# Patient Record
Sex: Male | Born: 1990 | Race: White | Hispanic: No | Marital: Single | Smoking: Current every day smoker
Health system: Southern US, Community
[De-identification: ages and names within clinical notes are randomized; demographics above are authoritative.]

## PROBLEM LIST (undated history)

## (undated) ENCOUNTER — Emergency Department (HOSPITAL_COMMUNITY): Admission: EM | Payer: Self-pay | Source: Home / Self Care

## (undated) DIAGNOSIS — F209 Schizophrenia, unspecified: Secondary | ICD-10-CM

## (undated) DIAGNOSIS — F1910 Other psychoactive substance abuse, uncomplicated: Secondary | ICD-10-CM

## (undated) DIAGNOSIS — F32A Depression, unspecified: Secondary | ICD-10-CM

## (undated) DIAGNOSIS — F419 Anxiety disorder, unspecified: Secondary | ICD-10-CM

## (undated) HISTORY — PX: OTHER SURGICAL HISTORY: SHX169

## (undated) HISTORY — DX: Anxiety disorder, unspecified: F41.9

## (undated) HISTORY — DX: Depression, unspecified: F32.A

---
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
## 2013-11-18 ENCOUNTER — Encounter (HOSPITAL_COMMUNITY): Payer: Self-pay | Admitting: Emergency Medicine

## 2013-11-18 ENCOUNTER — Emergency Department (HOSPITAL_COMMUNITY)
Admission: EM | Admit: 2013-11-18 | Discharge: 2013-11-18 | Disposition: A | Discharge status: Home / Self Care | Payer: Self-pay | Attending: Emergency Medicine | Admitting: Emergency Medicine

## 2013-11-18 DIAGNOSIS — Z8659 Personal history of other mental and behavioral disorders: Secondary | ICD-10-CM | POA: Insufficient documentation

## 2013-11-18 DIAGNOSIS — Z046 Encounter for general psychiatric examination, requested by authority: Secondary | ICD-10-CM

## 2013-11-18 DIAGNOSIS — F1994 Other psychoactive substance use, unspecified with psychoactive substance-induced mood disorder: Secondary | ICD-10-CM

## 2013-11-18 DIAGNOSIS — F1210 Cannabis abuse, uncomplicated: Secondary | ICD-10-CM | POA: Insufficient documentation

## 2013-11-18 DIAGNOSIS — F17200 Nicotine dependence, unspecified, uncomplicated: Secondary | ICD-10-CM | POA: Insufficient documentation

## 2013-11-18 DIAGNOSIS — F489 Nonpsychotic mental disorder, unspecified: Secondary | ICD-10-CM | POA: Insufficient documentation

## 2013-11-18 HISTORY — DX: Schizophrenia, unspecified: F20.9

## 2013-11-18 LAB — CBC
HCT: 42 % (ref 39.0–52.0)
Hemoglobin: 14.7 g/dL (ref 13.0–17.0)
MCH: 30 pg (ref 26.0–34.0)
MCHC: 35 g/dL (ref 30.0–36.0)
MCV: 85.7 fL (ref 78.0–100.0)
Platelets: 202 10*3/uL (ref 150–400)
RBC: 4.9 MIL/uL (ref 4.22–5.81)
RDW: 13.1 % (ref 11.5–15.5)
WBC: 6.4 10*3/uL (ref 4.0–10.5)

## 2013-11-18 LAB — COMPREHENSIVE METABOLIC PANEL
ALBUMIN: 4 g/dL (ref 3.5–5.2)
ALT: 14 U/L (ref 0–53)
AST: 20 U/L (ref 0–37)
Alkaline Phosphatase: 94 U/L (ref 39–117)
BUN: 18 mg/dL (ref 6–23)
CHLORIDE: 102 meq/L (ref 96–112)
CO2: 27 meq/L (ref 19–32)
Calcium: 9.2 mg/dL (ref 8.4–10.5)
Creatinine, Ser: 0.73 mg/dL (ref 0.50–1.35)
GLUCOSE: 88 mg/dL (ref 70–99)
POTASSIUM: 4 meq/L (ref 3.7–5.3)
SODIUM: 140 meq/L (ref 137–147)
TOTAL PROTEIN: 6.8 g/dL (ref 6.0–8.3)
Total Bilirubin: 0.6 mg/dL (ref 0.3–1.2)

## 2013-11-18 LAB — RAPID URINE DRUG SCREEN, HOSP PERFORMED
AMPHETAMINES: NOT DETECTED
Barbiturates: NOT DETECTED
Benzodiazepines: NOT DETECTED
COCAINE: NOT DETECTED
OPIATES: NOT DETECTED
Tetrahydrocannabinol: POSITIVE — AB

## 2013-11-18 LAB — ETHANOL: Alcohol, Ethyl (B): <11 mg/dL (ref 0–11)

## 2013-11-18 LAB — SALICYLATE LEVEL

## 2013-11-18 LAB — ACETAMINOPHEN LEVEL: Acetaminophen (Tylenol), Serum: <15 ug/mL (ref 10–30)

## 2013-11-18 MED ORDER — LORAZEPAM 1 MG PO TABS: 0.0000 mg | ORAL_TABLET | Freq: Two times a day (BID) | ORAL | Status: DC

## 2013-11-18 MED ORDER — LORAZEPAM 1 MG PO TABS: 0.0000 mg | ORAL_TABLET | Freq: Four times a day (QID) | ORAL | Status: DC

## 2013-11-18 NOTE — Progress Notes (Signed)
[facility] provided pt with a [employer] Orange Card application, highlighting [facility], to help patient establish primary care.  °

## 2013-11-18 NOTE — [facility] Suicide Risk Assessment (Signed)
Suicide Risk Assessment  Discharge Assessment     Demographic Factors:  Male, Adolescent or young adult and Caucasian  Total Time spent with patient: 45 minutes  Psychiatric Specialty Exam:     Blood pressure 127/75, pulse 90, temperature 97.2 F (36.2 C), temperature source Oral, resp. rate 16, SpO2 99.00%.There is no height or weight on file to calculate BMI.  General Appearance: Fairly Groomed  Patent attorneyye Contact::  Good  Speech:  Clear and Coherent  Volume:  Normal  Mood:  Anxious  Affect:  Congruent  Thought Process:  Coherent  Orientation:  Full (Time, Place, and Person)  Thought Content:  Negative  Suicidal Thoughts:  No  Homicidal Thoughts:  No  Memory:  Immediate;   Good Recent;   Good Remote;   Good  Judgement:  Good  Insight:  Fair  Psychomotor Activity:  Normal  Concentration:  Good  Recall:  Good  Fund of Knowledge:Good  Language: Good  Akathisia:  Negative  Handed:  Right  AIMS (if indicated):     Assets:  Manufacturing systems engineerCommunication Skills Physical Health Social Support Vocational/Educational  Sleep:       Musculoskeletal: Strength & Muscle Tone: within normal limits Gait & Station: normal Patient leans: N/A   Mental Status Per Nursing Assessment::   On Admission:     Current Mental Status by Physician: NA  Loss Factors: NA  Historical Factors: NA  Risk Reduction Factors:   NA  Continued Clinical Symptoms:  Alcohol/Substance Abuse/Dependencies  Cognitive Features That Contribute To Risk:  Closed-mindedness    Suicide Risk:  Minimal: No identifiable suicidal ideation.  Patients presenting with no risk factors but with morbid ruminations; may be classified as minimal risk based on the severity of the depressive symptoms  Discharge Diagnoses:   AXIS I:  Substance Induced Mood Disorder AXIS II:  Deferred AXIS III:   Past Medical History  Diagnosis Date  . Schizophrenia    AXIS IV:  economic problems, housing problems, occupational problems,  other psychosocial or environmental problems, problems related to legal system/crime and problems related to social environment AXIS V:  61-70 mild symptoms  Plan Of Care/Follow-up recommendations:  Activity:  resume usual activity Diet:  resume  usual diet  Is patient on multiple antipsychotic therapies at discharge:  No   Has Patient had three or more failed trials of antipsychotic monotherapy by history:  No  Recommended Plan for Multiple Antipsychotic Therapies: NA    Giovanni Bath D 11/18/2013, 4:36 PM

## 2013-11-18 NOTE — BH Assessment (Signed)
TTS contacted pt's mother for collateral information at the request of Shuvon Rankin,NP. TTS spoke with pt's mother who reports that she had patient commited because he is unemployed, does not have a place to stay, and continues to drink etoh, and make bad choices. Patient's mother reports that he can't return to live with her because CPS is involved and continues to drink. Pt's mother reports that patient needs help with getting medicaid and long term treatment.  Pt was discharged by Dr.Taylor and Shuvon Rankin,NP with resources.  Glorious PeachNajah Maelys Kinnick, MS, LCASA Assessment Counselor

## 2013-11-18 NOTE — ED Notes (Signed)
Pt IVC'ed.  IVC papers state: Recently, the respondent was kicked out of a facility on [address] where he had stayed while dealing with an alcohol abuse problem.  [name] had gone [facility] [address] [facility].  Last Friday, the respondent became very intoxicated after having ordered a substantial quantity of alcoholic beverages online.  After having been ordered by his grand parents from their residence, the respondent literally had no place to go.  Early this morning the respondent surfaced at his mother's house.  The respondent who has an infant child cannot take care of himself.  [name]  He is currently on probation.  He has been dx w/ schizophrenia, yet stopped taking his meds 2 wks ago.  [name] appears to be a danger to himself and possibly others.

## 2013-11-18 NOTE — Consult Note (Signed)
[facility] Face-to-Face Psychiatry Consult   Reason for Consult:  Was involuntarily for making bad decisions  Referring Physician:  ER MD  Marc Hart is an 23 y.o. male. Total Time spent with patient: 45 minutes  Assessment: AXIS I:  Substance Induced Mood Disorder AXIS II:  Deferred AXIS III:   Past Medical History  Diagnosis Date  . Schizophrenia    AXIS IV:  economic problems, housing problems, occupational problems, other psychosocial or environmental problems, problems related to legal system/crime and problems with primary support group AXIS V:  61-70 mild symptoms  Plan:  No evidence of imminent risk to self or others at present.    Subjective:   Marc Hart is a 23 y.o. male patient admitted with drinking and misbehaving when intoxicated.  HPI:  Mr Rouse says he does not know why he was committed.  Denies any suicidal threats or ideation, denies homicidal ideation or threats and denies any psychotic symptoms.  Says he was in prison for being caught selling drugs to an underground policeman and got out in January 2015.  He is currently on probation.  In prison he was diagnosed with schizophrenia and treated with Haldol.  He admits he was paranoid in prison and was hearing voices.  Since his release he says he has not heard any voices or been paranoid even without any medicine he says.  He has been drinking alcohol and gets into trouble making bad decisions while inebriated.  Apparently he cannot be around his child because of his behavior.  He cannot return to his mother's house or to any relative's house because of the drinking. He says he does not drink every day but on the weekend he drank a fifth in one day and yesterday he drank 12 beers. HPI Elements:   Location:  misbehavior when inebriated. Quality:  bad attitude when drinking. Severity:  drinks several times weekly but not every day he says. Timing:  cannot find a job since he got out of prison. Duration:  a month  or two. Context:  just out of prison.  Past Psychiatric History: Past Medical History  Diagnosis Date  . Schizophrenia     reports that he has been smoking.  He does not have any smokeless tobacco history on file. He reports that he drinks alcohol. He reports that he uses illicit drugs (Marijuana). History reviewed. No pertinent family history.         Allergies:  No Known Allergies  ACT Assessment Complete:  Yes:    Educational Status    Risk to Self: Risk to self Is patient at risk for suicide?: No, but patient needs Medical Clearance Substance abuse history and/or treatment for substance abuse?: Yes  Risk to Others:    Abuse:    Prior Inpatient Therapy:    Prior Outpatient Therapy:    Additional Information:                    Objective: Blood pressure 127/75, pulse 90, temperature 97.2 F (36.2 C), temperature source Oral, resp. rate 16, SpO2 99.00%.There is no height or weight on file to calculate BMI. Results for orders placed during the hospital encounter of 11/18/13 (from the past 72 hour(s))  URINE RAPID DRUG SCREEN (HOSP PERFORMED)     Status: Abnormal   Collection Time    11/18/13 10:12 AM      Result Value Ref Range   Opiates NONE DETECTED  NONE DETECTED   Cocaine NONE DETECTED    NONE DETECTED   Benzodiazepines NONE DETECTED  NONE DETECTED   Amphetamines NONE DETECTED  NONE DETECTED   Tetrahydrocannabinol POSITIVE (*) NONE DETECTED   Barbiturates NONE DETECTED  NONE DETECTED   Comment:            DRUG SCREEN FOR MEDICAL PURPOSES     ONLY.  IF CONFIRMATION IS NEEDED     FOR ANY PURPOSE, NOTIFY LAB     WITHIN 5 DAYS.                LOWEST DETECTABLE LIMITS     FOR URINE DRUG SCREEN     Drug Class       Cutoff (ng/mL)     Amphetamine      1000     Barbiturate      200     Benzodiazepine   200     Tricyclics       300     Opiates          300     Cocaine          300     THC              50  CBC     Status: None   Collection Time     11/18/13 10:18 AM      Result Value Ref Range   WBC 6.4  4.0 - 10.5 K/uL   RBC 4.90  4.22 - 5.81 MIL/uL   Hemoglobin 14.7  13.0 - 17.0 g/dL   HCT 42.0  39.0 - 52.0 %   MCV 85.7  78.0 - 100.0 fL   MCH 30.0  26.0 - 34.0 pg   MCHC 35.0  30.0 - 36.0 g/dL   RDW 13.1  11.5 - 15.5 %   Platelets 202  150 - 400 K/uL  COMPREHENSIVE METABOLIC PANEL     Status: None   Collection Time    11/18/13 10:18 AM      Result Value Ref Range   Sodium 140  137 - 147 mEq/L   Potassium 4.0  3.7 - 5.3 mEq/L   Chloride 102  96 - 112 mEq/L   CO2 27  19 - 32 mEq/L   Glucose, Bld 88  70 - 99 mg/dL   BUN 18  6 - 23 mg/dL   Creatinine, Ser 0.73  0.50 - 1.35 mg/dL   Calcium 9.2  8.4 - 10.5 mg/dL   Total Protein 6.8  6.0 - 8.3 g/dL   Albumin 4.0  3.5 - 5.2 g/dL   AST 20  0 - 37 U/L   ALT 14  0 - 53 U/L   Alkaline Phosphatase 94  39 - 117 U/L   Total Bilirubin 0.6  0.3 - 1.2 mg/dL   GFR calc non Af Amer >90  >90 mL/min   GFR calc Af Amer >90  >90 mL/min   Comment: (NOTE)     The eGFR has been calculated using the CKD EPI equation.     This calculation has not been validated in all clinical situations.     eGFR's persistently <90 mL/min signify possible Chronic Kidney     Disease.  ETHANOL     Status: None   Collection Time    11/18/13 10:18 AM      Result Value Ref Range   Alcohol, Ethyl (B) <11  0 - 11 mg/dL   Comment:              LOWEST DETECTABLE LIMIT FOR     SERUM ALCOHOL IS 11 mg/dL     FOR MEDICAL PURPOSES ONLY  SALICYLATE LEVEL     Status: Abnormal   Collection Time    11/18/13 10:18 AM      Result Value Ref Range   Salicylate Lvl <2.0 (*) 2.8 - 20.0 mg/dL  ACETAMINOPHEN LEVEL     Status: None   Collection Time    11/18/13 10:18 AM      Result Value Ref Range   Acetaminophen (Tylenol), Serum <15.0  10 - 30 ug/mL   Comment:            THERAPEUTIC CONCENTRATIONS VARY     SIGNIFICANTLY. A RANGE OF 10-30     ug/mL MAY BE AN EFFECTIVE     CONCENTRATION FOR MANY PATIENTS.     HOWEVER, SOME  ARE BEST TREATED     AT CONCENTRATIONS OUTSIDE THIS     RANGE.     ACETAMINOPHEN CONCENTRATIONS     >150 ug/mL AT 4 HOURS AFTER     INGESTION AND >50 ug/mL AT 12     HOURS AFTER INGESTION ARE     OFTEN ASSOCIATED WITH TOXIC     REACTIONS.   Labs are reviewed and are pertinent for presence of THC but not alcohol.  Current Facility-Administered Medications  Medication Dose Route Frequency Provider Last Rate Last Dose  . LORazepam (ATIVAN) tablet 0-4 mg  0-4 mg Oral 4 times per day Scott T Goldston, MD       Followed by  . [START ON 11/20/2013] LORazepam (ATIVAN) tablet 0-4 mg  0-4 mg Oral Q12H Scott T Goldston, MD       Current Outpatient Prescriptions  Medication Sig Dispense Refill  . HALOPERIDOL PO Take 1 tablet by mouth daily.      . Sertraline HCl (ZOLOFT PO) Take 1 tablet by mouth daily.        Psychiatric Specialty Exam:     Blood pressure 127/75, pulse 90, temperature 97.2 F (36.2 C), temperature source Oral, resp. rate 16, SpO2 99.00%.There is no height or weight on file to calculate BMI.  General Appearance: Fairly Groomed  Eye Contact::  Good  Speech:  Clear and Coherent  Volume:  Normal  Mood:  Anxious  Affect:  Congruent  Thought Process:  Coherent and Logical  Orientation:  Full (Time, Place, and Person)  Thought Content:  Negative  Suicidal Thoughts:  No  Homicidal Thoughts:  No  Memory:  Immediate;   Good Recent;   Good Remote;   Good  Judgement:  Intact  Insight:  Fair  Psychomotor Activity:  Normal  Concentration:  Good  Recall:  Good  Fund of Knowledge:Good  Language: Good  Akathisia:  Negative  Handed:  Right  AIMS (if indicated):     Assets:  Communication Skills Physical Health Social Support  Sleep:      Musculoskeletal: Strength & Muscle Tone: within normal limits Gait & Station: normal Patient leans: N/A  Treatment Plan Summary: does not meet criteria for involuntary commitment or admission or detox, so will discharge home and for  him likely to the homeless shelter.  There he needs to follow up with the [facility] to help him with a place to stay, job and applications for food stamps and medicaid if he qualifies  , D 11/18/2013 4:09 PM 

## 2013-11-18 NOTE — BH Assessment (Signed)
Dr.Taylor rescinded IVC and notice of change commitment form completed. Form was placed in medical records basket.  Marc PeachNajah Ardene Remley, MS, LCASA Assessment Counselor

## 2013-11-18 NOTE — ED Provider Notes (Signed)
CSN: [id number]     Arrival date & time 11/18/13  4098 History   First MD Initiated Contact with Patient 11/18/13 0957     Chief Complaint  Patient presents with  . Medical Clearance     (Consider location/radiation/quality/duration/timing/severity/associated sxs/prior Treatment) HPI Comments: 23 year old male presents in police custody in handcuffs with IVC papers. The patient states she's not know why he is here. He denies any current suicidal or homicidal thoughts. Patient states he has a history of depression and anxiety but denies a history of schizophrenia. The patient was recently kicked out of facility on [address] [facility] [facility] and currently does not have a place to live. The IVC papers were brought with police report that the patient's was kicked out of this facility for using alcohol. [facility]. When asked about this he states that "that's where they put me". The IVC fevers also mentioned schizophrenia, which the patient currently denies. On first attempt to call the mother, Veatrice Kells, who took out these IVC papers I was unable to to get anyone to pick up the phone.   Past Medical History  Diagnosis Date  . Schizophrenia    History reviewed. No pertinent past surgical history. History reviewed. No pertinent family history. History  Substance Use Topics  . Smoking status: Current Every Day Smoker  . Smokeless tobacco: Not on file  . Alcohol Use: Yes    Review of Systems  Psychiatric/Behavioral: Negative for suicidal ideas, hallucinations, self-injury and agitation.  All other systems reviewed and are negative.      Allergies  Review of patient's allergies indicates no known allergies.  Home Medications  No current outpatient prescriptions on file. BP 131/76  Pulse 96  Temp(Src) 97.7 F (36.5 C) (Oral)  Resp 18  SpO2 96% Physical Exam  Nursing note and vitals reviewed. Constitutional: He is  oriented to person, place, and time. He appears well-developed and well-nourished.  In handcuffs  HENT:  Head: Normocephalic and atraumatic.  Right Ear: External ear normal.  Left Ear: External ear normal.  Nose: Nose normal.  Eyes: Right eye exhibits no discharge. Left eye exhibits no discharge.  Neck: Neck supple.  Cardiovascular: Normal rate, regular rhythm, normal heart sounds and intact distal pulses.   Pulmonary/Chest: Effort normal.  Abdominal: Soft. There is no tenderness.  Neurological: He is alert and oriented to person, place, and time.  Skin: Skin is warm and dry.    ED Course  Procedures (including critical care time) Labs Review Labs Reviewed  SALICYLATE LEVEL - Abnormal; Notable for the following:    Salicylate Lvl <2.0 (*)    All other components within normal limits  URINE RAPID DRUG SCREEN (HOSP PERFORMED) - Abnormal; Notable for the following:    Tetrahydrocannabinol POSITIVE (*)    All other components within normal limits  CBC  COMPREHENSIVE METABOLIC PANEL  ETHANOL  ACETAMINOPHEN LEVEL   Imaging Review No results found.   EKG Interpretation None      MDM   Final diagnoses:  Involuntary commitment    I was able to get in contact with patient's mom, Veatrice Kells, who states that patient has not been taking his haldol for the past 2 weeks, and that she is worried he's going to hurt himself with alcohol and/or drugs. States he has no where to live. Concerned because CPS is looking into patient and that he is not allowed near his son, who the mother is  currently having custody of. Will consult psych.   Audree CamelScott T Ayad Nieman, MD 11/18/13 [phone]

## 2013-11-18 NOTE — Progress Notes (Signed)
RN spoke with pt mother and clarified that patient does not have custody of infant child. Pt mother is guardian for infant. Pt remains under IVC until psychiatric evaluation determines disposition.   Byrd HesselbachKristen Jacquis Paxton, LCSW [phone]  ED CSW 11/18/2013 11:18am

## 2013-11-18 NOTE — ED Notes (Signed)
Pt requested something to eat. Pt given Mal[place]key sandwich with cheese and a sprite. Pt ate all.  Pt now laying in bed with his eyes closed.

## 2014-12-03 ENCOUNTER — Encounter (HOSPITAL_COMMUNITY): Payer: Self-pay | Admitting: Emergency Medicine

## 2014-12-03 ENCOUNTER — Emergency Department (HOSPITAL_COMMUNITY)
Admission: EM | Admit: 2014-12-03 | Discharge: 2014-12-03 | Disposition: A | Discharge status: Home / Self Care | Payer: Self-pay | Attending: Emergency Medicine | Admitting: Emergency Medicine

## 2014-12-03 DIAGNOSIS — Z720 Tobacco use: Secondary | ICD-10-CM | POA: Insufficient documentation

## 2014-12-03 DIAGNOSIS — F1123 Opioid dependence with withdrawal: Secondary | ICD-10-CM | POA: Insufficient documentation

## 2014-12-03 HISTORY — DX: Other psychoactive substance abuse, uncomplicated: F19.10

## 2014-12-03 MED ORDER — IBUPROFEN 200 MG PO TABS
600.0000 mg | ORAL_TABLET | Freq: Once | ORAL | Status: AC
  Administered 2014-12-03: 600 mg via ORAL
  Filled 2014-12-03: qty 3

## 2014-12-03 MED ORDER — ONDANSETRON HCL 4 MG PO TABS: 4.0000 mg | ORAL_TABLET | Freq: Four times a day (QID) | ORAL | Status: DC

## 2014-12-03 MED ORDER — DIAZEPAM 5 MG PO TABS: 5.0000 mg | ORAL_TABLET | Freq: Three times a day (TID) | ORAL | Status: DC | PRN

## 2014-12-03 MED ORDER — DICYCLOMINE HCL 20 MG PO TABS: 20.0000 mg | ORAL_TABLET | Freq: Four times a day (QID) | ORAL | Status: DC | PRN

## 2014-12-03 MED ORDER — ONDANSETRON 4 MG PO TBDP
4.0000 mg | ORAL_TABLET | Freq: Once | ORAL | Status: AC
  Administered 2014-12-03: 4 mg via ORAL
  Filled 2014-12-03: qty 1

## 2014-12-03 MED ORDER — DIAZEPAM 5 MG PO TABS
5.0000 mg | ORAL_TABLET | Freq: Once | ORAL | Status: AC
  Administered 2014-12-03: 5 mg via ORAL
  Filled 2014-12-03: qty 1

## 2014-12-03 NOTE — ED Notes (Signed)
Per pt, want medical eval so he can get into [facility]-last used heroin last night

## 2014-12-03 NOTE — Discharge Instructions (Signed)
Finding Treatment for Alcohol and Drug Addiction °It can be hard to find the right place to get professional treatment. Here are some important things to consider: °· There are different types of treatment to choose from. °· Some programs are live-in (residential) while others are not (outpatient). Sometimes a combination is offered. °· No single type of program is right for everyone. °· Most treatment programs involve a combination of education, counseling, and a 12-step, spiritually-based approach. °· There are non-spiritually based programs (not 12-step). °· Some treatment programs are government sponsored. They are geared for patients without private insurance. °· Treatment programs can vary in many respects such as: °¨ Cost and types of insurance accepted. °¨ Types of on-site medical services offered. °¨ Length of stay, setting, and size. °¨ Overall philosophy of treatment.  °A person may need specialized treatment or have needs not addressed by all programs. For example, adolescents need treatment appropriate for their age. Other people have secondary disorders that must be managed as well. Secondary conditions can include mental illness, such as depression or diabetes. Often, a period of detoxification from alcohol or drugs is needed. This requires medical supervision and not all programs offer this. °THINGS TO CONSIDER WHEN SELECTING A TREATMENT PROGRAM  °· Is the program certified by the appropriate government agency? Even private programs must be certified and employ certified professionals. °· Does the program accept your insurance? If not, can a payment plan be set up? °· Is the facility clean, organized, and well run? Do they allow you to speak with graduates who can share their treatment experience with you? Can you tour the facility? Can you meet with staff? °· Does the program meet the full range of individual needs? °· Does the treatment program address sexual orientation and physical disabilities?  Do they provide age, gender, and culturally appropriate treatment services? °· Is treatment available in languages other than English? °· Is long-term aftercare support or guidance encouraged and provided? °· Is assessment of an individual's treatment plan ongoing to ensure it meets changing needs? °· Does the program use strategies to encourage reluctant patients to remain in treatment long enough to increase the likelihood of success? °· Does the program offer counseling (individual or group) and other behavioral therapies? °· Does the program offer medicine as part of the treatment regimen, if needed? °· Is there ongoing monitoring of possible relapse? Is there a defined relapse prevention program? Are services or referrals offered to family members to ensure they understand addiction and the recovery process? This would help them support the recovering individual. °· Are 12-step meetings held at the center or is transport available for patients to attend outside meetings? °In countries outside of the [place], see local directories for contact information for services in your area. °Document Released: 07/19/2005 Document Revised: 11/12/2011 Document Reviewed: 01/29/2008 °ExitCare® Patient Information ©2015 [employer]. This information is not intended to replace advice given to you by your health care provider. Make sure you discuss any questions you have with your health care provider. ° °Emergency Department Resource Guide °1) Find a Doctor and Pay Out of Pocket °Although you won't have to find out who is covered by your insurance plan, it is a good idea to ask around and get recommendations. You will then need to call the office and see if the doctor you have chosen will accept you as a new patient and what types of options they offer for patients who are self-pay. Some doctors offer discounts or will   set up payment plans for their patients who do not have insurance, but you will need to ask so you  aren't surprised when you get to your appointment. ° °2) Contact Your Local Health Department °Not all health departments have doctors that can see patients for sick visits, but many do, so it is worth a call to see if yours does. If you don't know where your local health department is, you can check in your phone book. The CDC also has a tool to help you locate your state's health department, and many state websites also have listings of all of their local health departments. ° °3) Find a Walk-in Clinic °If your illness is not likely to be very severe or complicated, you may want to try a walk in clinic. These are popping up all over the country in pharmacies, drugstores, and shopping centers. They're usually staffed by nurse practitioners or physician assistants that have been trained to treat common illnesses and complaints. They're usually fairly quick and inexpensive. However, if you have serious medical issues or chronic medical problems, these are probably not your best option. ° °No Primary Care Doctor: °- Call [facility] at  832-8000 - they can help you locate a primary care doctor that  accepts your insurance, provides certain services, etc. °- Physician Referral Service- 1-800-533-3463 ° °Chronic Pain Problems: °Organization         Address  Phone   Notes  °[facility]  (336) 297-2271 Patients need to be referred by their primary care doctor.  ° °Medication Assistance: °Organization         Address  Phone   Notes  °[place] [facility] [address] °[place], [place] [address] (336) 641-8030 --Must be a resident of [place] °-- Must have NO insurance coverage whatsoever (no Medicaid/ Medicare, etc.) °-- The pt. MUST have a primary care doctor that directs their care regularly and follows them in the community °  °[facility]  (866) 331-1348   °[employer]  (888) 892-1162   ° °Agencies that provide inexpensive medical care: °Organization          Address  Phone   Notes  °[facility]  (336) 832-8035   °[facility]    (336) 832-7272   °[facility] [address] °[place], [place] [address] (336) 832-4777   °[facility] [address], °[place] (336) 271-4999   °[facility]    (336) 373-0678   °[facility]    (336) 272-1050   °[facility] ° [address], [place] Phone:  (336) 832-4444, Fax:  (336) 832-4440 Hours of Operation:  9 am - 6 pm, M-F.  Also accepts Medicaid/Medicare and self-pay.  °[facility] ° [address], [place] Phone: (336) 832-3150, Fax: (336) 832-3151. Hours of Operation:  8:30 am - 5:30 pm, M-F.  Also accepts Medicaid and self-pay.  °[facility] [place] [address], [place] Phone: (336) 878-6027   °[facility] [address], [place], [place] (336)723-1848, Ext. 123 Mondays & Thursdays: 7-9 AM.  First 15 patients are seen on a first come, first serve basis. °  ° °Medicaid-accepting [place] Providers: ° °Organization         Address  Phone   Notes  °[facility] [address],  (336) 641-2100   Also accepts self-pay patients.  °[facility] [address], [place] ° (336) 856-9996   °[facility] [address], [place] (336) 288-8857   °[facility] [address] [place] [address], [place] (336) 299-7000   °Veita Bland [address], [place]  ° (336) 373-1557 Only accepts [place] Access Medicaid patients after they have their name applied to their card.  ° °Self-Pay (no insurance) in [place]: ° °Organization         Address  Phone   Notes  °Sickle Cell Patients, [facility] [address], [place] (336) 832-1970   °[facility] [address], [place] (336) 832-4400    °[facility] ° [address], [place] (336) 992-4800   °[facility]/Dr. Osei-Bonsu ° [address] [place] [address], [place] or [address], [place] (336) 841-8500 Phone number for both [place] and [place] locations is the same.  °[facility] [address], [place] (336) 299-0000   °[facility] [address] [place] [address], [place] or [address] (336) 852-7530 °(336) 878-2260   °[facility] [address], [place] (336) 350-1642, phone; (336) 294-5005, fax Sees patients 1st and 3rd Saturday of every month.  Must not qualify for public or private insurance (i.e. Medicaid, Medicare, [place] Health Choice, Veterans' Benefits) • Household income should be no more than 200% of the poverty level •The clinic cannot treat you if you are pregnant or think you are pregnant • Sexually transmitted diseases are not treated at the clinic.  ° ° °Dental Care: °Organization         Address  Phone  Notes  °[facility] [address], [place] (336) 641-6152 Accepts children up to age 21 who are enrolled in Medicaid or [employer] Health Choice; pregnant women with a Medicaid card; and children who have applied for Medicaid or [place] Health Choice, but were declined, whose parents can pay a reduced fee at time of service.  °[facility] [place]  [address], [place] (336) 641-7733 Accepts children up to age 21 who are enrolled in Medicaid or [place] Health Choice; pregnant women with a Medicaid card; and children who have applied for Medicaid or [place] Health Choice, but were declined, whose parents can pay a reduced fee at time of service.  °[facility] ° [address],  (336) 641-4533 Patients are seen by appointment only. Walk-ins are not accepted. [facility] will see patients 18  years of age and older. °Monday - Tuesday (8am-5pm) °Most Wednesdays (8:30-5pm) °$30 per visit, cash only  °[facility] ° [address], [place] (336) 641-4533 Patients are seen by appointment only. Walk-ins are not accepted. [facility] will see patients 18 years of age and older. °One Wednesday Evening (Monthly: Volunteer Based).  $30 per visit, cash only  °[facility]  (919) 537-3737 for adults; Children under age 4, call Graduate Pediatric Dentistry at (919) [phone]. Children aged 4-14, please call (919) [phone] to request a pediatric application. ° Dental services are provided in all areas of dental care including fillings, crowns and bridges, complete and partial dentures, implants, gum treatment, root canals, and extractions. Preventive care is also provided. Treatment   is provided to both adults and children. °Patients are selected via a lottery and there is often a waiting list. °  °[facility] [address], °[place] ° (336) 763-8833 [email or web address] °  °[facility] [address], [place], [place] (336)723-1848, Ext. 123 Second and Fourth Thursday of each month, opens at 6:30 AM; Clinic ends at 9 AM.  Patients are seen on a first-come first-served basis, and a limited number are seen during each clinic.  ° °[facility] ° [address], [place], [place] (336) 723-7904   Eligibility Requirements °You must have lived in [place], [place], or [place] counties for at least the last three months. °  You cannot be eligible for state or federal sponsored healthcare insurance, including Veterans Administration, Medicaid, or Medicare. °  You generally cannot be eligible for healthcare insurance through your employer.  °  How to apply: °Eligibility screenings are held every Tuesday and Wednesday afternoon from 1:00 pm until 4:00 pm. You do not need an appointment for the interview!  °[facility]  [address], [place], [place] 336-631-2330   °[facility]  336-342-8273   °[facility]  336-703-3100   °[facility]  336-570-6415   ° °Behavioral Health Resources in the Community: °Intensive Outpatient Programs °Organization         Address  Phone  Notes  °[facility] [address], [place], [place] 336-878-6098   °[facility] [address], [place], [place] 336-832-9800   °[facility] [address], [place], [place] ° 336-882-2125   °[facility] [address],  °[place], [place] 1-800-853-5163 or 336-641-4981   °Substance Abuse Resources °Organization         Address  Phone  Notes  °[facility]  336-882-2125   °[facility]  336-784-9470   °[facility]  336-285-9073   °[facility]  336-845-3988   °Residential & Outpatient Substance Abuse Program  1-800-659-3381   °Psychological Services °Organization         Address  Phone  Notes  °[facility]  336- 832-9600   °[facility]  336- 378-7881   °[facility] [address], [place] 1-800-853-5163 or 336-641-4981   ° °Mobile Crisis Teams °Organization         Address  Phone  Notes  °[facility], Mobile Crisis Care Unit  1-877-626-1772   °Assertive °[facility] ° [address] [place], [place] 336-834-9664   °Sharon DeEsch [address] °[place] 336-554-5454   ° °Self-Help/Support Groups °Organization         Address  Phone             Notes  °[facility] - variety of support groups  336- 373-1402 Call for more information  °Narcotics Anonymous (NA), [facility] [address], °[place]  2 meetings at this location  ° °Residential Treatment Programs °Organization         Address  Phone  Notes  °[facility] [address],    °[place]   1-866-801-8205   °[facility] ° [address], [place], [place] 704-293-8524   °[facility] [address], [place] 336-845-3988 Admissions: 8am-3pm M-F  °[facility] [address]    °[place],   [place] 336-841-1104   °[facility] [address], [place], [place] 336-379-7146   °[facility] [address]  °[place], [place] 336-285-9073   °[facility] Intensive Outpatient [address], [place], [place] 336-852-3033   °[facility] [address]  °[place], [place] 1-877-615-2722 or 336-784-9470   °[facility] [address] [place], [place] 336-227-7417 Accepts Medicaid  °[facility] [address]  °[place] 1-800-659-3381 Substance Abuse/Addiction Treatment  ° °[place] Behavioral Health Resources °Organization         Address  Phone  Notes  °[facility]  (888) 581-9988   °Julie Brannon, PhD [address] [place], [place]   (336) 349-5553 or (336) 951-0000   °[facility]   [address] °[place], [place] (336) 349-4454   °[facility] [address], [place], [place] (336) 342-8316 Insurance/Medicaid/sponsorship through [facility]  °[facility] [address]                                    [place], [place] (336) 342-8316 Therapy/tele-psych/case  °[facility] [address]  ° [place],  (336) 349-2233    °Dr. Arfeen  (336) 349-4544   °[facility] [place]  [employer] [place] [facility] 1) [address],  °2) [address], [place] °3)  [address], [place] (336) 349-3220 °(336) 342-7768 ° °(336) 342-8140   °[place] [facility] (336) 342-1394 or (336) 342-3537 (After Hours)    ° ° °

## 2014-12-13 NOTE — ED Provider Notes (Signed)
CSN: [id number]     Arrival date & time 12/03/14  0728 History   First MD Initiated Contact with Patient 12/03/14 [phone]     Chief Complaint  Patient presents with  . heroin detox      (Consider location/radiation/quality/duration/timing/severity/associated sxs/prior Treatment) HPI   24 year old male presented for medical evaluation. [facility]. They're requesting medical evaluation prior to this. He has no complaints otherwise. Last used opiates last night. Reports no significant past medical history otherwise. Thought hallucinations. Denies significant depression. No suicide or homicidal ideation.  Past Medical History  Diagnosis Date  . Schizophrenia   . Substance abuse    History reviewed. No pertinent past surgical history. No family history on file. History  Substance Use Topics  . Smoking status: Current Every Day Smoker  . Smokeless tobacco: Not on file  . Alcohol Use: Yes    Review of Systems  All systems reviewed and negative, other than as noted in HPI.   Allergies  Review of patient's allergies indicates no known allergies.  Home Medications   Prior to Admission medications   Medication Sig Start Date End Date Taking? Authorizing Provider  diazepam (VALIUM) 5 MG tablet Take 1 tablet (5 mg total) by mouth every 8 (eight) hours as needed for anxiety. 12/03/14   Raeford RazorStephen Zeina Akkerman, MD  dicyclomine (BENTYL) 20 MG tablet Take 1 tablet (20 mg total) by mouth every 6 (six) hours as needed for spasms. 12/03/14   Raeford RazorStephen Scotti Kosta, MD  ondansetron (ZOFRAN) 4 MG tablet Take 1 tablet (4 mg total) by mouth every 6 (six) hours. 12/03/14   Raeford RazorStephen Evania Lyne, MD   BP 131/55 mmHg  Pulse 63  Temp(Src) 97.5 F (36.4 C) (Oral)  Resp 16  SpO2 100% Physical Exam  Constitutional: He is oriented to person, place, and time. He appears well-developed and well-nourished. No distress.  Sitting in bed in the hall. No acute distress.  HENT:  Head:  Normocephalic and atraumatic.  Eyes: Conjunctivae are normal. Right eye exhibits no discharge. Left eye exhibits no discharge.  Neck: Neck supple.  Cardiovascular: Normal rate, regular rhythm and normal heart sounds.  Exam reveals no gallop and no friction rub.   No murmur heard. Pulmonary/Chest: Effort normal and breath sounds normal. No respiratory distress.  Abdominal: Soft. He exhibits no distension. There is no tenderness.  Musculoskeletal: He exhibits no edema or tenderness.  Neurological: He is alert and oriented to person, place, and time.  Skin: Skin is warm and dry.  Psychiatric: He has a normal mood and affect. His behavior is normal. Thought content normal.  Nursing note and vitals reviewed.   ED Course  Procedures (including critical care time) Labs Review Labs Reviewed - No data to display  Imaging Review No results found.   EKG Interpretation None      MDM   Final diagnoses:  Opioid dependence with withdrawal    24 year old male with opiate abuse/dependence. No acute complaints otherwise. Provide prescriptions for symptomatic treatment of his withdrawal symptoms outpatient follow-up for her heroin detox/rehabilitation.    Raeford RazorStephen Robynne Roat, MD 12/13/14 [phone]

## 2014-12-16 ENCOUNTER — Emergency Department (HOSPITAL_COMMUNITY)
Admission: EM | Admit: 2014-12-16 | Discharge: 2014-12-21 | Payer: Self-pay | Attending: Emergency Medicine | Admitting: Emergency Medicine

## 2014-12-16 ENCOUNTER — Encounter (HOSPITAL_COMMUNITY): Payer: Self-pay | Admitting: Emergency Medicine

## 2014-12-16 DIAGNOSIS — F259 Schizoaffective disorder, unspecified: Secondary | ICD-10-CM | POA: Diagnosis present

## 2014-12-16 DIAGNOSIS — Z720 Tobacco use: Secondary | ICD-10-CM | POA: Insufficient documentation

## 2014-12-16 DIAGNOSIS — Z8659 Personal history of other mental and behavioral disorders: Secondary | ICD-10-CM

## 2014-12-16 DIAGNOSIS — F39 Unspecified mood [affective] disorder: Secondary | ICD-10-CM | POA: Diagnosis present

## 2014-12-16 DIAGNOSIS — F209 Schizophrenia, unspecified: Secondary | ICD-10-CM | POA: Insufficient documentation

## 2014-12-16 LAB — COMPREHENSIVE METABOLIC PANEL
ALT: 16 U/L (ref 0–53)
AST: 22 U/L (ref 0–37)
Albumin: 4.8 g/dL (ref 3.5–5.2)
Alkaline Phosphatase: 92 U/L (ref 39–117)
Anion gap: 14 (ref 5–15)
BUN: 15 mg/dL (ref 6–23)
CO2: 22 mmol/L (ref 19–32)
CREATININE: 0.93 mg/dL (ref 0.50–1.35)
Calcium: 9.4 mg/dL (ref 8.4–10.5)
Chloride: 101 mmol/L (ref 96–112)
GFR calc Af Amer: >90 mL/min (ref >90)
Glucose, Bld: 92 mg/dL (ref 70–99)
Potassium: 4.3 mmol/L (ref 3.5–5.1)
Sodium: 137 mmol/L (ref 135–145)
Total Bilirubin: 2.4 mg/dL — ABNORMAL HIGH (ref 0.3–1.2)
Total Protein: 7.7 g/dL (ref 6.0–8.3)

## 2014-12-16 LAB — CBC
HCT: 50.2 % (ref 39.0–52.0)
Hemoglobin: 17.4 g/dL — ABNORMAL HIGH (ref 13.0–17.0)
MCH: 29.9 pg (ref 26.0–34.0)
MCHC: 34.7 g/dL (ref 30.0–36.0)
MCV: 86.3 fL (ref 78.0–100.0)
Platelets: 238 10*3/uL (ref 150–400)
RBC: 5.82 MIL/uL — ABNORMAL HIGH (ref 4.22–5.81)
RDW: 13.8 % (ref 11.5–15.5)
WBC: 9.7 10*3/uL (ref 4.0–10.5)

## 2014-12-16 LAB — ACETAMINOPHEN LEVEL

## 2014-12-16 LAB — SALICYLATE LEVEL: Salicylate Lvl: <4 mg/dL (ref 2.8–20.0)

## 2014-12-16 LAB — ETHANOL

## 2014-12-16 MED ORDER — ALUM & MAG HYDROXIDE-SIMETH 200-200-20 MG/5ML PO SUSP
30.0000 mL | ORAL | Status: DC | PRN
  Filled 2014-12-16: qty 30

## 2014-12-16 MED ORDER — AMMONIA AROMATIC IN INHA
RESPIRATORY_TRACT | Status: AC
  Filled 2014-12-16: qty 50

## 2014-12-16 MED ORDER — ZOLPIDEM TARTRATE 5 MG PO TABS: 5.0000 mg | ORAL_TABLET | Freq: Every evening | ORAL | Status: DC | PRN

## 2014-12-16 MED ORDER — IBUPROFEN 200 MG PO TABS
600.0000 mg | ORAL_TABLET | Freq: Three times a day (TID) | ORAL | Status: DC | PRN
  Administered 2014-12-19: 600 mg via ORAL
  Filled 2014-12-16: qty 3

## 2014-12-16 MED ORDER — LORAZEPAM 1 MG PO TABS
1.0000 mg | ORAL_TABLET | Freq: Three times a day (TID) | ORAL | Status: DC | PRN
  Administered 2014-12-17 – 2014-12-20 (×6): 1 mg via ORAL
  Filled 2014-12-16 (×6): qty 1

## 2014-12-16 MED ORDER — ONDANSETRON HCL 4 MG PO TABS: 4.0000 mg | ORAL_TABLET | Freq: Three times a day (TID) | ORAL | Status: DC | PRN

## 2014-12-16 MED ORDER — NICOTINE 21 MG/24HR TD PT24
21.0000 mg | MEDICATED_PATCH | Freq: Every day | TRANSDERMAL | Status: DC
  Administered 2014-12-18 – 2014-12-21 (×4): 21 mg via TRANSDERMAL
  Filled 2014-12-16 (×4): qty 1

## 2014-12-16 MED ORDER — ACETAMINOPHEN 325 MG PO TABS
650.0000 mg | ORAL_TABLET | ORAL | Status: DC | PRN
  Administered 2014-12-18: 650 mg via ORAL
  Filled 2014-12-16: qty 2

## 2014-12-16 NOTE — ED Provider Notes (Signed)
CSN: [id number]     Arrival date & time 12/16/14  1837 History   First MD Initiated Contact with Patient 12/16/14 2050     Chief Complaint  Patient presents with  . Found wandering   . Unknown      (Consider location/radiation/quality/duration/timing/severity/associated sxs/prior Treatment) HPI According to EMS patient was brought in after being convinced to come to the emergency room. EMS reports patient was wandering in the middle of the street, walking in front of cars, refusing to answer any questions to police or EMS. According to EMS patient is fully alert, however acting oppositional, would not answer any questions, however agreed to be transported to the emergency room. During interview, patient does not answer any of my questions except for what he was doing earlier, patient states he was "just walking in the road". Patient cannot give me any answer as to why he was walking the road, does not answer as to how or why he was brought here. It is very difficult to make eye contact with patient, and patient easily distractible. Patient denies having any specific complaints to me. Patient denies suicidal or homicidal ideation  Past Medical History  Diagnosis Date  . Schizophrenia   . Substance abuse    No past surgical history on file. No family history on file. History  Substance Use Topics  . Smoking status: Current Every Day Smoker  . Smokeless tobacco: Not on file  . Alcohol Use: Yes    Review of Systems  Constitutional: Negative for fever.  HENT: Negative for trouble swallowing.   Eyes: Negative for visual disturbance.  Respiratory: Negative for shortness of breath.   Cardiovascular: Negative for chest pain.  Gastrointestinal: Negative for nausea, vomiting and abdominal pain.  Genitourinary: Negative for dysuria.  Musculoskeletal: Negative for neck pain.  Skin: Negative for rash.  Neurological: Negative for dizziness, weakness and numbness.  Psychiatric/Behavioral:  Positive for behavioral problems. Negative for suicidal ideas.      Allergies  Review of patient's allergies indicates no known allergies.  Home Medications   Prior to Admission medications   Medication Sig Start Date End Date Taking? Authorizing Provider  diazepam (VALIUM) 5 MG tablet Take 1 tablet (5 mg total) by mouth every 8 (eight) hours as needed for anxiety. 12/03/14   Raeford Razor, MD  dicyclomine (BENTYL) 20 MG tablet Take 1 tablet (20 mg total) by mouth every 6 (six) hours as needed for spasms. 12/03/14   Raeford Razor, MD  ondansetron (ZOFRAN) 4 MG tablet Take 1 tablet (4 mg total) by mouth every 6 (six) hours. 12/03/14   Raeford Razor, MD   BP 138/82 mmHg  Pulse 102  Temp(Src) 99 F (37.2 C) (Oral)  Resp 20  SpO2 96% Physical Exam  Constitutional: He is oriented to person, place, and time. He appears well-developed and well-nourished. No distress.  HENT:  Head: Normocephalic and atraumatic.  Mouth/Throat: Oropharynx is clear and moist. No oropharyngeal exudate.  Eyes: Right eye exhibits no discharge. Left eye exhibits no discharge. No scleral icterus.  Neck: Normal range of motion.  Cardiovascular: Normal rate, regular rhythm and normal heart sounds.   No murmur heard. Pulmonary/Chest: Effort normal and breath sounds normal. No respiratory distress.  Abdominal: Soft. There is no tenderness.  Musculoskeletal: Normal range of motion. He exhibits no edema or tenderness.  Neurological: He is alert and oriented to person, place, and time. No cranial nerve deficit. Coordination normal.  Skin: Skin is warm and dry. No rash noted. He is  not diaphoretic.  Psychiatric:  Patient's mood is depressed with affect flat. Speech is delayed, patient will only answer yes or no questions. Behavior is withdrawn, slightly inattentive, easily distracted. Patient does not appear to be responding to any internal stimuli. Thought content is difficult to assess due to his not being communicative.  Judgment and insight are impaired.    ED Course  Procedures (including critical care time) Labs Review Labs Reviewed  ACETAMINOPHEN LEVEL - Abnormal; Notable for the following:    Acetaminophen (Tylenol), Serum <10.0 (*)    All other components within normal limits  CBC - Abnormal; Notable for the following:    RBC 5.82 (*)    Hemoglobin 17.4 (*)    All other components within normal limits  COMPREHENSIVE METABOLIC PANEL - Abnormal; Notable for the following:    Total Bilirubin 2.4 (*)    All other components within normal limits  ETHANOL  SALICYLATE LEVEL  URINE RAPID DRUG SCREEN (HOSP PERFORMED)    Imaging Review No results found.   EKG Interpretation None      MDM   Final diagnoses:  History of schizophrenia    Patient here brought by his police after being IVC by father. On my examination patient is withdrawn, difficult to speak to, generally nonverbal, will answer some yes or no questions. Given history of schizophrenia and substance abuse, and patient's unremarkable workup here today with reassuring labs, doubt encephalopathy, likely patient scheduled like behaviors are attributed to his psychiatric conditions versus organic. We'll consult TTS and place patient on psych hold.  BP 138/82 mmHg  Pulse 102  Temp(Src) 99 F (37.2 C) (Oral)  Resp 20  SpO2 96%  Signed,  Anjolaoluwa Siguenza, PA-C 12:37 AM     LadLadona Mowona MowJoe Nicolai Labonte, PA-C 12/17/14 0037  Tilden FossaElizabeth Rees, MD 12/17/14 [id number]

## 2014-12-16 NOTE — ED Notes (Signed)
Pt was found wandering in the middle [address].  Will not speak to staff/[employer]/or EMS.  Pt walking in front of cars.  Pt was found with discharge papers from [facility] this morning.  D/c with "potential for altered mental status" and "oppositional behavior".

## 2014-12-16 NOTE — ED Notes (Signed)
Pt ambulatory from triage, not interactive with staff, laying on stretcher with back turned toward staff, staring at wall, not answering questions.  Awake, alert & responsive, no distress noted.  Pt IVCed by father.  [facility], now homeless.  Monitoring for safety, Q 15 min checks in effect.
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
## 2014-12-16 NOTE — ED Notes (Signed)
Bed: [facility] Expected date:  Expected time:  Means of arrival:  Comments: EMS-wandered

## 2014-12-16 NOTE — ED Notes (Signed)
Pt's dad is here. He states that [facility] [place] and only stayed about 4 hours, that's how [place] ended [place] in [place] area, he states that he's been doing heroin and crack and needs help. He has been given instruction on IVC paperwork and is talking to [employer] now

## 2014-12-17 DIAGNOSIS — F39 Unspecified mood [affective] disorder: Secondary | ICD-10-CM | POA: Diagnosis present

## 2014-12-17 DIAGNOSIS — Z8659 Personal history of other mental and behavioral disorders: Secondary | ICD-10-CM | POA: Insufficient documentation

## 2014-12-17 MED ORDER — ZIPRASIDONE MESYLATE 20 MG IM SOLR
20.0000 mg | Freq: Once | INTRAMUSCULAR | Status: AC
  Administered 2014-12-17: 20 mg via INTRAMUSCULAR
  Filled 2014-12-17: qty 20

## 2014-12-17 MED ORDER — OLANZAPINE 10 MG PO TBDP
10.0000 mg | ORAL_TABLET | Freq: Two times a day (BID) | ORAL | Status: DC
  Administered 2014-12-17 – 2014-12-19 (×3): 10 mg via ORAL
  Filled 2014-12-17 (×4): qty 1

## 2014-12-17 MED ORDER — LORAZEPAM 2 MG/ML IJ SOLN
2.0000 mg | Freq: Once | INTRAMUSCULAR | Status: AC
  Administered 2014-12-17: 2 mg via INTRAMUSCULAR
  Filled 2014-12-17: qty 1

## 2014-12-17 MED ORDER — OLANZAPINE 10 MG PO TBDP
10.0000 mg | ORAL_TABLET | Freq: Three times a day (TID) | ORAL | Status: DC | PRN
  Administered 2014-12-18 – 2014-12-21 (×4): 10 mg via ORAL
  Filled 2014-12-17 (×4): qty 1

## 2014-12-17 MED ORDER — DIPHENHYDRAMINE HCL 50 MG/ML IJ SOLN
50.0000 mg | Freq: Once | INTRAMUSCULAR | Status: AC
  Administered 2014-12-17: 50 mg via INTRAMUSCULAR
  Filled 2014-12-17: qty 1

## 2014-12-17 NOTE — ED Notes (Signed)
Pt AAO x 3, no distress noted.  Calm & cooperative at present.  Requesting meds for increasing anxiety at present.  Ativan given.  Monitoring for safety, Q 15 min checks in effect at present.
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
## 2014-12-17 NOTE — ED Notes (Signed)
Pt is more calm and is responding to questions by Clinical research associatewriter. Writer agreed to begin releasing restraints by releasing the right foot and will continue to release restraints over the next hour depending on his mental states and safety

## 2014-12-17 NOTE — Progress Notes (Signed)
Per Junious Dresseronnie pt on [facility].   Olga CoasterKristen Adalis Gatti, LCSW  Clinical Social Work  [facility] [phone]

## 2014-12-17 NOTE — ED Notes (Signed)
Patient is sleeping at this tme

## 2014-12-17 NOTE — ED Notes (Signed)
Left ankle free from restraint. No issues

## 2014-12-17 NOTE — ED Notes (Signed)
Patient is requesting Hepatitis testing.  States partner was just diagnosed "hep C positive" and he reports history of IV drug use. Instructed to infor MD/provider in the am.
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
## 2014-12-17 NOTE — ED Notes (Signed)
Restraints removed and pt is calm at the moment. Will continue to monitor closely

## 2014-12-18 DIAGNOSIS — F39 Unspecified mood [affective] disorder: Secondary | ICD-10-CM

## 2014-12-18 DIAGNOSIS — F259 Schizoaffective disorder, unspecified: Secondary | ICD-10-CM | POA: Diagnosis present

## 2014-12-18 DIAGNOSIS — Z8659 Personal history of other mental and behavioral disorders: Secondary | ICD-10-CM

## 2014-12-18 MED ORDER — ZIPRASIDONE MESYLATE 20 MG IM SOLR
20.0000 mg | Freq: Once | INTRAMUSCULAR | Status: AC
  Administered 2014-12-18: 20 mg via INTRAMUSCULAR
  Filled 2014-12-18: qty 20

## 2014-12-18 MED ORDER — LORAZEPAM 2 MG/ML IJ SOLN
2.0000 mg | Freq: Once | INTRAMUSCULAR | Status: AC
  Administered 2014-12-18: 2 mg via INTRAMUSCULAR
  Filled 2014-12-18: qty 1

## 2014-12-18 MED ORDER — DIPHENHYDRAMINE HCL 50 MG/ML IJ SOLN
50.0000 mg | Freq: Once | INTRAMUSCULAR | Status: AC
  Administered 2014-12-18: 50 mg via INTRAMUSCULAR
  Filled 2014-12-18: qty 1

## 2014-12-18 NOTE — Progress Notes (Signed)
Per Coralee NorthNina, [facility].  Chad CordialLauren Carter, LCSWA 12/18/2014 8:40 AM
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
## 2014-12-18 NOTE — Progress Notes (Addendum)
[facility] Assessment Progress Note  Per Hassell DoneBobby Gilchrist at [facility], pt is still on the waiting list.  Marcelle SmilingSamantha Mavric Cortright, MS Counselor
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
## 2014-12-19 DIAGNOSIS — F259 Schizoaffective disorder, unspecified: Secondary | ICD-10-CM | POA: Insufficient documentation

## 2014-12-19 MED ORDER — DIPHENHYDRAMINE HCL 50 MG/ML IJ SOLN
50.0000 mg | Freq: Once | INTRAMUSCULAR | Status: AC
  Administered 2014-12-19: 50 mg via INTRAMUSCULAR
  Filled 2014-12-19: qty 1

## 2014-12-19 MED ORDER — HALOPERIDOL LACTATE 5 MG/ML IJ SOLN
5.0000 mg | Freq: Once | INTRAMUSCULAR | Status: AC
  Administered 2014-12-19: 5 mg via INTRAMUSCULAR
  Filled 2014-12-19: qty 1

## 2014-12-19 MED ORDER — BENZTROPINE MESYLATE 1 MG PO TABS
1.0000 mg | ORAL_TABLET | Freq: Two times a day (BID) | ORAL | Status: DC
  Filled 2014-12-19 (×2): qty 1

## 2014-12-19 MED ORDER — LORAZEPAM 2 MG/ML IJ SOLN
2.0000 mg | Freq: Once | INTRAMUSCULAR | Status: AC
  Administered 2014-12-19: 2 mg via INTRAMUSCULAR
  Filled 2014-12-19: qty 1

## 2014-12-19 MED ORDER — HALOPERIDOL 5 MG PO TABS
5.0000 mg | ORAL_TABLET | Freq: Two times a day (BID) | ORAL | Status: DC
  Filled 2014-12-19 (×2): qty 1
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
## 2014-12-19 NOTE — ED Notes (Signed)
Patient cooperative at present. Reports on going generalized pain. Denies SI, HI, AVH at present. Reports earlier passive SI and VH "seeing air waves moving". States anxiety has improved with Ativan. Rates anxiety at 7/10, states that his feelings of depression are varying.  Reports that he does not like Haldol effects and does not want to take it. States Zyprexa and Ativan are fine.  Encouragement offered.   Q 15 safety checks continue.
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
## 2014-12-20 DIAGNOSIS — F259 Schizoaffective disorder, unspecified: Secondary | ICD-10-CM

## 2014-12-20 LAB — HEPATITIS PANEL, ACUTE
HCV AB: NEGATIVE
HEP B S AG: NEGATIVE
Hep A IgM: NONREACTIVE
Hep B C IgM: NONREACTIVE

## 2014-12-20 MED ORDER — QUETIAPINE FUMARATE 50 MG PO TABS
50.0000 mg | ORAL_TABLET | Freq: Three times a day (TID) | ORAL | Status: DC
  Administered 2014-12-20: 50 mg via ORAL
  Filled 2014-12-20: qty 1

## 2014-12-20 MED ORDER — QUETIAPINE FUMARATE 100 MG PO TABS
100.0000 mg | ORAL_TABLET | Freq: Every day | ORAL | Status: DC
  Administered 2014-12-20: 100 mg via ORAL
  Filled 2014-12-20: qty 1
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
## 2014-12-20 NOTE — ED Notes (Signed)
Pt refused Haldol stating it makes him too shaky and not feel well. States he would take his prn zyprexa instead. Discussed with NP and she agrees to give prn zyprexa at his request

## 2014-12-20 NOTE — ED Notes (Signed)
Pt pacing hallway and looking sleepy. Was observed veering into the wall as if using it to help him stand. Writer asked pt to go to room and try to get some rest, but pt would not respond to Clinical research associatewriter. When writer and [employer] placed hands on pt to assist him, he essentially went limp as if passively resisting. Writer and [employer] escorted pt to his room and placed him in bed.

## 2014-12-20 NOTE — ED Notes (Signed)
Patient calm, cooperative at present. Denies SI, HI, AVH. Reports one episode of passive SI Monday morning.   Encouragement offered.  Q 15 safety checks continue.
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
## 2014-12-21 DIAGNOSIS — F39 Unspecified mood [affective] disorder: Secondary | ICD-10-CM

## 2014-12-21 DIAGNOSIS — Z8659 Personal history of other mental and behavioral disorders: Secondary | ICD-10-CM

## 2014-12-21 DIAGNOSIS — F259 Schizoaffective disorder, unspecified: Secondary | ICD-10-CM

## 2014-12-21 MED ORDER — AMANTADINE HCL 100 MG PO CAPS
100.0000 mg | ORAL_CAPSULE | Freq: Two times a day (BID) | ORAL | Status: DC
  Administered 2014-12-21: 100 mg via ORAL
  Filled 2014-12-21 (×2): qty 1

## 2014-12-21 MED ORDER — TRAZODONE HCL 100 MG PO TABS: 100.0000 mg | ORAL_TABLET | Freq: Every day | ORAL | Status: DC

## 2014-12-21 MED ORDER — FLUPHENAZINE HCL 5 MG PO TABS
5.0000 mg | ORAL_TABLET | Freq: Every day | ORAL | Status: DC
  Filled 2014-12-21: qty 1
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
## 2015-01-29 ENCOUNTER — Emergency Department (HOSPITAL_COMMUNITY)
Admission: EM | Admit: 2015-01-29 | Discharge: 2015-01-29 | Disposition: A | Discharge status: Home / Self Care | Payer: Self-pay | Attending: Emergency Medicine | Admitting: Emergency Medicine

## 2015-01-29 ENCOUNTER — Emergency Department (HOSPITAL_COMMUNITY): Payer: Self-pay

## 2015-01-29 ENCOUNTER — Encounter (HOSPITAL_COMMUNITY): Payer: Self-pay | Admitting: Emergency Medicine

## 2015-01-29 DIAGNOSIS — S62396A Other fracture of fifth metacarpal bone, right hand, initial encounter for closed fracture: Secondary | ICD-10-CM | POA: Insufficient documentation

## 2015-01-29 DIAGNOSIS — Y9389 Activity, other specified: Secondary | ICD-10-CM | POA: Insufficient documentation

## 2015-01-29 DIAGNOSIS — Z8659 Personal history of other mental and behavioral disorders: Secondary | ICD-10-CM | POA: Insufficient documentation

## 2015-01-29 DIAGNOSIS — Z720 Tobacco use: Secondary | ICD-10-CM | POA: Insufficient documentation

## 2015-01-29 DIAGNOSIS — S62306A Unspecified fracture of fifth metacarpal bone, right hand, initial encounter for closed fracture: Secondary | ICD-10-CM

## 2015-01-29 DIAGNOSIS — Y929 Unspecified place or not applicable: Secondary | ICD-10-CM | POA: Insufficient documentation

## 2015-01-29 DIAGNOSIS — Y998 Other external cause status: Secondary | ICD-10-CM | POA: Insufficient documentation
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
## 2015-01-29 NOTE — Discharge Instructions (Signed)
Boxer's Fracture °Boxer's fracture is a broken bone (fracture) of the fourth or fifth metacarpal (ring or pinky finger). The metacarpal bones connect the wrist to the fingers and make up the arch of the hand. Boxer's fracture occurs toward the body (proximal) from the first knuckle. This injury is known as a boxer's fracture, because it often occurs from hitting an object with a closed fist. °SYMPTOMS  °· Severe pain at the time of injury. °· Pain and swelling around the first knuckle on the fourth or fifth finger. °· Bruising (contusion) in the area within 48 hours of injury. °· Visible deformity, such as a pushed down knuckle. This can occur if the fracture is complete, and the bone fragments separate enough to distort normal body shape. °· Numbness or paralysis from swelling in the hand, causing pressure on the blood vessels or nerves (uncommon). °CAUSES  °· Direct injury (trauma), such as a striking blow with the fist. °· Indirect stress to the hand, such as twisting or violent muscle contraction (uncommon). °RISK INCREASES WITH: °· Punching an object with an unprotected knuckle. °· Contact sports (football, rugby). °· Sports that require hitting (boxing, martial arts). °· History of bone or joint disease (osteoporosis). °PREVENTION °· Maintain physical fitness: °¨ Muscle strength and flexibility. °¨ Endurance. °¨ Cardiovascular fitness. °· For participation in contact sports, wear proper protective equipment for the hand and make sure it fits properly. °· Learn and use proper technique when hitting or punching. °PROGNOSIS  °When proper treatment is given, to ensure normal alignment of the bones, healing can usually be expected in 4 to 6 weeks. Occasionally, surgery is necessary.  °RELATED COMPLICATIONS  °· Bone does not heal back together (nonunion). °· Bone heals together in an improper position (malunion), causing twisting of the finger when making a fist. °· Chronic pain, stiffness, or swelling of the  hand. °· Excessive bleeding in the hand, causing pressure and injury to nerves and blood vessels (rare). °· Stopping of normal hand growth in children. °· Infection of the wound, if skin is broken over the fracture (open fracture), or at the incision site if surgery is performed. °· Shortening of injured bones. °· Bony bump (prominence) in the palm or loss of shape of the knuckles. °· Pain and weakness when gripping. °· Arthritis of the affected joint, if the fracture goes into the joint, after repeated injury, or after delayed treatment. °· Scarring around the knuckle, and limited motion. °TREATMENT  °Treatment varies, depending on the injury. The place in the hand where the injury occurs has a great deal of motion, which allows the hand to move properly. If the fracture is not aligned properly, this function may be decreased. If the bone ends are in proper alignment, treatment first involves ice and elevation of the injured hand, at or above heart level, to reduce inflammation. Pain medicines help to relieve pain. Treatment also involves restraint by splinting, bandaging, casting, or bracing for 4 or more weeks.  °If the fracture is out of alignment (displaced), or it involves the joint, surgery is usually recommended. Surgery typically involves cutting through the skin to place removable pins, screws, and sometimes plates over the fracture. After surgery, the bone and joint are restrained for 4 or more weeks. After restraint (with or without surgery), stretching and strengthening exercises are needed to regain proper strength and function of the joint. Exercises may be done at home or with the assistance of a therapist. Depending on the sport and position played, a   brace or splint may be recommended when first returning to sports.  MEDICATION   If pain medication is necessary, nonsteroidal anti-inflammatory medications, such as aspirin and ibuprofen, or other minor pain relievers, such as acetaminophen, are  often recommended.  Do not take pain medication for 7 days before surgery.  Prescription pain relievers may be necessary. Use only as directed and only as much as you need. COLD THERAPY Cold treatment (icing) relieves pain and reduces inflammation. Cold treatment should be applied for 10 to 15 minutes every 2 to 3 hours for inflammation and pain, and immediately after any activity that aggravates your symptoms. Use ice packs or an ice massage. SEEK MEDICAL CARE IF:   Pain, tenderness, or swelling gets worse, despite treatment.  You experience pain, numbness, or coldness in the hand.  Blue, gray, or dark color appears in the fingernails.  You develop signs of infection, after surgery (fever, increased pain, swelling, redness, drainage of fluids, or bleeding in the surgical area).  You feel you have reinjured the hand.  New, unexplained symptoms develop. (Drugs used in treatment may produce side effects.) Document Released: 08/20/2005 Document Revised: 11/12/2011 Document Reviewed: 12/02/2008 [facility] Patient Information 2015 [employer]. This information is not intended to replace advice given to you by your health care provider. Make sure you discuss any questions you have with your health care provider. Seasonal fracture A show signs of calcification.  No splint was placed, but you have been referred to orthopedic hand surgery for evaluation
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
## 2015-03-16 ENCOUNTER — Encounter (HOSPITAL_COMMUNITY): Payer: Self-pay | Admitting: Emergency Medicine

## 2015-03-16 ENCOUNTER — Emergency Department (HOSPITAL_COMMUNITY)
Admission: EM | Admit: 2015-03-16 | Discharge: 2015-03-17 | Disposition: A | Discharge status: Home / Self Care | Payer: Self-pay | Attending: Emergency Medicine | Admitting: Emergency Medicine

## 2015-03-16 DIAGNOSIS — F259 Schizoaffective disorder, unspecified: Secondary | ICD-10-CM | POA: Diagnosis present

## 2015-03-16 DIAGNOSIS — Z720 Tobacco use: Secondary | ICD-10-CM | POA: Insufficient documentation

## 2015-03-16 DIAGNOSIS — F1910 Other psychoactive substance abuse, uncomplicated: Secondary | ICD-10-CM | POA: Insufficient documentation

## 2015-03-16 DIAGNOSIS — F29 Unspecified psychosis not due to a substance or known physiological condition: Secondary | ICD-10-CM | POA: Insufficient documentation

## 2015-03-16 LAB — COMPREHENSIVE METABOLIC PANEL
ALBUMIN: 5.1 g/dL — AB (ref 3.5–5.0)
ALK PHOS: 94 U/L (ref 38–126)
ALT: 28 U/L (ref 17–63)
ANION GAP: 13 (ref 5–15)
AST: 68 U/L — AB (ref 15–41)
BUN: 19 mg/dL (ref 6–20)
CALCIUM: 9.6 mg/dL (ref 8.9–10.3)
CO2: 22 mmol/L (ref 22–32)
CREATININE: 0.93 mg/dL (ref 0.61–1.24)
Chloride: 107 mmol/L (ref 101–111)
Glucose, Bld: 101 mg/dL — ABNORMAL HIGH (ref 65–99)
Potassium: 3.5 mmol/L (ref 3.5–5.1)
Sodium: 142 mmol/L (ref 135–145)
TOTAL PROTEIN: 8 g/dL (ref 6.5–8.1)
Total Bilirubin: 2.4 mg/dL — ABNORMAL HIGH (ref 0.3–1.2)

## 2015-03-16 LAB — CBC
HCT: 46.4 % (ref 39.0–52.0)
Hemoglobin: 16.5 g/dL (ref 13.0–17.0)
MCH: 29.5 pg (ref 26.0–34.0)
MCHC: 35.6 g/dL (ref 30.0–36.0)
MCV: 83 fL (ref 78.0–100.0)
PLATELETS: 191 10*3/uL (ref 150–400)
RBC: 5.59 MIL/uL (ref 4.22–5.81)
RDW: 12.7 % (ref 11.5–15.5)
WBC: 6.9 10*3/uL (ref 4.0–10.5)

## 2015-03-16 LAB — ACETAMINOPHEN LEVEL: Acetaminophen (Tylenol), Serum: <10 ug/mL — ABNORMAL LOW (ref 10–30)

## 2015-03-16 LAB — ETHANOL: Alcohol, Ethyl (B): 14 mg/dL — ABNORMAL HIGH (ref <5)

## 2015-03-16 LAB — SALICYLATE LEVEL: Salicylate Lvl: <4 mg/dL (ref 2.8–30.0)

## 2015-03-16 MED ORDER — NICOTINE 21 MG/24HR TD PT24: 21.0000 mg | MEDICATED_PATCH | Freq: Every day | TRANSDERMAL | Status: DC

## 2015-03-16 MED ORDER — IBUPROFEN 200 MG PO TABS: 600.0000 mg | ORAL_TABLET | Freq: Three times a day (TID) | ORAL | Status: DC | PRN

## 2015-03-16 MED ORDER — ALUM & MAG HYDROXIDE-SIMETH 200-200-20 MG/5ML PO SUSP: 30.0000 mL | ORAL | Status: DC | PRN

## 2015-03-16 MED ORDER — LORAZEPAM 0.5 MG PO TABS
2.0000 mg | ORAL_TABLET | Freq: Once | ORAL | Status: AC
  Administered 2015-03-16: 2 mg via ORAL
  Filled 2015-03-16: qty 4

## 2015-03-16 MED ORDER — ACETAMINOPHEN 325 MG PO TABS: 650.0000 mg | ORAL_TABLET | ORAL | Status: DC | PRN

## 2015-03-16 MED ORDER — ONDANSETRON HCL 4 MG PO TABS: 4.0000 mg | ORAL_TABLET | Freq: Three times a day (TID) | ORAL | Status: DC | PRN
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
## 2015-03-16 NOTE — ED Notes (Signed)
Patient request to use the phone. Patient request beverage. Inquired where the doctor was and states he is ready to go.

## 2015-03-16 NOTE — BHH Counselor (Signed)
Completed IVC paperwork and provided to Dr. Hyacinth MeekerMiller who states that he has signed it and given it to the Nurse Secretary to file and send to the magistrate.

## 2015-03-16 NOTE — BH Assessment (Addendum)
Assessment Note  Marc MartinezLandon D Hart is an 24 y.o. male who presents to [facility] with [employer] for the following:  "Pt was seen walking around neighborhood pool. Jumped in clothed, got back out. Repeated same behavior. Pt then went to a daycare where his child is. He does not have custody. Pt seen walking in front of car. Pt at one point he was seen walking barefoot."  Patient alert and refused to answer any questions asked by this Clinical research associatewriter. Patient stated his name and date of birth and stared at the floor while this writer asked the remaining questions. Patient has been diagnosed with Schizoaffective Disorder in the past per the patients chart.  Consulted with [name], NP who recommends that patient be observed overnight and evaluated by psychiatry in the AM. Communicated this disposition with Dr. [name] the ED who agrees with disposition. Dr. [name] confirms that the patient has not been IVC'd at this time.       Patient was previously assessed in the ED on 12/16/2014 reporting:  "Pt was found wandering [place] the [place] [address]. Will not speak to staff/[employer]/or EMS. Pt walking in front of cars. Pt was found with discharge papers from [facility] this morning. D/c with "potential for altered mental status" and "oppositional behavior".  According to EMS patient was brought in after being convinced to come to the emergency room. EMS reports patient was wandering in the middle of the street, walking in front of cars, refusing to answer any questions to police or EMS. According to EMS patient is fully alert, however acting oppositional, would not answer any questions, however agreed to be transported to the emergency room. During interview, patient does not answer any of my questions except for what he was doing earlier, patient states he was "just walking in the road". Patient cannot give me any answer as to why he was walking the road, does not answer as to how or why he was brought here. It is  very difficult to make eye contact with patient, and patient easily distractible.   Clinician talked at length to father. He said that patient has had a history of substance addiction, primarily using ETOH, cocaine, heroin. Patient was diagnosed with adult onset schizophrenia [place] [facility] [place] Patient was released from there in November 2015, the day after Thanksgiving. Patient spent most of his time in the mental health hospital area while serving his 8 month sentence. Patient had been incarcerated for stealing, B&E, heroin possession etc. Patient has a court date in May for B&E.  Father said that patient lived with him from age [age]. At this time patient is not welcome to stay under the roof of father or mother (they are divorced). Patient has stolen from them, from other relatives, friends, etc to the point that he has burned a lot of bridges. He has a 2 yr old son that he cannot care for and who's mother has abandoned. The child is cared for by patient's mother.   Patient has had two suicide attempts in the past. [facility] he tied his bedsheets to his penis & scrotum and started banging his head on the wall. He was put in solitary confinement at that time. He attempted to harm himself again while at [name] also. [facility] in the past also.  Father said that patient [place] [name] [place], [name] and [name].  Patient had made the statement to father in the past that he did not want to "keep living this life." Patient has a hx of picking fights and being beligerant. He once threatened to kill father and police had to take him into custody (this was a couple years ago)."  Axis I: Schizoaffective Disorder Axis II: Deferred Axis III:  Past Medical History  Diagnosis Date  . Schizophrenia   . Substance abuse    Axis IV: problems with access to  health care services and problems with primary support group Axis V: 21-30 behavior considerably influenced by delusions or hallucinations OR serious impairment in judgment, communication OR inability to function in almost all areas  Past Medical History:  Past Medical History  Diagnosis Date  . Schizophrenia   . Substance abuse     History reviewed. No pertinent past surgical history.  Family History: No family history on file.  Social History:  reports that he has been smoking.  He does not have any smokeless tobacco history on file. He reports that he drinks alcohol. He reports that he uses illicit drugs (Marijuana).  Additional Social History:  Alcohol / Drug Use Pain Medications: See MAR Prescriptions: See MAR Over the Counter: See MAR  CIWA: CIWA-Ar BP: 131/77 mmHg Pulse Rate: 99 COWS:    Allergies: No Known Allergies  Home Medications:  (Not in a hospital admission)  OB/GYN Status:  No LMP for male patient.  General Assessment Data [facility] of Assessment: [facility] TTS Assessment: In system Is this a Tele or Face-to-Face Assessment?: Face-to-Face Is this an Initial Assessment or a Re-assessment for this encounter?: Initial Assessment Living Arrangements:  [name]) Can pt return to current living arrangement?:  [name]) Referral Source: Self/Family/Friend     Crisis Care Plan Living Arrangements:  [name]) Name of Psychiatrist: [facility] Name of Therapist: [facility]     Risk to self with the past 6 months Suicidal Ideation:  [name]) Has patient been a risk to self within the past 6 months prior to admission? :  (UTA) Suicidal Intent:  (UTA) Has patient had any suicidal intent within the past 6 months prior to admission? :  (UTA) Is patient at risk for suicide?:  (UTA) Suicidal Plan?:  (UTA) Has patient had any suicidal plan within the past 6 months prior to admission? :  (UTA) Access to Means:  (UTA) What has been your use of drugs/alcohol within the last 12 months?:  UTA Previous Attempts/Gestures:  (UTA) How many times?:  (UTA) Other Self Harm Risks:  (UTA) Triggers for Past Attempts:  (UTA) Intentional Self Injurious Behavior:  (UTA) Family Suicide History:  (UTA) Recent stressful life event(s):  (UTA) Persecutory voices/beliefs?:  [name]) Depression:  (UTA) Depression Symptoms:  (UTA) Substance abuse history and/or treatment for substance abuse?:  (UTA) Suicide prevention information given to non-admitted patients:  (UTA)  Risk to Others within the past 6 months Homicidal Ideation:  (UTA) Does patient have any lifetime risk of violence toward others beyond the six months prior to admission? :  (UTA) Thoughts of Harm to Others:  (UTA) Current Homicidal Intent:  (UTA) Current Homicidal Plan:  (UTA) Access to Homicidal Means:  (UTA) Identified Victim: UTA History of harm to others?:  (UTA) Assessment of Violence:  (UTA) Violent Behavior Description: UTA Does patient have access to weapons?:  (UTA) Criminal Charges Pending?:  (UTA) Does patient have a court date:  (UTA) Is patient on probation?: Unknown (UTA)  Psychosis Hallucinations:  (UTA) Delusions:  (UTA)  Mental Status  Report Appearance/Hygiene: Unable to Assess Eye Contact: Unable to Assess Motor Activity: Unable to assess Speech: Unable to assess Level of Consciousness: Unable to assess Mood: Empty Affect: Constricted, Flat Anxiety Level:  (UTA) Thought Processes: Unable to Assess (UTA) Judgement: Unable to Assess (UTA) Orientation: Unable to assess (UTA) Obsessive Compulsive Thoughts/Behaviors: Unable to Assess (UTA)  Cognitive Functioning Concentration: Unable to Assess (UTA) Memory:  (UTA) IQ:  (UTA) Insight: Unable to Assess (UTA) Impulse Control: Unable to Assess (UTA) Appetite:  (UTA) Sleep: Unable to Assess (UTA) Total Hours of Sleep:  (UTA) Vegetative Symptoms: Unable to Assess (UTA)     Prior Inpatient Therapy Prior Inpatient Therapy: Yes Prior Therapy  Dates: 12/2014 Prior Therapy Facilty/Provider([facility]): [facility]  Prior Outpatient Therapy Prior Outpatient Therapy:  (UTA) Prior Therapy Dates: UTA Prior Therapy Facilty/Provider(s): UTA Reason for Treatment: UTA Does patient have an ACCT team?:  (UTA) Does patient have Intensive In-House Services?  : Unknown Does patient have [facility] services? : Unknown Does patient have P4[facility] services?: Unknown          Abuse/Neglect Assessment (Assessment to be complete while patient is alone) Physical Abuse:  [name]) Verbal Abuse:  [name]) Sexual Abuse:  [name]) Exploitation of patient/patient's resources:  [name]) Self-Neglect:  [name])     Advance Directives (For Healthcare) Does patient have an advance directive?: No Would patient like information on creating an advanced directive?: No - patient declined information    Additional Information 1:1 In Past 12 Months?:  (UTA) CIRT Risk:  (UTA) Elopement Risk:  (UTA) Does patient have medical clearance?: Yes     Disposition:  Disposition Initial Assessment Completed for this Encounter: Yes Disposition of Patient: Other dispositions  On Site Evaluation by:   Reviewed with Physician:    Marjoria Mancillas 03/16/2015 3:32 PM

## 2015-03-16 NOTE — ED Notes (Signed)
Patient appears flat. Pacing unit with fists closed. Other patients report he has entered their room.   Q15 safety checks continue.

## 2015-03-16 NOTE — ED Notes (Signed)
Pt was seen walking around neighborhood pool.  Jumped in clothed, got back out.  Repeated same behavior.  Pt then went to a daycare where his child is.  He does not have custody.  Pt seen walking in front of car.  Pt at one point he was seen walking barefoot.  Brought in by [employer].  Pt does not respond and answer questions.  Pt is alert and ambulatory.  Flat affect.

## 2015-03-16 NOTE — ED Provider Notes (Signed)
CSN: [id number]     Arrival date & time 03/16/15  1238 History   First MD Initiated Contact with Patient 03/16/15 1310     Chief Complaint  Patient presents with  . Abnormal Behavior      (Consider location/radiation/quality/duration/timing/severity/associated sxs/prior Treatment) HPI Comments: Patient being brought in by police for abnormal behavior. Mom states that he's coming off of meth for the last 3 days and police say he was talking gibberish when in their custody. He apparently was seen walking around the neighborhood pool and then jumped in completely closed.back out and repeated the same behavior. He then went to daycare where his child is he does not have custody. He was walking barefoot and then attempting to walk in front of cars. Patient will not reiterate any of this except that he said the police picked him up because he was walking in front of a car. When asked if he was suicidal he did not answer except ask the interviewer if I was suicidal. Patient denies taking any prescription medication. No further history obtained because patient will not answer questions.  The history is provided by the patient. The history is limited by the condition of the patient.    Past Medical History  Diagnosis Date  . Schizophrenia   . Substance abuse    History reviewed. No pertinent past surgical history. No family history on file. History  Substance Use Topics  . Smoking status: Current Every Day Smoker  . Smokeless tobacco: Not on file  . Alcohol Use: Yes    Review of Systems  Unable to perform ROS     Allergies  Review of patient's allergies indicates no known allergies.  Home Medications   Prior to Admission medications   Not on File   BP 131/77 mmHg  Pulse 99  Temp(Src) 98.1 F (36.7 C) (Oral)  Resp 12  Ht  (1.803 m)  SpO2 97% Physical Exam  Constitutional: He is oriented to person, place, and time. He appears well-developed and well-nourished. No  distress.  HENT:  Head: Normocephalic and atraumatic.  Mouth/Throat: Oropharynx is clear and moist.  Eyes: Conjunctivae and EOM are normal. Pupils are equal, round, and reactive to light.  Neck: Normal range of motion. Neck supple.  Cardiovascular: Normal rate, regular rhythm and intact distal pulses.   No murmur heard. Pulmonary/Chest: Effort normal and breath sounds normal. No respiratory distress. He has no wheezes. He has no rales.  Abdominal: Soft. He exhibits no distension. There is no tenderness. There is no rebound and no guarding.  Musculoskeletal: Normal range of motion. He exhibits no edema or tenderness.  Neurological: He is alert and oriented to person, place, and time.  Skin: Skin is warm and dry. No rash noted. No erythema.  Psychiatric: His affect is blunt. He is noncommunicative.  Narrative patient is hallucinating or homicidal or suicidal because he would not answer any questions  Nursing note and vitals reviewed.   ED Course  Procedures (including critical care time) Labs Review Labs Reviewed  COMPREHENSIVE METABOLIC PANEL - Abnormal; Notable for the following:    Glucose, Bld 101 (*)    Albumin 5.1 (*)    AST 68 (*)    Total Bilirubin 2.4 (*)    All other components within normal limits  ETHANOL - Abnormal; Notable for the following:    Alcohol, Ethyl (B) 14 (*)    All other components within normal limits  ACETAMINOPHEN LEVEL - Abnormal; Notable for the following:  Acetaminophen (Tylenol), Serum <10 (*)    All other components within normal limits  SALICYLATE LEVEL  CBC  URINE RAPID DRUG SCREEN, HOSP PERFORMED    Imaging Review No results found.   EKG Interpretation None      MDM   Final diagnoses:  None   patient presents by [employer] but well really answer any questions for me. Apparently patient's mom states that he's been coming off meth for the last 3 days and was not acting himself. Police reported that he was talking gibberish and their  custody however he will not answer any questions for me. The only thing he tells me is that the police picked him up because he was walking into the street.  Here patient will not answer any questions. He looks blankly at the floor when I ask if he is suicidal he asked me if I am suicidal. Labs show no acute findings. UDS is still pending. Will have TTS evaluate  Gwyneth SproutWhitney Arianis Bowditch, MD 03/16/15 [phone]

## 2015-03-16 NOTE — ED Notes (Signed)
Pt able to answer simple questions with yes/no or nods head.  Pt staring blankly at floor.  Per [employer], pt's mom told them pt was coming off meth x3 days.  [employer] reports pt "was talking gibberish" when in their custody.  Pt denies SI/HI, denies drugs/ETOH.  NAD.

## 2015-03-16 NOTE — ED Notes (Signed)
Patient quiet and guarded.  Food/fluids offered.

## 2015-03-16 NOTE — ED Notes (Signed)
Urinated on BR floor and was attempting to deficate on floor.

## 2015-03-16 NOTE — ED Notes (Signed)
Patient is pacing with flat affect.  Non-verbal.  Agreed to ativan  po.

## 2015-03-16 NOTE — ED Notes (Signed)
Per Candise BowensJen, Consulting civil engineercharge RN.  Move pt to SAPPU 42.

## 2015-03-16 NOTE — ED Notes (Signed)
Mother - Hazel SamsDawn Howell - ph: [phone]

## 2015-03-16 NOTE — BH Assessment (Addendum)
Consulted with Tobin ChadJosephne Onuoha, NP who recommends patient be observed overnight for evaluation by psychiatry in AM. Contacted Dr. [name] to inform of disposition and she agrees.  Dr. [name] states that the patient is not currently under IVC and she is not sure if patient meets criteria due to him refusing to speak with anyone.

## 2015-03-17 DIAGNOSIS — F259 Schizoaffective disorder, unspecified: Secondary | ICD-10-CM

## 2015-03-17 MED ORDER — ZIPRASIDONE MESYLATE 20 MG IM SOLR
20.0000 mg | Freq: Once | INTRAMUSCULAR | Status: AC
  Administered 2015-03-17: 20 mg via INTRAMUSCULAR

## 2015-03-17 MED ORDER — DIPHENHYDRAMINE HCL 50 MG/ML IJ SOLN
INTRAMUSCULAR | Status: AC
  Filled 2015-03-17: qty 1

## 2015-03-17 MED ORDER — LORAZEPAM 2 MG/ML IJ SOLN
INTRAMUSCULAR | Status: AC
  Filled 2015-03-17: qty 1

## 2015-03-17 MED ORDER — DIPHENHYDRAMINE HCL 50 MG/ML IJ SOLN
50.0000 mg | Freq: Once | INTRAMUSCULAR | Status: AC
  Administered 2015-03-17: 50 mg via INTRAMUSCULAR

## 2015-03-17 MED ORDER — ZIPRASIDONE MESYLATE 20 MG IM SOLR
10.0000 mg | Freq: Once | INTRAMUSCULAR | Status: AC
  Administered 2015-03-17: 10 mg via INTRAMUSCULAR
  Filled 2015-03-17: qty 20

## 2015-03-17 MED ORDER — STERILE WATER FOR INJECTION IJ SOLN
INTRAMUSCULAR | Status: AC
  Filled 2015-03-17: qty 10

## 2015-03-17 MED ORDER — LORAZEPAM 2 MG/ML IJ SOLN
1.0000 mg | Freq: Once | INTRAMUSCULAR | Status: AC
  Administered 2015-03-17: 1 mg via INTRAMUSCULAR

## 2015-03-17 MED ORDER — ZIPRASIDONE MESYLATE 20 MG IM SOLR
INTRAMUSCULAR | Status: AC
  Filled 2015-03-17: qty 20

## 2015-03-17 MED ORDER — DIPHENHYDRAMINE HCL 50 MG/ML IJ SOLN
25.0000 mg | Freq: Once | INTRAMUSCULAR | Status: AC
  Administered 2015-03-17: 25 mg via INTRAMUSCULAR
  Filled 2015-03-17: qty 1

## 2015-03-17 MED ORDER — OLANZAPINE 5 MG PO TBDP
5.0000 mg | ORAL_TABLET | Freq: Once | ORAL | Status: AC
  Administered 2015-03-17: 5 mg via ORAL
  Filled 2015-03-17: qty 1

## 2015-03-17 MED ORDER — HYDROXYZINE HCL 25 MG PO TABS
50.0000 mg | ORAL_TABLET | Freq: Once | ORAL | Status: AC
  Administered 2015-03-17: 50 mg via ORAL
  Filled 2015-03-17: qty 2

## 2015-03-17 NOTE — ED Notes (Signed)
Patient slept most of the day.  When he woke up he requested to see the doctor.  The doctor had left, but the NP went in and spoke with him.  He was polite and cooperative during the conversation.  He denied any thoughts of harm to self or others.  When confronted about his behavior this morning he stated he was angry this morning and blamed his behavior on benzos.  He sates that he has a history of benzos making him angry.  Later he was pacing in the hallway and asked for Ativan.  I reminded him of what he had told us earlier and he said he thought he would be fine since he had taken some yesterday.  Spoke with Julieanne CottonJosephine, NP and she ordered hydroxyzine 50 mg and olanzapine 5 mg.  Patient took those and was told that he was [facility].  He was cooperative and receptive to the information.

## 2015-03-17 NOTE — Progress Notes (Signed)
Per connie [facility].   Olga CoasterKristen Evans Levee, LCSW  Clinical Social Work  [facility] [phone]
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
## 2015-03-17 NOTE — Progress Notes (Signed)
[facility] by Lorine Bearsonnie Mullins. Pt to be transported under IVC to [facility]. Rn to call report to [phone]  Olga CoasterKristen Brice Kossman, LCSW  Clinical Social Work  [facility] [phone]
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
## 2015-03-17 NOTE — ED Notes (Signed)
Restraints were removed, patient was monitored one to one for 30 minutes post release.  VS were obtained after one hour.  Patient is currently resting quietly with even and unlabored respirations.
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
## 2015-03-17 NOTE — Progress Notes (Addendum)
[facility]. Completed phone demographics with Robinette. [facility] authorization# [id number]  Olga CoasterKristen Kathaleen Dudziak, LCSW  Clinical Social Work  [facility] [phone]
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
## 2015-03-17 NOTE — ED Notes (Signed)
[facility]  [facility]  All belongings given to the transport team.

## 2015-05-19 ENCOUNTER — Encounter (HOSPITAL_COMMUNITY): Payer: Self-pay | Admitting: Emergency Medicine

## 2015-05-19 ENCOUNTER — Emergency Department (HOSPITAL_COMMUNITY)
Admission: EM | Admit: 2015-05-19 | Discharge: 2015-05-20 | Disposition: A | Discharge status: Home / Self Care | Payer: Self-pay | Attending: Emergency Medicine | Admitting: Emergency Medicine

## 2015-05-19 DIAGNOSIS — Z8659 Personal history of other mental and behavioral disorders: Secondary | ICD-10-CM | POA: Insufficient documentation

## 2015-05-19 DIAGNOSIS — M79673 Pain in unspecified foot: Secondary | ICD-10-CM

## 2015-05-19 DIAGNOSIS — Z720 Tobacco use: Secondary | ICD-10-CM | POA: Insufficient documentation

## 2015-05-19 DIAGNOSIS — M79672 Pain in left foot: Secondary | ICD-10-CM | POA: Insufficient documentation

## 2015-05-19 DIAGNOSIS — M79671 Pain in right foot: Secondary | ICD-10-CM | POA: Insufficient documentation

## 2015-05-19 MED ORDER — IBUPROFEN 400 MG PO TABS
800.0000 mg | ORAL_TABLET | Freq: Once | ORAL | Status: AC
  Administered 2015-05-20: 800 mg via ORAL
  Filled 2015-05-19: qty 2

## 2015-05-19 NOTE — ED Notes (Signed)
Pt. reports chronic feet pain with swelling worse this week , denies injury /ambulatory .

## 2015-05-19 NOTE — ED Provider Notes (Signed)
CSN: [id number]     Arrival date & time 05/19/15  2322 History  This chart was scribed for Charlestine Night, PA-C, working with Loren Racer, MD by Elon Spanner, ED Scribe. This patient was seen in room TR09C/TR09C and the patient's care was started at 11:40 PM.   Chief Complaint  Patient presents with  . Foot Pain   The history is provided by the patient. No language interpreter was used.   HPI Comments: Marc Hart is a 24 y.o. male who presents to the Emergency Department complaining of constant, improving, mild bilateral foot pain onset yesterday after walking in the woods barefooted.  Patient denies current alcohol or drug use.     Past Medical History  Diagnosis Date  . Schizophrenia   . Substance abuse    History reviewed. No pertinent past surgical history. No family history on file. Social History  Substance Use Topics  . Smoking status: Current Every Day Smoker  . Smokeless tobacco: None  . Alcohol Use: Yes    Review of Systems A complete 10 system review of systems was obtained and all systems are negative except as noted in the HPI and PMH.   Allergies  Review of patient's allergies indicates no known allergies.  Home Medications   Prior to Admission medications   Not on File   BP 131/72 mmHg  Pulse 88  Temp(Src) 98 F (36.7 C) (Oral)  Resp 16  SpO2 97% Physical Exam  Constitutional: He is oriented to person, place, and time. He appears well-developed and well-nourished. No distress.  HENT:  Head: Normocephalic and atraumatic.  Pulmonary/Chest: Effort normal.  Musculoskeletal: Normal range of motion.       Feet:  Neurological: He is alert and oriented to person, place, and time.  Skin: Skin is warm and dry.  Psychiatric: He has a normal mood and affect. His behavior is normal.  Nursing note and vitals reviewed.   ED Course  Procedures (including critical care time)  DIAGNOSTIC STUDIES: Oxygen Saturation is 97% on RA, normal by my  interpretation.    COORDINATION OF CARE:  11:50 PM Discussed treatment plan with patient at bedside.  Patient acknowledges and agrees with plan.    I have personally reviewed and evaluated these images and lab results as part of my medical decision-making.     Charlestine Night, PA-C 05/20/15 0009  Loren Racer, MD 05/20/15 [phone]

## 2015-05-20 NOTE — ED Notes (Signed)
Pt stable, ambulatory, states understanding of discharge iinstructions 

## 2015-05-20 NOTE — Discharge Instructions (Signed)
Return here as needed. °

## 2015-05-23 ENCOUNTER — Emergency Department (HOSPITAL_COMMUNITY)
Admission: EM | Admit: 2015-05-23 | Discharge: 2015-05-23 | Disposition: A | Discharge status: Home / Self Care | Payer: Self-pay | Attending: Emergency Medicine | Admitting: Emergency Medicine

## 2015-05-23 ENCOUNTER — Encounter (HOSPITAL_COMMUNITY): Payer: Self-pay

## 2015-05-23 DIAGNOSIS — G8929 Other chronic pain: Secondary | ICD-10-CM | POA: Insufficient documentation

## 2015-05-23 DIAGNOSIS — Z720 Tobacco use: Secondary | ICD-10-CM | POA: Insufficient documentation

## 2015-05-23 DIAGNOSIS — Z8659 Personal history of other mental and behavioral disorders: Secondary | ICD-10-CM | POA: Insufficient documentation

## 2015-05-23 NOTE — ED Notes (Addendum)
Discharge paperwork reviewed with pt. Pt did not ask any questions and refused to  answer questions. Also refused E-signature for discharge. Pt walked out and left paperwork.

## 2015-05-23 NOTE — ED Provider Notes (Signed)
CSN: [id number]     Arrival date & time 05/23/15  4098 History   First MD Initiated Contact with Patient 05/23/15 1413     Chief Complaint  Patient presents with  . Foot Pain  . Generalized Body Aches    Patient is a vague historian (Consider location/radiation/quality/duration/timing/severity/associated sxs/prior Treatment) HPI Patient complains of pain "all over my body" for the past several months, unchanged. No treatment prior to coming here. Nothing makes symptoms better or worse. No other associated symptoms. No fever. No nausea vomiting. He denies wanting to harm himself or others. Presently hungry  Past Medical History  Diagnosis Date  . Schizophrenia   . Substance abuse    History reviewed. No pertinent past surgical history. History reviewed. No pertinent family history. Social History  Substance Use Topics  . Smoking status: Current Every Day Smoker  . Smokeless tobacco: None  . Alcohol Use: Yes   homeless   admits to alcohol last time treated a's ago. Denies illicit drug use.  Review of Systems  Constitutional: Negative.   HENT: Negative.   Respiratory: Negative.   Cardiovascular: Negative.   Gastrointestinal: Negative.   Musculoskeletal: Positive for myalgias.  Skin: Negative.   Neurological: Negative.   Psychiatric/Behavioral: Negative.   All other systems reviewed and are negative.     Allergies  Review of patient's allergies indicates no known allergies.  Home Medications   Prior to Admission medications   Not on File   BP 133/73 mmHg  Pulse 57  Temp(Src) 97.9 F (36.6 C) (Oral)  Resp 16  SpO2 100% Physical Exam  Constitutional: He is oriented to person, place, and time. No distress.  Unkempt  HENT:  Head: Normocephalic and atraumatic.  Eyes: Conjunctivae are normal. Pupils are equal, round, and reactive to light.  Neck: Neck supple. No tracheal deviation present. No thyromegaly present.  Cardiovascular: Normal rate and regular rhythm.    No murmur heard. Pulmonary/Chest: Effort normal and breath sounds normal.  Abdominal: Soft. Bowel sounds are normal. He exhibits no distension. There is no tenderness.  Musculoskeletal: Normal range of motion. He exhibits no edema or tenderness.  All 4 extremities without redness swelling or tenderness neurovascular intact  Neurological: He is alert and oriented to person, place, and time. Coordination normal.  Gait normal  Skin: Skin is warm and dry. No rash noted.  Psychiatric: He has a normal mood and affect.  Nursing note and vitals reviewed.   ED Course  Procedures (including critical care time) Labs Review Labs Reviewed - No data to display  Imaging Review No results found. I have personally reviewed and evaluated these images and lab results as part of my medical decision-making.   EKG Interpretation None      patient given a meal while here which he ate in its entirety MDM   no further diagnostic workup is presently patient is referred to resource guide and  [place] [facility] to get a primary care physician  Final diagnoses:  None    diagnosis chronic pain      Doug Sou, MD 05/23/15 1436
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
## 2015-05-23 NOTE — Discharge Instructions (Signed)
Chronic Pain Contact the [facility] or any of the numbers on the resource guide to get a primary care physician Chronic pain can be defined as pain that is off and on and lasts for 3-6 months or longer. Many things cause chronic pain, which can make it difficult to make a diagnosis. There are many treatment options available for chronic pain. However, finding a treatment that works well for you may require trying various approaches until the right one is found. Many people benefit from a combination of two or more types of treatment to control their pain. SYMPTOMS  Chronic pain can occur anywhere in the body and can range from mild to very severe. Some types of chronic pain include:  Headache.  Low back pain.  Cancer pain.  Arthritis pain.  Neurogenic pain. This is pain resulting from damage to nerves. People with chronic pain may also have other symptoms such as:  Depression.  Emergency Department Resource Guide 1) Find a Doctor and Pay Out of Pocket Although you won't have to find out who is covered by your insurance plan, it is a good idea to ask around and get recommendations. You will then need to call the office and see if the doctor you have chosen will accept you as a new patient and what types of options they offer for patients who are self-pay. Some doctors offer discounts or will set up payment plans for their patients who do not have insurance, but you will need to ask so you aren't surprised when you get to your appointment.  2) Contact Your Local Health Department Not all health departments have doctors that can see patients for sick visits, but many do, so it is worth a call to see if yours does. If you don't know where your local health department is, you can check in your phone book. The CDC also has a tool to help you locate your state's health department, and many state websites also have listings of all of their local health  departments.  3) Find a Walk-in Clinic If your illness is not likely to be very severe or complicated, you may want to try a walk in clinic. These are popping up all over the country in pharmacies, drugstores, and shopping centers. They're usually staffed by nurse practitioners or physician assistants that have been trained to treat common illnesses and complaints. They're usually fairly quick and inexpensive. However, if you have serious medical issues or chronic medical problems, these are probably not your best option.  No Primary Care Doctor: - Call Health Connect at  [phone] - they can help you locate a primary care doctor that  accepts your insurance, provides certain services, etc. - Physician Referral Service- [phone]  Chronic Pain Problems: Organization         Address  Phone   Notes  [facility]  [phone] Patients need to be referred by their primary care doctor.   Medication Assistance: Organization         Address  Phone   Notes  [facility] [address] [place] [place], [place] [address] [phone] --Must be a resident of [facility] -- Must have NO insurance coverage whatsoever (no Medicaid/ Medicare, etc.) -- The pt. MUST have a primary care doctor that directs their care regularly and follows them in the community   [facility]  [phone]   [address]  (405)[phone]  Agencies that provide inexpensive medical care: °Organization         Address  Phone   Notes  °[facility]  (336) 832-8035   °[facility]    (336) 832-7272   °[facility] [address] °[place], [place] [address] (336) 832-4777   °[facility] [address], °[place] (336) 271-4999   °[facility]    (336) 373-0678   °[facility]    (336) 272-1050   °[facility] ° [address], [place] Phone:  (336)  832-4444, Fax:  (336) 832-4440 Hours of Operation:  9 am - 6 pm, M-F.  Also accepts Medicaid/Medicare and self-pay.  °[facility] ° [address], [place] Phone: (336) [phone], Fax: (336) [phone]. Hours of Operation:  8:30 am - 5:30 pm, M-F.  Also accepts Medicaid and self-pay.  °[facility] [place] [address], [place] Phone: (336) 878-6027   °[facility] [address], [place], [place] (336)723-1848, Ext. 123 Mondays & Thursdays: 7-9 AM.  First 15 patients are seen on a first come, first serve basis. °  ° °Medicaid-accepting [place] Providers: ° °Organization         Address  Phone   Notes  °[facility] [address], [place] (336) 641-2100 Also accepts self-pay patients.  °[facility] [address], [place] ° (336) 856-9996   °[facility] [address], [place] (336) 288-8857   °[facility] [address] [place] [address], [place] (336) 299-7000   °Veita Bland [address], [place]  ° (336) 373-1557 Only accepts [place] Access Medicaid patients after they have their name applied to their card.  ° °Self-Pay (no insurance) in [place]: ° °Organization         Address  Phone   Notes  °Sickle Cell Patients, [facility] [address], [place] (336) 832-1970   °[facility] [address], [place] (336) 832-4400   °[facility] ° [address], [place] (336) 992-4800   °[facility]/Dr. Osei-Bonsu ° [address] [place] [address], [place] or [address], [place] (336) [phone] Phone number for both [place] and [place] locations is the same.  °[facility] [address], [place] (336) 299-0000   °[facility] [address] [place] [address], [place] or [address] (336)  852-7530 °(336) 878-2260   °[facility] [address], [place] (336) 350-1642, phone; (336) 294-5005, fax Sees patients 1st and 3rd Saturday of every month.  Must not qualify for public or private insurance (i.e. Medicaid, Medicare, [place] Health Choice, Veterans' Benefits) • Household income should be no more than 200% of the poverty level •The clinic cannot treat you if you are pregnant or think you are pregnant • Sexually transmitted diseases are not treated at the clinic.  ° ° °Dental Care: °Organization         Address  Phone  Notes  °[place] [facility] [address], [place] (336) 641-6152 Accepts children up to age 21 who are enrolled in Medicaid or [place] Health Choice; pregnant women with a Medicaid card; and children who have applied for Medicaid or   [place] Health Choice, but were declined, whose parents can pay a reduced fee at time of service.  °[place] [facility] [place]  [address], [place] (336) 641-7733 Accepts children up to age 21 who are enrolled in Medicaid or [place] Health Choice; pregnant women with a Medicaid card; and children who have applied for Medicaid or [place] Health Choice, but were declined, whose parents can pay a reduced fee at time of service.  °[facility] ° [address], [place] (336) 641-4533 Patients are seen by appointment only. Walk-ins are not accepted. [facility] will see patients 18 years of age and older. °Monday - Tuesday (8am-5pm) °Most Wednesdays (8:30-5pm) °$30 per visit, cash only  °[facility] ° [address], [place] (336) 641-4533 Patients are seen by appointment only. Walk-ins are not accepted. [facility] will see patients 18 years of age and older. °One Wednesday Evening (Monthly: Volunteer Based).  $30 per visit, cash only  °[facility]  (919) 537-3737 for adults;  Children under age 4, call Graduate Pediatric Dentistry at ([phone]) [phone]. Children aged 4-14, please call (919) [phone] to request a pediatric application. ° Dental services are provided in all areas of dental care including fillings, crowns and bridges, complete and partial dentures, implants, gum treatment, root canals, and extractions. Preventive care is also provided. Treatment is provided to both adults and children. °Patients are selected via a lottery and there is often a waiting list. °  °[facility] [address], °[place] ° (336) 763-8833 www.drcivils.com °  °[facility] [address], [place], [place] (336)723-1848, Ext. 123 Second and Fourth Thursday of each month, opens at 6:30 AM; Clinic ends at 9 AM.  Patients are seen on a first-come first-served basis, and a limited number are seen during each clinic.  ° °[facility] ° [address], [place], [place] (336) 723-7904   Eligibility Requirements °You must have lived in [place], [place], or [place] counties for at least the last three months. °  You cannot be eligible for state or federal sponsored healthcare insurance, including Veterans Administration, Medicaid, or Medicare. °  You generally cannot be eligible for healthcare insurance through your employer.  °  How to apply: °Eligibility screenings are held every Tuesday and Wednesday afternoon from 1:00 pm until 4:00 pm. You do not need an appointment for the interview!  °[facility] [address], [place], [place] 336-631-2330   °[facility]  336-342-8273   °[facility]  336-703-3100   °[facility]  336-570-6415   ° °Behavioral Health Resources in the Community: °Intensive Outpatient Programs °Organization         Address  Phone  Notes  °[facility] [address], [place], [place] 336-878-6098   °[facility] [address], [place], [place] 336-832-9800   °[facility] [address], [place], [place] ° 336-882-2125   °[place] [address],  °[place], [place] 1-800-853-5163 or 336-641-4981   °Substance Abuse Resources °Organization         Address  Phone  Notes  °[facility]  336-882-2125   °[facility]  336-784-9470   °[facility]  336-285-9073   °[facility]  336-845-3988   °Residential & Outpatient Substance Abuse Program  1-800-659-3381   °  Psychological Services Organization         Address  Phone  Notes  [facility]  (684) 560-4655   [facility]  612-039-7052   [facility] 229-493-5372 [address], [place] 5150211263 or 260-371-1177    Mobile Crisis Teams Organization         Address  Phone  Notes  [facility], Mobile Crisis Care Unit  269-091-8494   [facility]  [address] [place], [place] 564-332-9518   Doristine Locks [address] [place] 841-660-6301    Self-Help/Support Groups Organization         Address  Phone             Notes  [facility] - variety of support groups  336- I7437963 Call for more information  Narcotics Anonymous (NA), [facility] [address], [employer] [place]  2 meetings at this location   Statistician         Address  Phone  Notes  [facility] [address],    [place]  6-010-932-3557   [facility]  [address] [place] [address], [place], [place] 427-062-3762   [facility] [address] [place], [place] [phone] Admissions: 8am-3pm M-F  [facility] [address],    [place], [place] 737-106-2694   [facility] [address] [place], [place], [place] [phone]   [facility] [address]  [place], [place] [phone]   [facility] - Intensive  Outpatient [address], [place], [place] 716-967-8938   [facility] [address] [name]  [place], [place] 1-017-510-2585 or 267-616-6595   [facility] [address], [place], [place] 614-431-5400 Accepts Medicaid  [facility] [address]  [place] 8-676-195-0932 Substance Abuse/Addiction Treatment   [facility] Organization         Address  Phone  Notes  [facility]  [phone]   Angie Fava, PhD [address] [place], [place]   719-771-8404 or 204-406-1495   [facility]   [address] [place], [place] [phone]   [facility] [address], [place], [place] 213-071-6000 Insurance/Medicaid/sponsorship through [facility] [address]                                    [place], [place] 703-569-8306 Therapy/tele-psych/case  [facility] [address], [place] [phone]    Dr. Lolly Mustache  [phone]   [facility]  [employer] [facility] [facility] Dept. 1) [address], [place] 2) [address], [place] 3)  [address], [place] [phone]  647-523-2397   [facility] Child Abuse Hotline 970-624-9184 or 256-318-6118 (After Hours)       Anger.  Insomnia.  Anxiety. DIAGNOSIS  Your health care provider will help diagnose your condition over time. In many cases, the initial focus will be on excluding possible conditions that could be causing the pain. Depending on your symptoms, your health care provider may order tests to diagnose your condition. Some of these tests may include:   Blood tests.   CT scan.  MRI.   X-rays.   Ultrasounds.   Nerve conduction studies.  You may need to see a specialist.  TREATMENT  Finding treatment that works well may take time. You may be referred to a pain specialist.  He or she may prescribe medicine or therapies, such as:   Mindful meditation or yoga.  Shots (injections) of numbing or pain-relieving medicines into the spine or area of pain.  Local electrical stimulation.  Acupuncture.   Massage therapy.   Aroma, color, light, or sound therapy.   Biofeedback.   Working with a physical therapist to keep from getting stiff.   Regular, gentle exercise.   Cognitive or behavioral therapy.   Group support.  Sometimes, surgery may be recommended.  HOME CARE INSTRUCTIONS   Take all medicines as directed by your health care provider.   Lessen stress in your life by relaxing and doing things such as listening to calming music.   Exercise or be active as directed by your health care provider.   Eat a healthy diet and include things such as vegetables, fruits, fish, and lean meats in your diet.   Keep all follow-up appointments with your health care provider.   Attend a support group with others suffering from chronic pain. SEEK MEDICAL CARE IF:   Your pain gets worse.   You develop a new pain that was not there before.   You cannot tolerate medicines given to you by your health care provider.   You have new symptoms since your last visit with your health care provider.  SEEK IMMEDIATE MEDICAL CARE IF:   You feel weak.   You have decreased sensation or numbness.   You lose control of bowel or bladder function.   Your pain suddenly gets much worse.   You develop shaking.  You develop chills.  You develop confusion.  You develop chest pain.  You develop shortness of breath.  MAKE SURE YOU:  Understand these instructions.  Will watch your condition.  Will get help right away if you are not doing well or get worse. Document Released: 05/12/2002 Document Revised: 04/22/2013 Document Reviewed: 02/13/2013 [facility] Patient Information 2015 [place], [place]. This information is not intended to replace advice  given to you by your health care provider. Make sure you discuss any questions you have with your health care provider.

## 2015-05-23 NOTE — ED Notes (Signed)
Pt refused discharge vitals. Pt states "there's just something about them I don't like."

## 2015-05-23 NOTE — ED Notes (Signed)
Pt states his pain is all over his body and is "indescribable". Pt has flat affect and will not answer questions at times. Pt denies SI, states "why would I want to hurt myself?".

## 2015-07-16 ENCOUNTER — Emergency Department (HOSPITAL_COMMUNITY): Payer: Self-pay

## 2015-07-16 ENCOUNTER — Emergency Department (HOSPITAL_COMMUNITY)
Admission: EM | Admit: 2015-07-16 | Discharge: 2015-07-16 | Disposition: A | Discharge status: Home / Self Care | Payer: Self-pay | Attending: Emergency Medicine | Admitting: Emergency Medicine

## 2015-07-16 ENCOUNTER — Encounter (HOSPITAL_COMMUNITY): Payer: Self-pay | Admitting: *Deleted

## 2015-07-16 DIAGNOSIS — R079 Chest pain, unspecified: Secondary | ICD-10-CM | POA: Insufficient documentation

## 2015-07-16 DIAGNOSIS — G479 Sleep disorder, unspecified: Secondary | ICD-10-CM | POA: Insufficient documentation

## 2015-07-16 DIAGNOSIS — Z720 Tobacco use: Secondary | ICD-10-CM | POA: Insufficient documentation

## 2015-07-16 DIAGNOSIS — Z8659 Personal history of other mental and behavioral disorders: Secondary | ICD-10-CM | POA: Insufficient documentation

## 2015-07-16 DIAGNOSIS — Z590 Homelessness: Secondary | ICD-10-CM | POA: Insufficient documentation

## 2015-07-16 MED ORDER — IBUPROFEN 400 MG PO TABS
600.0000 mg | ORAL_TABLET | Freq: Once | ORAL | Status: AC
  Administered 2015-07-16: 600 mg via ORAL
  Filled 2015-07-16 (×2): qty 1

## 2015-07-16 MED ORDER — GI COCKTAIL ~~LOC~~
30.0000 mL | Freq: Once | ORAL | Status: AC
  Administered 2015-07-16: 30 mL via ORAL
  Filled 2015-07-16: qty 30
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
## 2015-07-16 NOTE — ED Notes (Signed)
Pt. Left with all belongings and refused wheelchair. Discharge instructions were reviewed and all questions were answered.  

## 2015-07-16 NOTE — ED Notes (Signed)
The pt is c/o anterior chest pain for one hour  No other symptoms
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
## 2015-09-01 ENCOUNTER — Encounter (HOSPITAL_COMMUNITY): Payer: Self-pay | Admitting: Emergency Medicine

## 2015-09-01 ENCOUNTER — Emergency Department (HOSPITAL_COMMUNITY)
Admission: EM | Admit: 2015-09-01 | Discharge: 2015-09-01 | Disposition: A | Discharge status: Home / Self Care | Payer: Self-pay | Attending: Emergency Medicine | Admitting: Emergency Medicine

## 2015-09-01 DIAGNOSIS — Y9289 Other specified places as the place of occurrence of the external cause: Secondary | ICD-10-CM | POA: Insufficient documentation

## 2015-09-01 DIAGNOSIS — S6991XA Unspecified injury of right wrist, hand and finger(s), initial encounter: Secondary | ICD-10-CM | POA: Insufficient documentation

## 2015-09-01 DIAGNOSIS — R51 Headache: Secondary | ICD-10-CM

## 2015-09-01 DIAGNOSIS — F17200 Nicotine dependence, unspecified, uncomplicated: Secondary | ICD-10-CM | POA: Insufficient documentation

## 2015-09-01 DIAGNOSIS — S0990XA Unspecified injury of head, initial encounter: Secondary | ICD-10-CM | POA: Insufficient documentation

## 2015-09-01 DIAGNOSIS — R519 Headache, unspecified: Secondary | ICD-10-CM

## 2015-09-01 DIAGNOSIS — Y9389 Activity, other specified: Secondary | ICD-10-CM | POA: Insufficient documentation

## 2015-09-01 DIAGNOSIS — M79641 Pain in right hand: Secondary | ICD-10-CM

## 2015-09-01 DIAGNOSIS — Z8659 Personal history of other mental and behavioral disorders: Secondary | ICD-10-CM | POA: Insufficient documentation

## 2015-09-01 DIAGNOSIS — Y998 Other external cause status: Secondary | ICD-10-CM | POA: Insufficient documentation

## 2015-09-01 MED ORDER — IBUPROFEN 800 MG PO TABS
800.0000 mg | ORAL_TABLET | Freq: Once | ORAL | Status: DC
  Filled 2015-09-01: qty 1

## 2015-09-01 NOTE — ED Notes (Signed)
Dr. Delo at the bedside 

## 2015-09-01 NOTE — ED Notes (Signed)
Dr. Judd Lienelo back in to speak with pt.
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
## 2015-09-01 NOTE — ED Notes (Addendum)
Dr. Judd Lienelo made aware pt refusing his hand xray. No further orders received at this time. Pt denying pain in his hand
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
## 2015-09-07 ENCOUNTER — Emergency Department (HOSPITAL_COMMUNITY)
Admission: EM | Admit: 2015-09-07 | Discharge: 2015-09-08 | Disposition: A | Discharge status: Home / Self Care | Payer: Self-pay | Attending: Emergency Medicine | Admitting: Emergency Medicine

## 2015-09-07 ENCOUNTER — Encounter (HOSPITAL_COMMUNITY): Payer: Self-pay | Admitting: *Deleted

## 2015-09-07 DIAGNOSIS — S6991XA Unspecified injury of right wrist, hand and finger(s), initial encounter: Secondary | ICD-10-CM | POA: Insufficient documentation

## 2015-09-07 DIAGNOSIS — Y9289 Other specified places as the place of occurrence of the external cause: Secondary | ICD-10-CM | POA: Insufficient documentation

## 2015-09-07 DIAGNOSIS — R52 Pain, unspecified: Secondary | ICD-10-CM

## 2015-09-07 DIAGNOSIS — Y998 Other external cause status: Secondary | ICD-10-CM | POA: Insufficient documentation

## 2015-09-07 DIAGNOSIS — Z23 Encounter for immunization: Secondary | ICD-10-CM | POA: Insufficient documentation

## 2015-09-07 DIAGNOSIS — F17200 Nicotine dependence, unspecified, uncomplicated: Secondary | ICD-10-CM | POA: Insufficient documentation

## 2015-09-07 DIAGNOSIS — Y9389 Activity, other specified: Secondary | ICD-10-CM | POA: Insufficient documentation

## 2015-09-07 DIAGNOSIS — Z8659 Personal history of other mental and behavioral disorders: Secondary | ICD-10-CM | POA: Insufficient documentation

## 2015-09-07 DIAGNOSIS — S29002A Unspecified injury of muscle and tendon of back wall of thorax, initial encounter: Secondary | ICD-10-CM | POA: Insufficient documentation

## 2015-09-07 DIAGNOSIS — Z590 Homelessness: Secondary | ICD-10-CM | POA: Insufficient documentation

## 2015-09-07 DIAGNOSIS — S29001A Unspecified injury of muscle and tendon of front wall of thorax, initial encounter: Secondary | ICD-10-CM | POA: Insufficient documentation

## 2015-09-07 MED ORDER — OXYCODONE HCL 5 MG PO TABS
5.0000 mg | ORAL_TABLET | Freq: Once | ORAL | Status: AC
  Administered 2015-09-07: 5 mg via ORAL
  Filled 2015-09-07: qty 1

## 2015-09-07 MED ORDER — TETANUS-DIPHTH-ACELL PERTUSSIS 5-2.5-18.5 LF-MCG/0.5 IM SUSP
0.5000 mL | Freq: Once | INTRAMUSCULAR | Status: AC
  Administered 2015-09-07: 0.5 mL via INTRAMUSCULAR
  Filled 2015-09-07: qty 0.5
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
## 2015-09-07 NOTE — ED Notes (Signed)
Requesting a tetanus shot, PA aware

## 2015-09-08 ENCOUNTER — Encounter (HOSPITAL_COMMUNITY): Payer: Self-pay

## 2015-09-08 ENCOUNTER — Emergency Department (HOSPITAL_COMMUNITY)
Admission: EM | Admit: 2015-09-08 | Discharge: 2015-09-08 | Discharge status: Against Medical Advice | Payer: Self-pay | Attending: Emergency Medicine | Admitting: Emergency Medicine

## 2015-09-08 DIAGNOSIS — F17210 Nicotine dependence, cigarettes, uncomplicated: Secondary | ICD-10-CM | POA: Insufficient documentation

## 2015-09-08 DIAGNOSIS — R51 Headache: Secondary | ICD-10-CM | POA: Insufficient documentation

## 2015-09-08 NOTE — ED Notes (Signed)
Pt offered room; pt declining to go to exam room, requesting to sleep in lobby
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
## 2015-09-08 NOTE — ED Provider Notes (Signed)
I signed up for patient, but patient was not present in the room he was assigned. When I asked the nurse, she stated that the patient declined to go to the exam room and wanted to sleep in the lobby.   Audry Piliyler Torez Beauregard, PA-C 09/08/15 [id number]  Tomasita CrumbleAdeleke Oni, MD 09/08/15 [phone]
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
## 2015-09-10 ENCOUNTER — Encounter (HOSPITAL_COMMUNITY): Payer: Self-pay | Admitting: Emergency Medicine

## 2015-09-10 ENCOUNTER — Emergency Department (HOSPITAL_COMMUNITY)
Admission: EM | Admit: 2015-09-10 | Discharge: 2015-09-10 | Disposition: A | Discharge status: Home / Self Care | Payer: Self-pay | Attending: Emergency Medicine | Admitting: Emergency Medicine

## 2015-09-10 ENCOUNTER — Encounter (HOSPITAL_COMMUNITY): Payer: Self-pay | Admitting: Nurse Practitioner

## 2015-09-10 DIAGNOSIS — R51 Headache: Secondary | ICD-10-CM | POA: Insufficient documentation

## 2015-09-10 DIAGNOSIS — Y9289 Other specified places as the place of occurrence of the external cause: Secondary | ICD-10-CM | POA: Insufficient documentation

## 2015-09-10 DIAGNOSIS — Z79899 Other long term (current) drug therapy: Secondary | ICD-10-CM | POA: Insufficient documentation

## 2015-09-10 DIAGNOSIS — Y998 Other external cause status: Secondary | ICD-10-CM | POA: Insufficient documentation

## 2015-09-10 DIAGNOSIS — F17200 Nicotine dependence, unspecified, uncomplicated: Secondary | ICD-10-CM | POA: Insufficient documentation

## 2015-09-10 DIAGNOSIS — S01512A Laceration without foreign body of oral cavity, initial encounter: Secondary | ICD-10-CM

## 2015-09-10 DIAGNOSIS — Z8659 Personal history of other mental and behavioral disorders: Secondary | ICD-10-CM | POA: Insufficient documentation

## 2015-09-10 DIAGNOSIS — R110 Nausea: Secondary | ICD-10-CM | POA: Insufficient documentation

## 2015-09-10 DIAGNOSIS — S01511A Laceration without foreign body of lip, initial encounter: Secondary | ICD-10-CM | POA: Insufficient documentation

## 2015-09-10 DIAGNOSIS — Y9389 Activity, other specified: Secondary | ICD-10-CM | POA: Insufficient documentation

## 2015-09-10 DIAGNOSIS — R519 Headache, unspecified: Secondary | ICD-10-CM

## 2015-09-10 MED ORDER — AMOXICILLIN 500 MG PO CAPS: 500.0000 mg | ORAL_CAPSULE | Freq: Three times a day (TID) | ORAL | Status: DC

## 2015-09-10 MED ORDER — MELOXICAM 7.5 MG PO TABS: 15.0000 mg | ORAL_TABLET | Freq: Every day | ORAL | Status: DC

## 2015-09-10 MED ORDER — KETOROLAC TROMETHAMINE 30 MG/ML IJ SOLN
30.0000 mg | Freq: Once | INTRAMUSCULAR | Status: AC
  Administered 2015-09-10: 30 mg via INTRAVENOUS
  Filled 2015-09-10: qty 1

## 2015-09-10 MED ORDER — SODIUM CHLORIDE 0.9 % IV BOLUS (SEPSIS)
1000.0000 mL | Freq: Once | INTRAVENOUS | Status: AC
  Administered 2015-09-10: 1000 mL via INTRAVENOUS

## 2015-09-10 MED ORDER — DIPHENHYDRAMINE HCL 50 MG/ML IJ SOLN
25.0000 mg | Freq: Once | INTRAMUSCULAR | Status: AC
  Administered 2015-09-10: 25 mg via INTRAVENOUS
  Filled 2015-09-10: qty 1

## 2015-09-10 MED ORDER — METOCLOPRAMIDE HCL 5 MG/ML IJ SOLN
10.0000 mg | Freq: Once | INTRAMUSCULAR | Status: AC
  Administered 2015-09-10: 10 mg via INTRAVENOUS
  Filled 2015-09-10: qty 2

## 2015-09-10 MED ORDER — KETOROLAC TROMETHAMINE 10 MG PO TABS
10.0000 mg | ORAL_TABLET | Freq: Once | ORAL | Status: AC
  Administered 2015-09-10: 10 mg via ORAL
  Filled 2015-09-10 (×2): qty 1

## 2015-09-10 NOTE — ED Notes (Signed)
EDP at bedside  

## 2015-09-10 NOTE — ED Notes (Signed)
Pt is homeless. Pt given meal and drink. [facility].

## 2015-09-10 NOTE — ED Notes (Signed)
Pt comes in with c/o of a migraine for the past hour. Pt denies any other complaints. Pt is a smoker. Pt A&OX4, NAD noted.
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
## 2015-09-10 NOTE — Discharge Instructions (Signed)
Take Amoxicillin to prevent infection and Mobic as needed for pain. Swish with warm water after eating to prevent food particles from getting stuck in your wound. Follow up with your primary care doctor for wound recheck as needed.  Mouth Laceration A mouth laceration is a deep cut in the lining of your mouth (mucosa). The laceration may extend into your lip or go all of the way through your mouth and cheek. Lacerations inside your mouth may involve your tongue, the insides of your cheeks, or the upper surface of your mouth (palate). Mouth lacerations may bleed a lot because your mouth has a very rich blood supply. Mouth lacerations may need to be repaired with stitches (sutures). CAUSES Any type of facial injury can cause a mouth laceration. Common causes include:  Getting hit in the mouth.  Being in a car accident. SYMPTOMS The most common sign of a mouth laceration is bleeding that fills the mouth. DIAGNOSIS Your health care provider can diagnose a mouth laceration by examining your mouth. Your mouth may need to be washed out (irrigated) with a sterile salt-water (saline) solution. Your health care provider may also have to remove any blood clots to determine how bad your injury is. You may need X-rays of the bones in your jaw or your face to rule out other injuries, such as dental injuries, facial fractures, or jaw fractures. TREATMENT Treatment depends on the location and severity of your injury. Small mouth lacerations may not need treatment if bleeding has stopped. You may need sutures if:  You have a tongue laceration.  Your mouth laceration is large or deep, or it continues to bleed. If sutures are necessary, your health care provider will use absorbable sutures that dissolve as your body heals. You may also receive antibiotic medicine or a tetanus shot. HOME CARE INSTRUCTIONS  Take medicines only as directed by your health care provider.  If you were prescribed an antibiotic  medicine, finish all of it even if you start to feel better.  Eat as directed by your health care provider. You may only be able to drink liquids or eat soft foods for a few days.  Rinse your mouth with a warm, salt-water rinse 4-6 times per day or as directed by your health care provider. You can make a salt-water rinse by mixing one tsp of salt into two cups of warm water.  Do not poke the sutures with your tongue. Doing that can loosen them.  Check your wound every day for signs of infection. It is normal to have a white or gray patch over your wound while it heals. Watch for:  Redness.  Swelling.  Blood or pus.  Maintain regular oral hygiene, if possible. Gently brush your teeth with a soft, nylon-bristled toothbrush 2 times per day.  Keep all follow-up visits as directed by your health care provider. This is important. SEEK MEDICAL CARE IF:  You were given a tetanus shot and have swelling, severe pain, redness, or bleeding at the injection site.  You have a fever.  Your pain is not controlled with medicine.  You have redness, swelling, or pain at your wound that is getting worse.  You have fresh bleeding or pus coming from your wound.  The edges of your wound break open.  You develop swollen, tender glands in your throat. SEEK IMMEDIATE MEDICAL CARE IF:   Your face or the area under your jaw becomes swollen.  You have trouble breathing or swallowing.   This information is  not intended to replace advice given to you by your health care provider. Make sure you discuss any questions you have with your health care provider.   Document Released: 08/20/2005 Document Revised: 01/04/2015 Document Reviewed: 08/11/2014 Elsevier Interactive Patient Education 2016 [employer].  Emergency Department Resource Guide 1) Find a Doctor and Pay Out of Pocket Although you won't have to find out who is covered by your insurance plan, it is a good idea to ask around and get  recommendations. You will then need to call the office and see if the doctor you have chosen will accept you as a new patient and what types of options they offer for patients who are self-pay. Some doctors offer discounts or will set up payment plans for their patients who do not have insurance, but you will need to ask so you aren't surprised when you get to your appointment.  2) Contact Your Local Health Department Not all health departments have doctors that can see patients for sick visits, but many do, so it is worth a call to see if yours does. If you don't know where your local health department is, you can check in your phone book. The CDC also has a tool to help you locate your state's health department, and many state websites also have listings of all of their local health departments.  3) Find a Walk-in Clinic If your illness is not likely to be very severe or complicated, you may want to try a walk in clinic. These are popping up all over the country in pharmacies, drugstores, and shopping centers. They're usually staffed by nurse practitioners or physician assistants that have been trained to treat common illnesses and complaints. They're usually fairly quick and inexpensive. However, if you have serious medical issues or chronic medical problems, these are probably not your best option.  No Primary Care Doctor: - Call Health Connect at  [phone] - they can help you locate a primary care doctor that  accepts your insurance, provides certain services, etc. - Physician Referral Service- [phone]  Chronic Pain Problems: Organization         Address  Phone   Notes  [facility]  [phone] Patients need to be referred by their primary care doctor.   Medication Assistance: Organization         Address  Phone   Notes  [facility] Medication [facility] [address] [place], [place] [address] [phone] --Must be a resident of  [facility] -- Must have NO insurance coverage whatsoever (no Medicaid/ Medicare, etc.) -- The pt. MUST have a primary care doctor that directs their care regularly and follows them in the community   [facility]  [phone]   [employer]  [phone]    Agencies that provide inexpensive medical care: Organization         Address  Phone   Notes  [facility]  [phone]   [facility]    [phone]   [facility] [address] [place], [place] [address] [phone]   [facility] [address] [place], [place] [phone]   [facility]anned Parenthood    [phone]   [facility]    [phone]   [facility]  [address], [place] Phone:  [phone], Fax:  [phone] Hours of Operation:  9 am - 6 pm, M-F.  Also accepts Medicaid/Medicare and self-pay.  [facility] for [place] [place], [address], [place] Phone: [phone], Fax: [phone]. Hours of Operation:  8:30 am - 5:30 pm, M-F.  Also accepts Medicaid and self-pay.  [facility] [place] [address], [place] Phone: [phone]   [employer], [place], [place] [phone], Ext. [phone] Mondays & Thursdays: 7-9 AM.  First 15 patients are seen on a first come, first serve basis.    [place] Providers:  Organization         Address  Phone   Notes  [facility] [address], [place] [phone] Also accepts self-pay patients.  [facility] [address], [place]  [phone]   [place], [address], [place] [phone]   [facility] [address], [place] [phone]   Lucianne Lei [address], [place]   [phone] Only accepts [place] Access  [place] patients after they have their name applied to their card.   Self-Pay (no insurance) in [facility]:  Organization         Address  Phone   Notes  Sickle Cell Patients, [facility] [place] [phone]   [facility] [name] [phone]   [facility] [place], [place], [place] [phone]   [facility]/Dr. Osei-Bonsu  [address], [place] [phone] Phone number for both [place] and [place] locations is the same.  [facility] [address], [place] [phone]   [facility] [address] [place] or [address] [phone]   [facility] [address] [place] [phone], phone; [phone], fax Sees patients 1st and 3rd Saturday of every month.  Must not qualify for public or private insurance (i.e. Medicaid, Medicare, [place] Health Choice, Veterans' Benefits)  Household income should be no more than 200% of the poverty level The clinic cannot treat you if you are pregnant or think you are pregnant  Sexually transmitted diseases are not treated at the clinic.    Dental Care: Organization         Address  Phone  Notes  [facility] Department of [facility] [place] [phone] Accepts children up to age 81 who are enrolled in [place] or [place]; pregnant women with a Medicaid card; and children who have applied for Medicaid or [place] Health Choice, but were declined, whose parents can pay a reduced fee at time of service.  [facility]  [address], [place] [phone] Accepts children up to age 72 who are enrolled in [place] or [place]; pregnant women with a Medicaid  card; and children who have applied for Medicaid or [place] Health Choice, but were declined, whose parents can pay a reduced fee at time of service.  [facility]  [place] [phone] Patients are seen by appointment only. Walk-ins are not accepted. [place] will see patients 9 years of age and older. Monday - Tuesday (  8am-5pm) Most Wednesdays (8:30-5pm) $30 per visit, cash only  [facility]  [address], [facility] [phone] Patients are seen by appointment only. Walk-ins are not accepted. [facility] will see patients 23 years of age and older. One Wednesday Evening (Monthly: Volunteer Based).  $30 per visit, cash only  [facility]  [phone] for adults; Children under age 414, call Graduate Pediatric Dentistry at [phone]. Children aged 724-14, please call [phone] to request a pediatric application.  Dental services are provided in all areas of dental care including fillings, crowns and bridges, complete and partial dentures, implants, gum treatment, root canals, and extractions. Preventive care is also provided. Treatment is provided to both adults and children. Patients are selected via a lottery and there is often a waiting list.   [facility] [address], [place]  [phone] [email or web address]   [facility] [address], [place], [place] [phone], Ext. [phone] Second and Fourth Thursday of each month, opens at 6:30 AM; Clinic ends at 9 AM.  Patients are seen on a first-come first-served basis, and a limited number are seen during each clinic.   [facility]  [address], [place], [place] [phone]   Eligibility Requirements You must have lived in [place], [place], or [place] counties for at least the last three months.   You cannot be eligible for state or federal sponsored [place]care insurance,  including CIGNAVeterans Administration, [place]icaid, or [employer]icare.   You generally cannot be eligible for healthcare insurance through your employer.    How to apply: Eligibility screenings are held every Tuesday and Wednesday afternoon from 1:00 pm until 4:00 pm. You do not need an appointment for the interview!  [facility] [address], [place], [place] [phone]   [facility]  [phone]   [facility]  [phone]   [facility]  [phone]

## 2015-09-10 NOTE — Discharge Instructions (Signed)
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
## 2015-09-11 ENCOUNTER — Encounter (HOSPITAL_COMMUNITY): Payer: Self-pay | Admitting: Emergency Medicine

## 2015-09-11 ENCOUNTER — Emergency Department (HOSPITAL_COMMUNITY)
Admission: EM | Admit: 2015-09-11 | Discharge: 2015-09-11 | Disposition: A | Discharge status: Home / Self Care | Payer: Self-pay | Attending: Emergency Medicine | Admitting: Emergency Medicine

## 2015-09-11 ENCOUNTER — Emergency Department (HOSPITAL_COMMUNITY)
Admission: EM | Admit: 2015-09-11 | Discharge: 2015-09-12 | Disposition: A | Discharge status: Other Acute Inpatient Hospital | Payer: Self-pay | Attending: Emergency Medicine | Admitting: Emergency Medicine

## 2015-09-11 ENCOUNTER — Encounter (HOSPITAL_COMMUNITY): Payer: Self-pay

## 2015-09-11 DIAGNOSIS — Z880 Allergy status to penicillin: Secondary | ICD-10-CM | POA: Insufficient documentation

## 2015-09-11 DIAGNOSIS — F32A Depression, unspecified: Secondary | ICD-10-CM

## 2015-09-11 DIAGNOSIS — J069 Acute upper respiratory infection, unspecified: Secondary | ICD-10-CM | POA: Insufficient documentation

## 2015-09-11 DIAGNOSIS — Z792 Long term (current) use of antibiotics: Secondary | ICD-10-CM | POA: Insufficient documentation

## 2015-09-11 DIAGNOSIS — Z791 Long term (current) use of non-steroidal anti-inflammatories (NSAID): Secondary | ICD-10-CM | POA: Insufficient documentation

## 2015-09-11 DIAGNOSIS — F329 Major depressive disorder, single episode, unspecified: Secondary | ICD-10-CM | POA: Insufficient documentation

## 2015-09-11 DIAGNOSIS — F17210 Nicotine dependence, cigarettes, uncomplicated: Secondary | ICD-10-CM | POA: Insufficient documentation

## 2015-09-11 DIAGNOSIS — Z8659 Personal history of other mental and behavioral disorders: Secondary | ICD-10-CM | POA: Insufficient documentation

## 2015-09-11 DIAGNOSIS — Z590 Homelessness: Secondary | ICD-10-CM | POA: Insufficient documentation

## 2015-09-11 DIAGNOSIS — F1210 Cannabis abuse, uncomplicated: Secondary | ICD-10-CM | POA: Insufficient documentation

## 2015-09-11 DIAGNOSIS — Z87828 Personal history of other (healed) physical injury and trauma: Secondary | ICD-10-CM | POA: Insufficient documentation

## 2015-09-11 DIAGNOSIS — Z79899 Other long term (current) drug therapy: Secondary | ICD-10-CM | POA: Insufficient documentation

## 2015-09-11 MED ORDER — LORAZEPAM 1 MG PO TABS: 1.0000 mg | ORAL_TABLET | Freq: Three times a day (TID) | ORAL | Status: DC | PRN

## 2015-09-11 MED ORDER — NICOTINE 21 MG/24HR TD PT24
21.0000 mg | MEDICATED_PATCH | Freq: Once | TRANSDERMAL | Status: DC
  Administered 2015-09-12: 21 mg via TRANSDERMAL
  Filled 2015-09-11: qty 1

## 2015-09-11 MED ORDER — ONDANSETRON HCL 4 MG PO TABS: 4.0000 mg | ORAL_TABLET | Freq: Three times a day (TID) | ORAL | Status: DC | PRN
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
## 2015-09-11 NOTE — ED Notes (Signed)
MD at bedside. 

## 2015-09-11 NOTE — ED Notes (Signed)
Patient to ED c/o suicidal ideation - states that he has "been in a funk for a while now," and expresses thoughts of wanting to harm himself. Pt states he does not have a plan at this time. Patient reported that he is also homeless. NAD noted at this time.

## 2015-09-11 NOTE — ED Provider Notes (Signed)
CSN: [id number]     Arrival date & time 09/11/15  2255 History   First MD Initiated Contact with Patient 09/11/15 2329     Chief Complaint  Patient presents with  . Suicidal     (Consider location/radiation/quality/duration/timing/severity/associated sxs/prior Treatment) HPI Comments: Patient presents to the emergency department stating that he is suicidal. Patient was just seen in the ER this evening and did not mention this at all. Patient reports that he has been wanting to hurt himself for several days, however.   Past Medical History  Diagnosis Date  . Schizophrenia (HCC)   . Substance abuse    History reviewed. No pertinent past surgical history. History reviewed. No pertinent family history. Social History  Substance Use Topics  . Smoking status: Current Every Day Smoker -- 0.75 packs/day    Types: Cigarettes  . Smokeless tobacco: None  . Alcohol Use: Yes     Comment: occ- last had beer 09-08-14    Review of Systems  Psychiatric/Behavioral: Positive for suicidal ideas and dysphoric mood.  All other systems reviewed and are negative.     Allergies  Amoxicillin; Ibuprofen; Tylenol; and Other  Home Medications   Prior to Admission medications   Medication Sig Start Date End Date Taking? Authorizing Provider  amoxicillin (AMOXIL) 500 MG capsule Take 1 capsule (500 mg total) by mouth 3 (three) times daily. 09/10/15   Antony MaduraKelly Humes, PA-C  meloxicam (MOBIC) 7.5 MG tablet Take 2 tablets (15 mg total) by mouth daily. 09/10/15   Antony MaduraKelly Humes, PA-C  Pediatric Multiple Vit-C-FA (PEDIATRIC MULTIVITAMIN) chewable tablet Chew 1 tablet by mouth daily.    Historical Provider, MD   BP 126/57 mmHg  Pulse 64  Temp(Src) 98.1 F (36.7 C) (Oral)  Resp 16  SpO2 97% Physical Exam  Constitutional: He is oriented to person, place, and time. He appears well-developed and well-nourished. No distress.  HENT:  Head: Normocephalic and atraumatic.  Right Ear: Hearing normal.  Left Ear: Hearing  normal.  Nose: Nose normal.  Mouth/Throat: Oropharynx is clear and moist and mucous membranes are normal.  Eyes: Conjunctivae and EOM are normal. Pupils are equal, round, and reactive to light.  Neck: Normal range of motion. Neck supple.  Cardiovascular: Regular rhythm, S1 normal and S2 normal.  Exam reveals no gallop and no friction rub.   No murmur heard. Pulmonary/Chest: Effort normal and breath sounds normal. No respiratory distress. He exhibits no tenderness.  Abdominal: Soft. Normal appearance and bowel sounds are normal. There is no hepatosplenomegaly. There is no tenderness. There is no rebound, no guarding, no tenderness at McBurney's point and negative Murphy's sign. No hernia.  Musculoskeletal: Normal range of motion.  Neurological: He is alert and oriented to person, place, and time. He has normal strength. No cranial nerve deficit or sensory deficit. Coordination normal. GCS eye subscore is 4. GCS verbal subscore is 5. GCS motor subscore is 6.  Skin: Skin is warm, dry and intact. No rash noted. No cyanosis.  Psychiatric: His behavior is normal. His speech is delayed. He exhibits a depressed mood. He expresses suicidal ideation.  Nursing note and vitals reviewed.   ED Course  Procedures (including critical care time) Labs Review Labs Reviewed - No data to display  Imaging Review No results found. I have personally reviewed and evaluated these images and lab results as part of my medical decision-making.   EKG Interpretation None      MDM   Final diagnoses:  None   depression  Patient complaining  of depression. Patient reports that he has been depressed for some time but now has reached the point where he feels like harming himself. He has not formulated a plan. I suspect that part of this is because he is homeless and it is cold outside, but when confronted with this, patient reports that he is seriously thinking of harming himself, therefore psychiatric evaluation  will be obtained.    Gilda Crease, MD 09/11/15 2340

## 2015-09-11 NOTE — Discharge Instructions (Signed)
Upper Respiratory Infection, Adult Most upper respiratory infections (URIs) are a viral infection of the air passages leading to the lungs. A URI affects the nose, throat, and upper air passages. The most common type of URI is nasopharyngitis and is typically referred to as "the common cold." URIs run their course and usually go away on their own. Most of the time, a URI does not require medical attention, but sometimes a bacterial infection in the upper airways can follow a viral infection. This is called a secondary infection. Sinus and middle ear infections are common types of secondary upper respiratory infections. Bacterial pneumonia can also complicate a URI. A URI can worsen asthma and chronic obstructive pulmonary disease (COPD). Sometimes, these complications can require emergency medical care and may be life threatening.  CAUSES Almost all URIs are caused by viruses. A virus is a type of germ and can spread from one person to another.  RISKS FACTORS You may be at risk for a URI if:   You smoke.   You have chronic heart or lung disease.  You have a weakened defense (immune) system.   You are very young or very old.   You have nasal allergies or asthma.  You work in crowded or poorly ventilated areas.  You work in health care facilities or schools. SIGNS AND SYMPTOMS  Symptoms typically develop 2-3 days after you come in contact with a cold virus. Most viral URIs last 7-10 days. However, viral URIs from the influenza virus (flu virus) can last 14-18 days and are typically more severe. Symptoms may include:   Runny or stuffy (congested) nose.   Sneezing.   Cough.   Sore throat.   Headache.   Fatigue.   Fever.   Loss of appetite.   Pain in your forehead, behind your eyes, and over your cheekbones (sinus pain).  Muscle aches.  DIAGNOSIS  Your health care provider may diagnose a URI by:  Physical exam.  Tests to check that your symptoms are not due to  another condition such as:  Strep throat.  Sinusitis.  Pneumonia.  Asthma. TREATMENT  A URI goes away on its own with time. It cannot be cured with medicines, but medicines may be prescribed or recommended to relieve symptoms. Medicines may help:  Reduce your fever.  Reduce your cough.  Relieve nasal congestion. HOME CARE INSTRUCTIONS   Take medicines only as directed by your health care provider.   Gargle warm saltwater or take cough drops to comfort your throat as directed by your health care provider.  Use a warm mist humidifier or inhale steam from a shower to increase air moisture. This may make it easier to breathe.  Drink enough fluid to keep your urine clear or pale yellow.   Eat soups and other clear broths and maintain good nutrition.   Rest as needed.   Return to work when your temperature has returned to normal or as your health care provider advises. You may need to stay home longer to avoid infecting others. You can also use a face mask and careful hand washing to prevent spread of the virus.  Increase the usage of your inhaler if you have asthma.   Do not use any tobacco products, including cigarettes, chewing tobacco, or electronic cigarettes. If you need help quitting, ask your health care provider. PREVENTION  The best way to protect yourself from getting a cold is to practice good hygiene.   Avoid oral or hand contact with people with cold   symptoms.   Wash your hands often if contact occurs.  There is no clear evidence that vitamin C, vitamin E, echinacea, or exercise reduces the chance of developing a cold. However, it is always recommended to get plenty of rest, exercise, and practice good nutrition.  SEEK MEDICAL CARE IF:   You are getting worse rather than better.   Your symptoms are not controlled by medicine.   You have chills.  You have worsening shortness of breath.  You have brown or red mucus.  You have yellow or brown nasal  discharge.  You have pain in your face, especially when you bend forward.  You have a fever.  You have swollen neck glands.  You have pain while swallowing.  You have white areas in the back of your throat. SEEK IMMEDIATE MEDICAL CARE IF:   You have severe or persistent:  Headache.  Ear pain.  Sinus pain.  Chest pain.  You have chronic lung disease and any of the following:  Wheezing.  Prolonged cough.  Coughing up blood.  A change in your usual mucus.  You have a stiff neck.  You have changes in your:  Vision.  Hearing.  Thinking.  Mood. MAKE SURE YOU:   Understand these instructions.  Will watch your condition.  Will get help right away if you are not doing well or get worse.   This information is not intended to replace advice given to you by your health care provider. Make sure you discuss any questions you have with your health care provider.   Document Released: 02/13/2001 Document Revised: 01/04/2015 Document Reviewed: 11/25/2013 Elsevier Interactive Patient Education 2016 Elsevier Inc.  
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
## 2015-09-11 NOTE — ED Notes (Signed)
TTS at bedside. BH talking with patient at this time.

## 2015-09-12 ENCOUNTER — Observation Stay (HOSPITAL_COMMUNITY)
Admission: AD | Admit: 2015-09-12 | Discharge: 2015-09-12 | Disposition: A | Discharge status: Home / Self Care | Payer: Self-pay | Source: Intra-hospital | Attending: Psychiatry | Admitting: Psychiatry

## 2015-09-12 ENCOUNTER — Encounter (HOSPITAL_COMMUNITY): Payer: Self-pay | Admitting: *Deleted

## 2015-09-12 DIAGNOSIS — F1190 Opioid use, unspecified, uncomplicated: Secondary | ICD-10-CM | POA: Insufficient documentation

## 2015-09-12 DIAGNOSIS — Z590 Homelessness: Secondary | ICD-10-CM | POA: Insufficient documentation

## 2015-09-12 DIAGNOSIS — F17210 Nicotine dependence, cigarettes, uncomplicated: Secondary | ICD-10-CM | POA: Insufficient documentation

## 2015-09-12 DIAGNOSIS — F251 Schizoaffective disorder, depressive type: Principal | ICD-10-CM | POA: Insufficient documentation

## 2015-09-12 DIAGNOSIS — Z886 Allergy status to analgesic agent status: Secondary | ICD-10-CM | POA: Insufficient documentation

## 2015-09-12 DIAGNOSIS — R45851 Suicidal ideations: Secondary | ICD-10-CM | POA: Insufficient documentation

## 2015-09-12 DIAGNOSIS — Z880 Allergy status to penicillin: Secondary | ICD-10-CM | POA: Insufficient documentation

## 2015-09-12 DIAGNOSIS — F32A Depression, unspecified: Secondary | ICD-10-CM | POA: Diagnosis present

## 2015-09-12 DIAGNOSIS — F1290 Cannabis use, unspecified, uncomplicated: Secondary | ICD-10-CM | POA: Insufficient documentation

## 2015-09-12 DIAGNOSIS — Z888 Allergy status to other drugs, medicaments and biological substances status: Secondary | ICD-10-CM | POA: Insufficient documentation

## 2015-09-12 DIAGNOSIS — F329 Major depressive disorder, single episode, unspecified: Secondary | ICD-10-CM

## 2015-09-12 LAB — COMPREHENSIVE METABOLIC PANEL
ALBUMIN: 4 g/dL (ref 3.5–5.0)
ALT: 18 U/L (ref 17–63)
AST: 24 U/L (ref 15–41)
Alkaline Phosphatase: 82 U/L (ref 38–126)
Anion gap: 10 (ref 5–15)
BUN: 9 mg/dL (ref 6–20)
CHLORIDE: 103 mmol/L (ref 101–111)
CO2: 28 mmol/L (ref 22–32)
CREATININE: 1.03 mg/dL (ref 0.61–1.24)
Calcium: 9.1 mg/dL (ref 8.9–10.3)
GFR calc Af Amer: >60 mL/min (ref >60)
GFR calc non Af Amer: >60 mL/min (ref >60)
GLUCOSE: 85 mg/dL (ref 65–99)
POTASSIUM: 4.4 mmol/L (ref 3.5–5.1)
SODIUM: 141 mmol/L (ref 135–145)
Total Bilirubin: 1.3 mg/dL — ABNORMAL HIGH (ref 0.3–1.2)
Total Protein: 6.4 g/dL — ABNORMAL LOW (ref 6.5–8.1)

## 2015-09-12 LAB — RAPID URINE DRUG SCREEN, HOSP PERFORMED
AMPHETAMINES: NOT DETECTED
BARBITURATES: NOT DETECTED
BENZODIAZEPINES: NOT DETECTED
Cocaine: NOT DETECTED
Opiates: NOT DETECTED
Tetrahydrocannabinol: POSITIVE — AB

## 2015-09-12 LAB — CBC WITH DIFFERENTIAL/PLATELET
BASOS ABS: 0 10*3/uL (ref 0.0–0.1)
Basophils Relative: 0 %
EOS PCT: 2 %
Eosinophils Absolute: 0.2 10*3/uL (ref 0.0–0.7)
HEMATOCRIT: 44.3 % (ref 39.0–52.0)
Hemoglobin: 15.3 g/dL (ref 13.0–17.0)
LYMPHS ABS: 2.7 10*3/uL (ref 0.7–4.0)
LYMPHS PCT: 31 %
MCH: 30.4 pg (ref 26.0–34.0)
MCHC: 34.5 g/dL (ref 30.0–36.0)
MCV: 87.9 fL (ref 78.0–100.0)
MONO ABS: 1 10*3/uL (ref 0.1–1.0)
Monocytes Relative: 11 %
NEUTROS ABS: 4.8 10*3/uL (ref 1.7–7.7)
Neutrophils Relative %: 56 %
PLATELETS: 221 10*3/uL (ref 150–400)
RBC: 5.04 MIL/uL (ref 4.22–5.81)
RDW: 13.5 % (ref 11.5–15.5)
WBC: 8.7 10*3/uL (ref 4.0–10.5)

## 2015-09-12 LAB — ETHANOL: Alcohol, Ethyl (B): <5 mg/dL (ref <5)

## 2015-09-12 MED ORDER — MAGNESIUM HYDROXIDE 400 MG/5ML PO SUSP: 30.0000 mL | Freq: Every day | ORAL | Status: DC | PRN

## 2015-09-12 MED ORDER — CHILDRENS CHEWABLE MULTI VITS PO CHEW: 1.0000 | CHEWABLE_TABLET | Freq: Every day | ORAL | Status: DC

## 2015-09-12 MED ORDER — ALUM & MAG HYDROXIDE-SIMETH 200-200-20 MG/5ML PO SUSP: 30.0000 mL | ORAL | Status: DC | PRN

## 2015-09-12 NOTE — [facility] Counselor (Signed)
Pt. discussed with this writer short history of substance abuse. Pt. acknowledged alcohol problem and other substances [unspecified], but is at this point of time not willing to quit or seek help for substance abuse. Pt appears to be in pre-contemplation stage of change. Jennel Mara K. Jesus GeneraHarris,  LPC-A, [facility]  Counselor 09/12/2015 1:47 PM

## 2015-09-12 NOTE — [facility] Counselor (Signed)
Spoke with patient this a.m. After consult with Fransisca KaufmannLaura Davis, NP. Pt was not very verbal, and gave contact info for Fayrene FearingJames grandfather [phone] and Father "Theodoro GristDave" at [phone]. This writer attempted to call contacts upon verbal consent from patient for collateral info. No answer at either phone, and no voice message left.  Upon speaking with patient, pt. did express past history of substance abuse with meth., and some experimentation with "shrooms." Patient did express that he would like to speak more later, and expressed some interest in residential substance abuse treatment.   Jerod Mcquain K. Sherlon HandingHarris, LCAS-A, LPC-A, [facility]  Counselor 09/12/2015 10:03 AM

## 2015-09-12 NOTE — [facility] Counselor (Signed)
Patient signed for belongings and acknowledged understanding of d/c instructions/ signed d/c papers. Pt.d/c and given resources as per requested. Kyheem Bathgate K. Jesus GeneraHarris, LPC-A, [facility]  Counselor 09/12/2015 1:45 PM
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
## 2015-09-12 NOTE — BHH Counselor (Signed)
Patient has received all belongings and signed for them as well as signing the Discharge Summary and also given a copy of same. Patient denies any SI/HI/AVH and states understanding of followup appointments and medications prescribed. Patient also given  bus pass. Patient escorted from Unit by staff member to Lobby to catch the bus. Soha Thorup K. Jesus GeneraHarris, LPC-A, [facility]  Counselor 09/12/2015 1:50 PM

## 2015-09-12 NOTE — Progress Notes (Signed)
[facility] INPATIENT:  Family/Significant Other Suicide Prevention Education  Suicide Prevention Education:  Patient Refusal for Family/Significant Other Suicide Prevention Education: The patient Marc Hart has refused to provide written consent for family/significant other to be provided Family/Significant Other Suicide Prevention Education during admission and/or prior to discharge.  Physician notified.  Glenice LaineIbekwe, Jasin Brazel B 09/12/2015, 5:51 AM

## 2015-09-12 NOTE — H&P (Signed)
Obs Admission Assessment Adult  Patient Identification: Marc Hart MRN:  [id number] Date of Evaluation:  09/12/2015 Chief Complaint:  unspecified depressive disorder Principal Diagnosis: <principal problem not specified> Diagnosis:   Patient Active Problem List   Diagnosis Date Noted  . Depressive disorder [F32.9] 09/12/2015  . History of schizophrenia [Z86.59]   . Schizoaffective disorder ([place]) [F25.9] 12/18/2014   History of Present Illness:: Marc Hart is an 25 y.o. male, single, Caucasian who presents unaccompanied to [facility] reporting suicidal ideation. Pt presented to the ED earlier today for physical pain and says he was discharged without any pain medication, felt bad and then returned. He reports he has a history of mental health treatment but does not know his diagnosis. He states he is not current taking any psychiatric medication but has been prescribed medication in the past. Pt chart indicates that Pt was diagnosed with schizophrenia in prison and prescribed Haldol because Pt reported he was hearing voices. Pt has not experienced hallucinations in over a year and was diagnosed with substance-induced mood disorder by Dr. Donnelly Angelica. Pt reports he has current suicidal ideation with no plan. He denies any history of previous suicide attempts. He denies any intentional self-injurious behavior. Pt reports symptoms including social withdrawal, loss of interest in usual pleasures, fatigue, irritability, decreased concentration and feelings of guilt and hopelessness. He denies homicidal ideation or history of violence. He denies any recent psychotic symptoms.   Pt reports he "drinks to get drunk" when he has access to alcohol, which Pt states is infrequent. Pt also reports infrequent marijuana use because he cannot afford it. He denies other substance use. Blood alcohol level and urine drug screen are pending.  Pt reports he has been homeless for two years and currently  sleeps under a bridge. The current temperature in [place] is 15 degrees and Pt states his feet and legs are sore from falling on snow and ice. He is unemployed. He cannot identify anyone who is supportive. He states he is on probation for attempting to break into a vehicle and he was incarcerated in 2015 for selling drugs.  Pt is dressed in hospital gown, alert, oriented x4 with normal speech and normal motor behavior. Eye contact is good. Pt's mood is depressed and affect is congruent with mood. Pt appeared anxious at times and said during assessment "I feel like these are trick questions." Thought process is coherent and relevant. There is no indication Pt is currently responding to internal stimuli or experiencing delusional thought content. Pt is agreeable to inpatient psychiatric treatment if recommended.  Obs UNit.  HPI:  Patient interviewed. Chart reviewed. Old notes in psychiatric treatment reviewed. Current labs reviewed and case discussed with TTS. 25 year old male says he has a history of depression but unable to report any symptoms.  He reports his depression started a couple of weeks ago when it started to get cold.  He reports [facility] but when told that there is no record, he says 'it was 10 years ago, may be it was at [facility]".  Patient endorses SI, to a plan says "I don't know what you are asking me". Patient denies any intentional self-harm. Patient denies hearing voices at this time.  Patient answers to questions about drug/alcohol use "i don't know".  UDS is positive for only cannabis.  Patient is alert and oriented x4, to person, place, time and situation.  Patient is very guarded and unwilling to answer some questions.  Patient is not delusional or paranoid.   Social history: Patient reports living alone at home.  He reports unemployed but seeking employment.  When confronted with previous report of being homeless for 2 years, states " i  don't know what you are asking me".  He states he does not know where his mom and dad live and does not know if he has siblings or not.   Medical history: Patient denies any medical issues.   Past Psychiatric History: Patient denies receiving psychiatric care at this time.  He reports last psychiatric treatment was in [place].    Associated Signs/Symptoms: Depression Symptoms:  depressed mood, (Hypo) Manic Symptoms:  None reported Anxiety Symptoms:  None reported Psychotic Symptoms:  None reported PTSD Symptoms: None reported Total Time spent with patient: 30 minutes  Past Psychiatric History: HPI  Risk to Self: Is patient at risk for suicide?: No Risk to Others:   Prior Inpatient Therapy:   Prior Outpatient Therapy:    Alcohol Screening:   Substance Abuse History in the last 12 months:  Yes.   Consequences of Substance Abuse: Legal Consequences:  Incarcerated in 2015 for selling drugs and on probation for attempting to break into a vehicle. Family Consequences:  Reports does not know where mom and dad live Previous Psychotropic Medications: Yes  Psychological Evaluations: None reported Past Medical History:  Past Medical History  Diagnosis Date  . Schizophrenia ([place])   . Substance abuse    History reviewed. No pertinent past surgical history. Family History: History reviewed. No pertinent family history. Family Psychiatric  History: HPI Social History:  History  Alcohol Use  . Yes    Comment: occ- last had beer 09-08-14     History  Drug Use  . Yes  . Special: Marijuana    Comment: heroin    Social History   Social History  . Marital Status: Single    Spouse Name: N/A  . Number of Children: N/A  . Years of Education: N/A   Social History Main Topics  . Smoking status: Current Every Day Smoker -- 0.75 packs/day    Types: Cigarettes  . Smokeless tobacco: None  . Alcohol Use: Yes     Comment: occ- last had beer 09-08-14  . Drug Use: Yes    Special: Marijuana      Comment: heroin  . Sexual Activity: Not Asked   Other Topics Concern  . None   Social History Narrative   Additional Social History:                         Allergies:   Allergies  Allergen Reactions  . Amoxicillin Anaphylaxis  . Ibuprofen Other (See Comments)    Messes with his chemicals  . Tylenol [Acetaminophen] Itching  . Other Rash    Allergic reaction to coppertone suntan lotion   Lab Results:  Results for orders placed or performed during the hospital encounter of 09/11/15 (from the past 48 hour(s))  Urine rapid drug screen (hosp performed)not at [facility]     Status: Abnormal   Collection Time: 09/12/15 12:35 AM  Result Value Ref Range   Opiates NONE DETECTED NONE DETECTED   Cocaine NONE DETECTED NONE DETECTED   Benzodiazepines NONE DETECTED NONE DETECTED   Amphetamines NONE DETECTED NONE DETECTED   Tetrahydrocannabinol POSITIVE (A) NONE DETECTED   Barbiturates NONE DETECTED NONE DETECTED    Comment:        DRUG SCREEN FOR MEDICAL PURPOSES ONLY.  IF CONFIRMATION IS NEEDED FOR ANY PURPOSE, NOTIFY LAB WITHIN 5 DAYS.        LOWEST DETECTABLE LIMITS FOR URINE DRUG SCREEN Drug Class       Cutoff (ng/mL) Amphetamine      1000 Barbiturate      200 Benzodiazepine   354 Tricyclics       656 Opiates          300 Cocaine          300 THC              50   Comprehensive metabolic panel     Status: Abnormal   Collection Time: 09/12/15 12:49 AM  Result Value Ref Range   Sodium 141 135 - 145 mmol/L   Potassium 4.4 3.5 - 5.1 mmol/L   Chloride 103 101 - 111 mmol/L   CO2 28 22 - 32 mmol/L   Glucose, Bld 85 65 - 99 mg/dL   BUN 9 6 - 20 mg/dL   Creatinine, Ser 1.03 0.61 - 1.24 mg/dL   Calcium 9.1 8.9 - 10.3 mg/dL   Total Protein 6.4 (L) 6.5 - 8.1 g/dL   Albumin 4.0 3.5 - 5.0 g/dL   AST 24 15 - 41 U/L   ALT 18 17 - 63 U/L   Alkaline Phosphatase 82 38 - 126 U/L   Total Bilirubin 1.3 (H) 0.3 - 1.2 mg/dL   GFR calc non Af Amer >60 >60 mL/min   GFR calc Af  Amer >60 >60 mL/min    Comment: (NOTE) The eGFR has been calculated using the CKD EPI equation. This calculation has not been validated in all clinical situations. eGFR's persistently <60 mL/min signify possible Chronic Kidney Disease.    Anion gap 10 5 - 15  Ethanol     Status: None   Collection Time: 09/12/15 12:49 AM  Result Value Ref Range   Alcohol, Ethyl (B) <5 <5 mg/dL    Comment:        LOWEST DETECTABLE LIMIT FOR SERUM ALCOHOL IS 5 mg/dL FOR MEDICAL PURPOSES ONLY   CBC with Diff     Status: None   Collection Time: 09/12/15 12:49 AM  Result Value Ref Range   WBC 8.7 4.0 - 10.5 K/uL   RBC 5.04 4.22 - 5.81 MIL/uL   Hemoglobin 15.3 13.0 - 17.0 g/dL   HCT 44.3 39.0 - 52.0 %   MCV 87.9 78.0 - 100.0 fL   MCH 30.4 26.0 - 34.0 pg   MCHC 34.5 30.0 - 36.0 g/dL   RDW 13.5 11.5 - 15.5 %   Platelets 221 150 - 400 K/uL   Neutrophils Relative % 56 %   Neutro Abs 4.8 1.7 - 7.7 K/uL   Lymphocytes Relative 31 %   Lymphs Abs 2.7 0.7 - 4.0 K/uL   Monocytes Relative 11 %   Monocytes Absolute 1.0 0.1 - 1.0 K/uL   Eosinophils Relative 2 %   Eosinophils Absolute 0.2 0.0 - 0.7 K/uL   Basophils Relative 0 %   Basophils Absolute 0.0 0.0 - 0.1 K/uL    Metabolic Disorder Labs:  No results found for: HGBA1C, MPG No results found for: PROLACTIN No results found for: CHOL, TRIG, HDL, CHOLHDL, VLDL, LDLCALC  Current Medications: Current Facility-Administered Medications  Medication Dose Route Frequency Provider Last Rate Last Dose  . alum & mag hydroxide-simeth (MAALOX/MYLANTA) 200-200-20 MG/5ML suspension 30 mL  30 mL Oral Q4H PRN Harriet Butte, NP      . magnesium  hydroxide (MILK OF MAGNESIA) suspension 30 mL  30 mL Oral Daily PRN Harriet Butte, NP       PTA Medications: Prescriptions prior to admission  Medication Sig Dispense Refill Last Dose  . amoxicillin (AMOXIL) 500 MG capsule Take 1 capsule (500 mg total) by mouth 3 (three) times daily. 30 capsule 0   . meloxicam (MOBIC)  7.5 MG tablet Take 2 tablets (15 mg total) by mouth daily. 20 tablet 0   . Pediatric Multiple Vit-C-FA (PEDIATRIC MULTIVITAMIN) chewable tablet Chew 1 tablet by mouth daily.   09/10/2015 at Unknown time    Musculoskeletal: Strength & Muscle Tone: within normal limits Gait & Station: normal Patient leans: Right  Psychiatric Specialty Exam: Physical Exam  ROS  Blood pressure 120/82, pulse 69, temperature 98.1 F (36.7 C), temperature source Oral, resp. rate 18, height 5' 10"  (1.778 m), weight 89.359 kg (197 lb), SpO2 100 %.Body mass index is 28.27 kg/(m^2).  General Appearance: Disheveled  Eye Contact::  Good  Speech:  Clear and Coherent  Volume:  Normal  Mood:  Depressed  Affect:  Appropriate and Congruent  Thought Process:  Coherent and Intact  Orientation:  Full (Time, Place, and Person)  Thought Content:  WDL  Suicidal Thoughts:  Yes.  without intent/plan  Homicidal Thoughts:  No  Memory:  Immediate;   unclear Recent;   Unclear because guarded Remote;   Unclear because guarded  Judgement:  Intact  Insight:  not sure  Psychomotor Activity:  Normal  Concentration:  Good  Recall:  when willing to disclose  Fund of Knowledge:Good  Language: Good  Akathisia:  Negative  Handed:  Right  AIMS (if indicated):     Assets:  Communication Skills Desire for Improvement Leisure Time Physical Health Resilience  ADL's:  Intact  Cognition: WNL  Sleep:        Observation Level/Precautions:  Continuous Observation  Laboratory:  per edp  Psychotherapy:    Medications:    Consultations:    Discharge Concerns:    Estimated LOS:  Other:     Treatment Plan Summary: Daily contact with patient to assess and evaluate symptoms and progress in treatment, Medication management and  Plan to keep in Obs unit for another 24 hours for evaluation It is unclear if not unwilling to disclose information or something else going on.  Disposition Plan to continue stay at Obs unit for 24  hours.  Marc Hart, PMHNP-BC 1/9/20177:54 AM

## 2015-09-12 NOTE — Discharge Instructions (Signed)
Your are encouraged to follow up with outpatient services. A list of services available requested have been provided for you.

## 2015-09-12 NOTE — ED Notes (Signed)
[facility]

## 2015-09-12 NOTE — Discharge Summary (Signed)
[facility]-Observation Unit Discharge Summary Note  Patient:  Marc Hart is an 25 y.o., male MRN:  [id number] DOB:  11-16-1990 Patient phone:  [phone] (home)  Patient address:   [address] [place] [address],  Total Time spent with patient: 30 minutes  Date of Admission:  09/12/2015 Date of Discharge: 09/12/2015  Reason for Admission:  Depressive Symptoms  Principal Problem: <principal problem not specified> Discharge Diagnoses: Patient Active Problem List   Diagnosis Date Noted  . Depressive disorder [F32.9] 09/12/2015  . History of schizophrenia [Z86.59]   . Schizoaffective disorder ([facility]) [F25.9] 12/18/2014    Past Psychiatric History: See H & P  Past Medical History:  Past Medical History  Diagnosis Date  . Schizophrenia ([facility])   . Substance abuse    History reviewed. No pertinent past surgical history. Family History: History reviewed. No pertinent family history. Family Psychiatric  History: See H & P Social History:  History  Alcohol Use  . Yes    Comment: occ- last had beer 09-08-14     History  Drug Use  . Yes  . Special: Marijuana    Comment: heroin    Social History   Social History  . Marital Status: Single    Spouse Name: N/A  . Number of Children: N/A  . Years of Education: N/A   Social History Main Topics  . Smoking status: Current Every Day Smoker -- 0.75 packs/day    Types: Cigarettes  . Smokeless tobacco: None  . Alcohol Use: Yes     Comment: occ- last had beer 09-08-14  . Drug Use: Yes    Special: Marijuana     Comment: heroin  . Sexual Activity: Not Asked   Other Topics Concern  . None   Social History Narrative    Hospital Course:     Per initial assessment from 09/12/2015 by Mardene Sayer, counselor:   Marc Hart is an 25 y.o. male, single, Caucasian who presents unaccompanied to [facility] ED reporting suicidal ideation. Pt presented to the ED earlier today for physical pain and says he was discharged without any  pain medication, felt bad and then returned. He reports he has a history of mental health treatment but does not know his diagnosis. He states he is not current taking any psychiatric medication but has been prescribed medication in the past. Pt chart indicates that Pt was diagnosed with schizophrenia in prison and prescribed Haldol because Pt reported he was hearing voices. Pt has not experienced hallucinations in over a year and was diagnosed with substance-induced mood disorder by Dr. Carolanne Grumbling. Pt reports he has current suicidal ideation with no plan. He denies any history of previous suicide attempts. He denies any intentional self-injurious behavior. Pt reports symptoms including social withdrawal, loss of interest in usual pleasures, fatigue, irritability, decreased concentration and feelings of guilt and hopelessness. He denies homicidal ideation or history of violence. He denies any recent psychotic symptoms. Pt reports he "drinks to get drunk" when he has access to alcohol, which Pt states is infrequent. Pt also reports infrequent marijuana use because he cannot afford it. He denies other substance use. Blood alcohol level and urine drug screen are pending. Pt reports he has been homeless for two years and currently sleeps under a bridge. The current temperature in [place] is 15 degrees and Pt states his feet and legs are sore from falling on snow and ice. He is unemployed. He cannot identify anyone who is supportive. He states  he is on probation for attempting to break into a vehicle and he was incarcerated in 2015 for selling drugs.   The patient was admitted to the [facility] Unit for further monitoring and for treatment planning. Marc Hart was extremely vague about his reasons for presenting to the [facility] during initial assessments. It appeared his reasons for presenting were due to stress from being homeless in the cold. Patient was unable to identify any other problems than being homeless  for the last two years. Marc Hart was unable to explain why he was in such a situation. Patient provided contact numbers for his father and grandfather but neither were available by phone. He later discussed with counselor in the Observation Unit that he suffers with an alcohol problem. Patient also expressed frustration over not being able to get a job. Patient did not report taking any routine medications for psychiatric or medical reasons. There were no signs of psychotic symptoms. He denied any depressive symptoms. Marc Hart reported that it has been difficult to live outside recently because of the extreme cold. He was referred to the [facility] to obtain resources for housing and other services that would be available to him. Patient denied any suicidal ideation prior to his discharge from the hospital.   Physical Findings: AIMS:  , ,  ,  ,    CIWA:    COWS:     Musculoskeletal: Strength & Muscle Tone: within normal limits Gait & Station: normal Patient leans: N/A  Psychiatric Specialty Exam: Review of Systems  Constitutional: Negative.   HENT: Negative.   Eyes: Negative.   Respiratory: Negative.   Cardiovascular: Negative.   Gastrointestinal: Negative.   Genitourinary: Negative.   Musculoskeletal: Negative.   Skin: Negative.   Neurological: Negative.   Endo/Heme/Allergies: Negative.   Psychiatric/Behavioral: Positive for depression (Stable ) and substance abuse (Positive for marijuana on admission ). Negative for suicidal ideas, hallucinations and memory loss. The patient is not nervous/anxious and does not have insomnia.     Blood pressure 120/82, pulse 69, temperature 98.1 F (36.7 C), temperature source Oral, resp. rate 18, height 5\' 10"  (1.778 m), weight 89.359 kg (197 lb), SpO2 100 %.Body mass index is 28.27 kg/(m^2).  General Appearance: Disheveled  Eye Solicitor::  Fair  Speech:  Clear and Coherent  Volume:  Normal  Mood:  Depressed  Affect:  Appropriate  Thought Process:  Coherent  and Goal Directed  Orientation:  Full (Time, Place, and Person)  Thought Content:  WDL  Suicidal Thoughts:  No  Homicidal Thoughts:  No  Memory:  Immediate;   Fair Recent;   Fair Remote;   Fair  Judgement:  Impaired  Insight:  Shallow  Psychomotor Activity:  Normal  Concentration:  Good  Recall:  Fair  Fund of Knowledge:Fair  Language: Good  Akathisia:  No  Handed:  Right  AIMS (if indicated):     Assets:  Communication Skills Desire for Improvement Leisure Time Physical Health Resilience  ADL's:  Intact  Cognition: WNL  Sleep:         Has this patient used any form of tobacco in the last 30 days? (Cigarettes, Smokeless Tobacco, Cigars, and/or Pipes)  No  Metabolic Disorder Labs:  No results found for: HGBA1C, MPG No results found for: PROLACTIN No results found for: CHOL, TRIG, HDL, CHOLHDL, VLDL, LDLCALC  See Psychiatric Specialty Exam and Suicide Risk Assessment completed by Attending Physician prior to discharge.  Discharge destination:  Home  Is patient on multiple antipsychotic therapies at discharge:  No   Has Patient had three or more failed trials of antipsychotic monotherapy by history:  No  Recommended Plan for Multiple Antipsychotic Therapies: NA     Medication List    STOP taking these medications        amoxicillin 500 MG capsule  Commonly known as:  AMOXIL     meloxicam 7.5 MG tablet  Commonly known as:  MOBIC      TAKE these medications      Indication   pediatric multivitamin chewable tablet  Chew 1 tablet by mouth daily.   Indication:  Vitamin Supplementation       Follow-up Information    [facility] Go today.   Why:  For case management   Contact information:   [address] [place], [place] [address] [phone]      Follow-up recommendations:  As above   Comments:     Take all your medications as prescribed by your mental healthcare provider.  Report any adverse effects and or reactions from  your medicines to your outpatient provider promptly.  Patient is instructed and cautioned to not engage in alcohol and or illegal drug use while on prescription medicines.  In the event of worsening symptoms, patient is instructed to call the crisis hotline, 911 and or go to the nearest ED for appropriate evaluation and treatment of symptoms.  Follow-up with your primary care provider for your other medical issues, concerns and or health care needs.   SignedFransisca Kaufmann: Saide Lanuza, NP-C 09/12/2015, 4:16 PM

## 2015-09-15 ENCOUNTER — Emergency Department (HOSPITAL_COMMUNITY)
Admission: EM | Admit: 2015-09-15 | Discharge: 2015-09-16 | Disposition: A | Discharge status: Home / Self Care | Payer: Self-pay

## 2015-09-15 ENCOUNTER — Encounter (HOSPITAL_COMMUNITY): Payer: Self-pay | Admitting: *Deleted

## 2015-09-15 DIAGNOSIS — F259 Schizoaffective disorder, unspecified: Secondary | ICD-10-CM | POA: Diagnosis present

## 2015-09-15 DIAGNOSIS — F10230 Alcohol dependence with withdrawal, uncomplicated: Secondary | ICD-10-CM | POA: Diagnosis present

## 2015-09-15 DIAGNOSIS — R45851 Suicidal ideations: Secondary | ICD-10-CM

## 2015-09-15 DIAGNOSIS — F1024 Alcohol dependence with alcohol-induced mood disorder: Secondary | ICD-10-CM | POA: Insufficient documentation

## 2015-09-15 DIAGNOSIS — F17210 Nicotine dependence, cigarettes, uncomplicated: Secondary | ICD-10-CM | POA: Insufficient documentation

## 2015-09-15 DIAGNOSIS — F1094 Alcohol use, unspecified with alcohol-induced mood disorder: Secondary | ICD-10-CM | POA: Diagnosis present

## 2015-09-15 DIAGNOSIS — F251 Schizoaffective disorder, depressive type: Secondary | ICD-10-CM | POA: Insufficient documentation

## 2015-09-15 DIAGNOSIS — Z880 Allergy status to penicillin: Secondary | ICD-10-CM | POA: Insufficient documentation

## 2015-09-15 NOTE — ED Notes (Signed)
Pt states that he has "lots of problems"; pt has been drinking ETOH; pt states that he is here for Detox; pt states that he has thoughts of suicide and states "I think of many ways I can do it"; pt won't give a specific plan for DC; pt states "It all started with the snow and things have just been snowy and icy since"; pt laughing and smiling during triage

## 2015-09-15 NOTE — ED Notes (Signed)
No answer from waiting room.

## 2015-09-16 DIAGNOSIS — F1094 Alcohol use, unspecified with alcohol-induced mood disorder: Secondary | ICD-10-CM | POA: Diagnosis present

## 2015-09-16 DIAGNOSIS — F10230 Alcohol dependence with withdrawal, uncomplicated: Secondary | ICD-10-CM | POA: Diagnosis present

## 2015-09-16 LAB — COMPREHENSIVE METABOLIC PANEL
ALK PHOS: 72 U/L (ref 38–126)
ALT: 13 U/L — ABNORMAL LOW (ref 17–63)
ANION GAP: 15 (ref 5–15)
AST: 18 U/L (ref 15–41)
Albumin: 4 g/dL (ref 3.5–5.0)
BILIRUBIN TOTAL: 0.7 mg/dL (ref 0.3–1.2)
BUN: 20 mg/dL (ref 6–20)
CALCIUM: 8.9 mg/dL (ref 8.9–10.3)
CO2: 23 mmol/L (ref 22–32)
Chloride: 106 mmol/L (ref 101–111)
Creatinine, Ser: 0.88 mg/dL (ref 0.61–1.24)
GLUCOSE: 96 mg/dL (ref 65–99)
Potassium: 3.9 mmol/L (ref 3.5–5.1)
Sodium: 144 mmol/L (ref 135–145)
TOTAL PROTEIN: 6.3 g/dL — AB (ref 6.5–8.1)

## 2015-09-16 LAB — CBC
HEMATOCRIT: 42.3 % (ref 39.0–52.0)
Hemoglobin: 14.4 g/dL (ref 13.0–17.0)
MCH: 30.6 pg (ref 26.0–34.0)
MCHC: 34 g/dL (ref 30.0–36.0)
MCV: 89.8 fL (ref 78.0–100.0)
Platelets: 231 10*3/uL (ref 150–400)
RBC: 4.71 MIL/uL (ref 4.22–5.81)
RDW: 13.3 % (ref 11.5–15.5)
WBC: 6.9 10*3/uL (ref 4.0–10.5)

## 2015-09-16 LAB — ACETAMINOPHEN LEVEL

## 2015-09-16 LAB — ETHANOL: Alcohol, Ethyl (B): 94 mg/dL — ABNORMAL HIGH (ref <5)

## 2015-09-16 LAB — SALICYLATE LEVEL: Salicylate Lvl: <4 mg/dL (ref 2.8–30.0)

## 2015-09-16 NOTE — [facility] Suicide Risk Assessment (Signed)
Suicide Risk Assessment  Discharge Assessment   [facility]   Demographic Factors:  Male, Adolescent or young adult and Caucasian  Total Time spent with patient: 45 minutes  Musculoskeletal: Strength & Muscle Tone: within normal limits Gait & Station: normal Patient leans: N/A  Psychiatric Specialty Exam: Review of Systems  Constitutional: Negative.   HENT: Negative.   Eyes: Negative.   Respiratory: Negative.   Cardiovascular: Negative.   Gastrointestinal: Negative.   Genitourinary: Negative.   Musculoskeletal: Negative.   Skin: Negative.   Neurological: Negative.   Endo/Heme/Allergies: Negative.   Psychiatric/Behavioral: Positive for substance abuse.    Blood pressure 107/66, pulse 78, temperature 98.1 F (36.7 C), temperature source Oral, resp. rate 18, SpO2 97 %.There is no weight on file to calculate BMI.  General Appearance: Dishelved  Eye Contact::  Good  Speech:  Normal Rate  Volume:  Normal  Mood:  Euthymic  Affect:  Congruent  Thought Process:  Coherent  Orientation:  Full (Time, Place, and Person)  Thought Content:  WDL  Suicidal Thoughts:  No  Homicidal Thoughts:  No  Memory:  Immediate;   Fair Recent;   Good Remote;   Good  Judgement:  Fair  Insight:  Fair  Psychomotor Activity:  Normal  Concentration:  Good  Recall:  Good  Fund of Knowledge:Good  Language: Good  Akathisia:  No  Handed:  Right  AIMS (if indicated):     Assets:  Leisure Time Physical Health Resilience Social Support  ADL's:  Intact  Cognition: WNL  Sleep:          Has this patient used any form of tobacco in the last 30 days? (Cigarettes, Smokeless Tobacco, Cigars, and/or Pipes) Yes, A prescription for an FDA-approved tobacco cessation medication was offered at discharge and the patient refused  Mental Status Per Nursing Assessment::   On Admission:   Alcohol intoxication with suicidal ideations  Current Mental Status by Physician: NA  Loss  Factors: NA  Historical Factors: NA  Risk Reduction Factors:   Sense of responsibility to family, Employed and Positive social support  Continued Clinical Symptoms:  None  Cognitive Features That Contribute To Risk:  None    Suicide Risk:  Minimal: No identifiable suicidal ideation.  Patients presenting with no risk factors but with morbid ruminations; may be classified as minimal risk based on the severity of the depressive symptoms  Principal Problem: Alcohol-induced mood disorder [facility](HCC) Discharge Diagnoses:  Patient Active Problem List   Diagnosis Date Noted  . Alcohol dependence with uncomplicated withdrawal ([facility]CC) [F10.230] 09/16/2015    Priority: High  . Alcohol-induced mood disorder ([facility]) [F10.94] 09/16/2015    Priority: High  . Schizoaffective disorder ([facility]) [F25.9] 12/18/2014    Priority: High  . Depressive disorder [F32.9] 09/12/2015  . History of schizophrenia [Z86.59]       Plan Of Care/Follow-up recommendations:  Activity:  as tolerated Diet:  heart healthy diet  Is patient on multiple antipsychotic therapies at discharge:  No   Has Patient had three or more failed trials of antipsychotic monotherapy by history:  No  Recommended Plan for Multiple Antipsychotic Therapies: NA    Hildy Nicholl, PMH-NP 09/16/2015, 11:13 AM

## 2015-09-16 NOTE — Consult Note (Signed)
[facility] Consult   Reason for Consult:  Suicidal ideations Referring Physician:  EDP Patient Identification: Marc Hart MRN:  [id number] Principal Diagnosis: Alcohol-induced mood disorder [facility]) Diagnosis:   Patient Active Problem List   Diagnosis Date Noted  . Alcohol dependence with uncomplicated withdrawal ([place]) [F10.230] 09/16/2015    Priority: High  . Alcohol-induced mood disorder ([place]) [F10.94] 09/16/2015    Priority: High  . Schizoaffective disorder ([place]) [F25.9] 12/18/2014    Priority: High  . Depressive disorder [F32.9] 09/12/2015  . History of schizophrenia [Z86.59]     Total Time spent with patient: 45 minutes  Subjective:   Marc Hart is a 25 y.o. male patient does not warrant admission.  HPI:  25 yo male who presented to the ED under the influence of alcohol and having suicidal ideations.  Today, he has "sobered up" and is not suicidal.  He also denies suicidal/homicidal ideations, hallucinations, and withdrawal symptoms.  Denies past suicide attempts.  The patient had eaten his breakfast and many snacks.  He does not want alcohol detox or rehab at this time.  Past Psychiatric History:  Schizoaffective disorder, alcohol abuse  Risk to Self: Suicidal Ideation: No-Not Currently/Within Last 6 Months Suicidal Intent: No-Not Currently/Within Last 6 Months Is patient at risk for suicide?: No Suicidal Plan?: No Access to Means: No What has been your use of drugs/alcohol within the last 12 months?: Polysubstance abuse How many times?: 0 Other Self Harm Risks: None Triggers for Past Attempts: None known Intentional Self Injurious Behavior: None Risk to Others: Homicidal Ideation: No Thoughts of Harm to Others: No Current Homicidal Intent: No Current Homicidal Plan: No Access to Homicidal Means: No Identified Victim: No one History of harm to others?: Yes Assessment of Violence: In past 6-12 months Violent Behavior Description: Hx of  getting into fights Does patient have access to weapons?: No Criminal Charges Pending?: Yes Describe Pending Criminal Charges: Unknonw Does patient have a court date: Yes Court Date: 09/26/15 Prior Inpatient Therapy: Prior Inpatient Therapy: Yes Prior Therapy Dates: Multiple admits Prior Therapy Facilty/Provider(s): [facility] in [place] Reason for Treatment: Depression, substance abuse Prior Outpatient Therapy: Prior Outpatient Therapy: Yes Prior Therapy Dates: Unknown Prior Therapy Facilty[facility]Provider[facility]): [facility] Reason for Treatment: Depression, substance abuse Does patient have an ACCT team?: No Does patient have Intensive In-House Services?  : No Does patient have [facility] services? : No Does patient have P4[facility] services?: No  Past Medical History:  Past Medical History  Diagnosis Date  . Schizophrenia ([place])   . Substance abuse    History reviewed. No pertinent past surgical history. Family History: No family history on file. Family Psychiatric  History: None Social History:  History  Alcohol Use  . Yes    Comment: occ- last had beer 09-08-14     History  Drug Use  . Yes  . Special: Marijuana    Comment: heroin    Social History   Social History  . Marital Status: Single    Spouse Name: N/A  . Number of Children: N/A  . Years of Education: N/A   Social History Main Topics  . Smoking status: Current Every Day Smoker -- 0.75 packs/day    Types: Cigarettes  . Smokeless tobacco: None  . Alcohol Use: Yes     Comment: occ- last had beer 09-08-14  . Drug Use: Yes    Special: Marijuana     Comment: heroin  . Sexual Activity: Not Asked   Other Topics Concern  .  None   Social History Narrative   Additional Social History:    Pain Medications: Pt does not answer Prescriptions: Unknown Over the Counter: Unknown History of alcohol / drug use?: Yes Longest period of sobriety (when/how long): Pt does not know. Name of Substance 1: ETOH 1 - Age  of First Use: Teens 1 - Amount (size/oz): Pt does not know, says "I drink to get drunk." 1 - Frequency: Unknown 1 - Duration: On-going 1 - Last Use / Amount: 01/12 Name of Substance 2: Marijuana 2 - Age of First Use: Teens 2 - Amount (size/oz): Varies 2 - Frequency: Varies according to money 2 - Duration: On-going 2 - Last Use / Amount: Unknown.  No UDS on file for this episode.                 Allergies:   Allergies  Allergen Reactions  . Amoxicillin Anaphylaxis  . Ibuprofen Other (See Comments)    Messes with his chemicals  . Tylenol [Acetaminophen] Itching  . Other Rash    Allergic reaction to coppertone suntan lotion    Labs:  Results for orders placed or performed during the hospital encounter of 09/15/15 (from the past 48 hour(s))  Comprehensive metabolic panel     Status: Abnormal   Collection Time: 09/16/15  1:45 AM  Result Value Ref Range   Sodium 144 135 - 145 mmol/L   Potassium 3.9 3.5 - 5.1 mmol/L   Chloride 106 101 - 111 mmol/L   CO2 23 22 - 32 mmol/L   Glucose, Bld 96 65 - 99 mg/dL   BUN 20 6 - 20 mg/dL   Creatinine, Ser 0.88 0.61 - 1.24 mg/dL   Calcium 8.9 8.9 - 10.3 mg/dL   Total Protein 6.3 (L) 6.5 - 8.1 g/dL   Albumin 4.0 3.5 - 5.0 g/dL   AST 18 15 - 41 U/L   ALT 13 (L) 17 - 63 U/L   Alkaline Phosphatase 72 38 - 126 U/L   Total Bilirubin 0.7 0.3 - 1.2 mg/dL   GFR calc non Af Amer >60 >60 mL/min   GFR calc Af Amer >60 >60 mL/min    Comment: (NOTE) The eGFR has been calculated using the CKD EPI equation. This calculation has not been validated in all clinical situations. eGFR's persistently <60 mL/min signify possible Chronic Kidney Disease.    Anion gap 15 5 - 15  Ethanol (ETOH)     Status: Abnormal   Collection Time: 09/16/15  1:45 AM  Result Value Ref Range   Alcohol, Ethyl (B) 94 (H) <5 mg/dL    Comment:        LOWEST DETECTABLE LIMIT FOR SERUM ALCOHOL IS 5 mg/dL FOR MEDICAL PURPOSES ONLY   Salicylate level     Status: None    Collection Time: 09/16/15  1:45 AM  Result Value Ref Range   Salicylate Lvl <1.7 2.8 - 30.0 mg/dL  Acetaminophen level     Status: Abnormal   Collection Time: 09/16/15  1:45 AM  Result Value Ref Range   Acetaminophen (Tylenol), Serum <10 (L) 10 - 30 ug/mL    Comment:        THERAPEUTIC CONCENTRATIONS VARY SIGNIFICANTLY. A RANGE OF 10-30 ug/mL MAY BE AN EFFECTIVE CONCENTRATION FOR MANY PATIENTS. HOWEVER, SOME ARE BEST TREATED AT CONCENTRATIONS OUTSIDE THIS RANGE. ACETAMINOPHEN CONCENTRATIONS >150 ug/mL AT 4 HOURS AFTER INGESTION AND >50 ug/mL AT 12 HOURS AFTER INGESTION ARE OFTEN ASSOCIATED WITH TOXIC REACTIONS.   CBC  Status: None   Collection Time: 09/16/15  1:45 AM  Result Value Ref Range   WBC 6.9 4.0 - 10.5 K/uL   RBC 4.71 4.22 - 5.81 MIL/uL   Hemoglobin 14.4 13.0 - 17.0 g/dL   HCT 42.3 39.0 - 52.0 %   MCV 89.8 78.0 - 100.0 fL   MCH 30.6 26.0 - 34.0 pg   MCHC 34.0 30.0 - 36.0 g/dL   RDW 13.3 11.5 - 15.5 %   Platelets 231 150 - 400 K/uL    No current facility-administered medications for this encounter.   Current Outpatient Prescriptions  Medication Sig Dispense Refill  . Pediatric Multiple Vit-C-FA (PEDIATRIC MULTIVITAMIN) chewable tablet Chew 1 tablet by mouth daily. (Patient not taking: Reported on 09/15/2015)      Musculoskeletal: Strength & Muscle Tone: within normal limits Gait & Station: normal Patient leans: N/A  Psychiatric Specialty Exam: Review of Systems  Constitutional: Negative.   HENT: Negative.   Eyes: Negative.   Respiratory: Negative.   Cardiovascular: Negative.   Gastrointestinal: Negative.   Genitourinary: Negative.   Musculoskeletal: Negative.   Skin: Negative.   Neurological: Negative.   Endo/Heme/Allergies: Negative.   Psychiatric/Behavioral: Positive for substance abuse.    Blood pressure 107/66, pulse 78, temperature 98.1 F (36.7 C), temperature source Oral, resp. rate 18, SpO2 97 %.There is no weight on file to calculate  BMI.  General Appearance: Dishelved  Eye Contact::  Good  Speech:  Normal Rate  Volume:  Normal  Mood:  Euthymic  Affect:  Congruent  Thought Process:  Coherent  Orientation:  Full (Time, Place, and Person)  Thought Content:  WDL  Suicidal Thoughts:  No  Homicidal Thoughts:  No  Memory:  Immediate;   Fair Recent;   Good Remote;   Good  Judgement:  Fair  Insight:  Fair  Psychomotor Activity:  Normal  Concentration:  Good  Recall:  Good  Fund of Knowledge:Good  Language: Good  Akathisia:  No  Handed:  Right  AIMS (if indicated):     Assets:  Leisure Time Physical Health Resilience Social Support  ADL's:  Intact  Cognition: WNL  Sleep:      Treatment Plan Summary: Daily contact with patient to assess and evaluate symptoms and progress in treatment, Medication management and Plan Alcohol induced mood disorder:  -Crisis stabilization -Medication management:  None needed at this time, encouraged fluids and food -Individual and substance abuse counseling  Disposition: No evidence of imminent risk to self or others at present.    Waylan Boga, [place] 09/16/2015 10:54 AM Patient seen face-to-face for psychiatric evaluation, chart reviewed and case discussed with the physician extender and developed treatment plan. Reviewed the information documented and agree with the treatment plan. Corena Pilgrim, MD

## 2015-09-16 NOTE — ED Provider Notes (Signed)
CSN: [id number]     Arrival date & time 09/15/15  2219 History   First MD Initiated Contact with Patient 09/16/15 0055     Chief Complaint  Patient presents with  . Suicidal  . Alcohol Intoxication     (Consider location/radiation/quality/duration/timing/severity/associated sxs/prior Treatment) The history is provided by the patient and medical records. No language interpreter was used.   Marc Hart is a 25 y.o. male  with a hx of schizophrenia, substance abuse presents to the Emergency Department complaining that his "situation is worse every time I leave this place."  Pt reports he is "trying to go back into town."  Pt [place].  Pt reports he is trying to "get help." Pt reported to triage RN that he has thoughts of suicide but does not give a plan.  He reports to me that he is here for detox.  He reports he has been drinking EtOH.  He denies feeling suicidal but then changes his story and states that he is for me but does not answer questions about HI, AVH.    He refuses to answer any further questions for me at this time.  He has refused his blood draws multiple times.     Past Medical History  Diagnosis Date  . Schizophrenia ([facility])   . Substance abuse    History reviewed. No pertinent past surgical history. No family history on file. Social History  Substance Use Topics  . Smoking status: Current Every Day Smoker -- 0.75 packs/day    Types: Cigarettes  . Smokeless tobacco: None  . Alcohol Use: Yes     Comment: occ- last had beer 09-08-14    Review of Systems  Constitutional: Negative for fever, diaphoresis, appetite change, fatigue and unexpected weight change.  HENT: Negative for mouth sores.   Eyes: Negative for visual disturbance.  Respiratory: Negative for cough, chest tightness, shortness of breath and wheezing.   Cardiovascular: Negative for chest pain.  Gastrointestinal: Negative for nausea, vomiting, abdominal pain, diarrhea and  constipation.  Endocrine: Negative for polydipsia, polyphagia and polyuria.  Genitourinary: Negative for dysuria, urgency, frequency and hematuria.  Musculoskeletal: Negative for back pain and neck stiffness.  Skin: Negative for rash.  Allergic/Immunologic: Negative for immunocompromised state.  Neurological: Negative for syncope, light-headedness and headaches.  Hematological: Does not bruise/bleed easily.  Psychiatric/Behavioral: Positive for suicidal ideas. Negative for sleep disturbance. The patient is not nervous/anxious.       Allergies  Amoxicillin; Ibuprofen; Tylenol; and Other  Home Medications   Prior to Admission medications   Medication Sig Start Date End Date Taking? Authorizing Provider  Pediatric Multiple Vit-C-FA (PEDIATRIC MULTIVITAMIN) chewable tablet Chew 1 tablet by mouth daily. Patient not taking: Reported on 09/15/2015 09/12/15   Thermon LeylandLaura A Davis, NP   BP 111/71 mmHg  Pulse 88  Temp(Src) 98.1 F (36.7 C) (Oral)  Resp 20  SpO2 99% Physical Exam  Constitutional: He appears well-developed and well-nourished. No distress.  Awake, alert, nontoxic appearance  HENT:  Head: Normocephalic and atraumatic.  Eyes: Conjunctivae are normal. No scleral icterus.  Neck: Normal range of motion.  Cardiovascular: Intact distal pulses.   Pulmonary/Chest: Effort normal and breath sounds normal. No respiratory distress.  Equal chest expansion  Abdominal: Soft. Bowel sounds are normal. He exhibits no distension.  Musculoskeletal: Normal range of motion. He exhibits no edema.  Neurological: He is alert.  Speech is clear and goal oriented Moves extremities without ataxia  Skin: Skin is warm  and dry. He is not diaphoretic.  Psychiatric: He expresses suicidal ideation. He expresses no suicidal plans.  Nursing note and vitals reviewed. Pt refuses physical exam.  He is well appearing, smirking when he talks.    ED Course  Procedures (including critical care time) Labs  Review Labs Reviewed  COMPREHENSIVE METABOLIC PANEL - Abnormal; Notable for the following:    Total Protein 6.3 (*)    ALT 13 (*)    All other components within normal limits  ETHANOL - Abnormal; Notable for the following:    Alcohol, Ethyl (B) 94 (*)    All other components within normal limits  ACETAMINOPHEN LEVEL - Abnormal; Notable for the following:    Acetaminophen (Tylenol), Serum <10 (*)    All other components within normal limits  SALICYLATE LEVEL  CBC  URINE RAPID DRUG SCREEN, HOSP PERFORMED    Imaging Review No results found. I have personally reviewed and evaluated these images and lab results as part of my medical decision-making.   EKG Interpretation None      MDM   Final diagnoses:  Suicidal ideation   Marc Hart presents with SI.  He is generally uncooperative with both the HX and Physical.  He is well known to the department and was last d/c from [facility] on 09/11/14.    3:01 AM  Tabs reassuring. Patient has been evaluated by TTS. At this time they recommend further evaluation by psychiatry for placement determination.    BP 111/71 mmHg  Pulse 88  Temp(Src) 98.1 F (36.7 C) (Oral)  Resp 20  SpO2 99%    Dierdre Forth, PA-C 09/16/15 0302  Dione Booze, MD 09/16/15 [phone]

## 2015-09-16 NOTE — Discharge Instructions (Signed)
To help you maintain a sober lifestyle, a substance abuse treatment program may be beneficial to you.  Contact [facility] at your earliest opportunity to ask about enrolling in their program: ° °     [facility] °     [address], [address] °     [place], [place] [address] °     [phone] °     New patients are seen at the walk-in clinic every Tuesday from 9:00 am - 12:00 pm. °

## 2015-09-16 NOTE — BH Assessment (Signed)
[facility] Assessment Progress Note  Per Thedore MinsMojeed Akintayo, MD, this pt does not require psychiatric hospitalization at this time.  [facility] abuse treatment [facility].  Discharge instructions advise pt to follow up with [facility].  Pt's nurse has been notified.  Doylene Canninghomas Janson Lamar, MA Triage Specialist [phone]

## 2015-09-16 NOTE — ED Notes (Signed)
Patient will not confirm or deny if he is suicidal, homicidal or having AVH at this time. Patient given snacks and a soda. Plan of care discussed with patient. Patient voices no complaints or concerns at this time. Encouragement and support provided and safety maintain. Q 15 min safety checks in place.

## 2015-09-16 NOTE — ED Notes (Signed)
Patient refused lab work. Triage RN made aware.

## 2015-09-16 NOTE — BH Assessment (Signed)
Tele Assessment Note   Marc MartinezLandon D Hart is an 25 y.o. male.  -Clinician reviewed note by Dierdre ForthHannah Muthersbaugh, PA.  Patient came in seeking help with going to rehab and wanting to detox.  Patient had told a triage nurse that he was having thoughts of suicide and said "I think of many ways I can do it."  Pt does not give a clear suicide plan.  Patient is incongruent with stated reason for coming in.  Patient tells this clinician "I just want to go home and go to sleep."  He then talks about wanting to go to "rehab" but cannot identify other places that he has been to for rehab.  Patient says "I don't want to kill myself or anyone else."  When asked about whether he was having A/V hallucinations patient begins pretending to be hearing and seeing things and laughing.  Patient then starts to giggle and say he thinks the process is funny.  At one point patient says "I'm not going to stop drinking and I'm not going to stop drugging."  Patient is challenged on why he came [facility] then and he then reverts back to talking about getting into rehab.  [facility] and was brought to the observation unit.  He later left that day.  At that time he expressed an unwillingness to quit using drugs.  He was given outpatient resources at the time.  Patient was assessed in April of 2016.  Patient has a hx of getting into fights & being belligerent.  He is not welcome at his parent's home as he has threatened to kill father in the past and police had to take him into custody. Patient has spent [facility] in [place].  While there he was diagnosed with schizophrenia.  Parent had reported that patient has stolen from them and other relatives and has burned a lot of bridges with family.  He has a 23 year old son that he cannot care for and who's mother has abandoned.  Child is being cared for by patient's mother.    [place] has been [place] [name] [place] twice and to [facility] twice and to [facility].  Patient has a court date coming up on January 23 but the offense is unknown.  -Clinician discussed pt care with Hulan FessIjeoma Nwaeze, NP.  She said she felt that the patient needs inpatient care.  He has been in and out of the EDs frequently in the last 6 months and needs inpatient stabilization.  Patient to be referred out to other facilities since there are no beds available.    Diagnosis: Schizoaffective d/o, Polysubstance abuse;   Past Medical History:  Past Medical History  Diagnosis Date  . Schizophrenia (HCC)   . Substance abuse     History reviewed. No pertinent past surgical history.  Family History: No family history on file.  Social History:  reports that he has been smoking Cigarettes.  He has been smoking about 0.75 packs per day. He does not have any smokeless tobacco history on file. He reports that he drinks alcohol. He reports that he uses illicit drugs (Marijuana).  Additional Social History:  Alcohol / Drug Use Pain Medications: Pt does not answer Prescriptions: Unknown Over the Counter: Unknown History of alcohol / drug use?: Yes Longest period of sobriety (when/how long): Pt does not know. Substance #1 Name of Substance 1: ETOH 1 - Age of First Use: Teens 1 - Amount (  size/oz): Pt does not know, says "I drink to get drunk." 1 - Frequency: Unknown 1 - Duration: On-going 1 - Last Use / Amount: 01/12 Substance #2 Name of Substance 2: Marijuana 2 - Age of First Use: Teens 2 - Amount (size/oz): Varies 2 - Frequency: Varies according to money 2 - Duration: On-going 2 - Last Use / Amount: Unknown.  No UDS on file for this episode.  CIWA: CIWA-Ar BP: 107/66 mmHg Pulse Rate: 78 COWS:    PATIENT STRENGTHS: (choose at least two) Average or above average intelligence Communication skills  Allergies:  Allergies  Allergen Reactions  . Amoxicillin Anaphylaxis  . Ibuprofen Other (See Comments)    Messes with his chemicals  . Tylenol [Acetaminophen]  Itching  . Other Rash    Allergic reaction to coppertone suntan lotion    Home Medications:  (Not in a hospital admission)  OB/GYN Status:  No LMP for male patient.  General Assessment Data Location of Assessment: [facility] TTS Assessment: In system Is this a Tele or Face-to-Face Assessment?: Tele Assessment Is this an Initial Assessment or a Re-assessment for this encounter?: Initial Assessment Marital status: Single Is patient pregnant?: No Pregnancy Status: No Living Arrangements: Other (Comment) (Pt is homeless) Can pt return to current living arrangement?: Yes Admission Status: Voluntary Is patient capable of signing voluntary admission?: Yes Referral Source: Self/Family/Friend Insurance type: self pay     Crisis Care Plan Living Arrangements: Other (Comment) (Pt is homeless) Name of Psychiatrist: None Name of Therapist: None  Education Status Is patient currently in school?: No Highest grade of school patient has completed: GED  Risk to self with the past 6 months Suicidal Ideation: No-Not Currently/Within Last 6 Months Has patient been a risk to self within the past 6 months prior to admission? : Yes Suicidal Intent: No-Not Currently/Within Last 6 Months Has patient had any suicidal intent within the past 6 months prior to admission? : Yes Is patient at risk for suicide?: No Suicidal Plan?: No Has patient had any suicidal plan within the past 6 months prior to admission? : No Access to Means: No What has been your use of drugs/alcohol within the last 12 months?: Polysubstance abuse Previous Attempts/Gestures: No How many times?: 0 Other Self Harm Risks: None Triggers for Past Attempts: None known Intentional Self Injurious Behavior: None Family Suicide History: No Recent stressful life event(s): Other (Comment) (Homelessness) Persecutory voices/beliefs?: Yes Depression: Yes Depression Symptoms: Loss of interest in usual pleasures Substance abuse history  and/or treatment for substance abuse?: Yes Suicide prevention information given to non-admitted patients: Not applicable  Risk to Others within the past 6 months Homicidal Ideation: No Does patient have any lifetime risk of violence toward others beyond the six months prior to admission? : No Thoughts of Harm to Others: No Current Homicidal Intent: No Current Homicidal Plan: No Access to Homicidal Means: No Identified Victim: No one History of harm to others?: Yes Assessment of Violence: In past 6-12 months Violent Behavior Description: Hx of getting into fights Does patient have access to weapons?: No Criminal Charges Pending?: Yes Describe Pending Criminal Charges: Unknonw Does patient have a court date: Yes Court Date: 09/26/15 Is patient on probation?: Yes  Psychosis Hallucinations: None noted Delusions: None noted  Mental Status Report Appearance/Hygiene: Disheveled, In scrubs Eye Contact: Fair Motor Activity: Freedom of movement Speech: Argumentative, Tangential Level of Consciousness: Alert Mood: Silly, Helpless Affect: Silly Anxiety Level: None Thought Processes: Irrelevant, Flight of Ideas Judgement: Impaired Orientation: Appropriate for developmental  age Obsessive Compulsive Thoughts/Behaviors: None  Cognitive Functioning Concentration: Poor Memory: Unable to Assess IQ: Average Insight: Poor Impulse Control: Poor Appetite: Good Weight Loss: 0 Weight Gain: 0 Sleep: No Change Total Hours of Sleep: 6 Vegetative Symptoms: None  ADLScreening [facility] Assessment Services) Patient's cognitive ability adequate to safely complete daily activities?: Yes Patient able to express need for assistance with ADLs?: Yes Independently performs ADLs?: Yes (appropriate for developmental age)  Prior Inpatient Therapy Prior Inpatient Therapy: Yes Prior Therapy Dates: Multiple admits Prior Therapy Facilty/Provider(s): [facility] [place] Reason for Treatment: Depression,  substance abuse  Prior Outpatient Therapy Prior Outpatient Therapy: Yes Prior Therapy Dates: Unknown Prior Therapy Facilty/Provider(s): [facility] Reason for Treatment: Depression, substance abuse Does patient have an ACCT team?: No Does patient have Intensive In-House Services?  : No Does patient have [facility] services? : No Does patient have P4CC services?: No  ADL Screening (condition at time of admission) Patient's cognitive ability adequate to safely complete daily activities?: Yes Is the patient deaf or have difficulty hearing?: No Does the patient have difficulty seeing, even when wearing glasses/contacts?: No Does the patient have difficulty concentrating, remembering, or making decisions?: Yes Patient able to express need for assistance with ADLs?: Yes Does the patient have difficulty dressing or bathing?: No Independently performs ADLs?: Yes (appropriate for developmental age) Does the patient have difficulty walking or climbing stairs?: No Weakness of Legs: None Weakness of Arms/Hands: None       Abuse/Neglect Assessment (Assessment to be complete while patient is alone) Physical Abuse: Denies Verbal Abuse: Denies Sexual Abuse: Denies Exploitation of patient/patient's resources: Denies Self-Neglect: Denies     Merchant navy officer (For Healthcare) Does patient have an advance directive?: No Would patient like information on creating an advanced directive?: No - patient declined information    Additional Information 1:1 In Past 12 Months?: No CIRT Risk: No Elopement Risk: No Does patient have medical clearance?: Yes     Disposition:  Disposition Initial Assessment Completed for this Encounter: Yes Disposition of Patient: Other dispositions Other disposition(s): Other (Comment) (Pt needs referral to outside facilities for inpt. care)  Alexandria Lodge 09/16/2015 3:37 AM

## 2015-09-16 NOTE — ED Notes (Signed)
Pt refused blood draw for second time. RN made aware

## 2015-09-24 ENCOUNTER — Emergency Department (HOSPITAL_COMMUNITY)
Admission: EM | Admit: 2015-09-24 | Discharge: 2015-09-25 | Disposition: A | Discharge status: Home / Self Care | Payer: Self-pay | Attending: Emergency Medicine | Admitting: Emergency Medicine

## 2015-09-24 ENCOUNTER — Encounter (HOSPITAL_COMMUNITY): Payer: Self-pay | Admitting: *Deleted

## 2015-09-24 DIAGNOSIS — F17210 Nicotine dependence, cigarettes, uncomplicated: Secondary | ICD-10-CM | POA: Insufficient documentation

## 2015-09-24 DIAGNOSIS — F251 Schizoaffective disorder, depressive type: Secondary | ICD-10-CM | POA: Insufficient documentation

## 2015-09-24 DIAGNOSIS — F1094 Alcohol use, unspecified with alcohol-induced mood disorder: Secondary | ICD-10-CM | POA: Diagnosis present

## 2015-09-24 DIAGNOSIS — F1210 Cannabis abuse, uncomplicated: Secondary | ICD-10-CM | POA: Insufficient documentation

## 2015-09-24 DIAGNOSIS — Z880 Allergy status to penicillin: Secondary | ICD-10-CM | POA: Insufficient documentation

## 2015-09-24 DIAGNOSIS — F10230 Alcohol dependence with withdrawal, uncomplicated: Secondary | ICD-10-CM | POA: Diagnosis present

## 2015-09-24 DIAGNOSIS — F1994 Other psychoactive substance use, unspecified with psychoactive substance-induced mood disorder: Secondary | ICD-10-CM | POA: Diagnosis present

## 2015-09-24 DIAGNOSIS — F419 Anxiety disorder, unspecified: Secondary | ICD-10-CM | POA: Insufficient documentation

## 2015-09-24 LAB — COMPREHENSIVE METABOLIC PANEL
ALT: 14 U/L — ABNORMAL LOW (ref 17–63)
ANION GAP: 11 (ref 5–15)
AST: 21 U/L (ref 15–41)
Albumin: 4.1 g/dL (ref 3.5–5.0)
Alkaline Phosphatase: 71 U/L (ref 38–126)
BUN: 13 mg/dL (ref 6–20)
CHLORIDE: 103 mmol/L (ref 101–111)
CO2: 26 mmol/L (ref 22–32)
Calcium: 9.3 mg/dL (ref 8.9–10.3)
Creatinine, Ser: 1.02 mg/dL (ref 0.61–1.24)
Glucose, Bld: 111 mg/dL — ABNORMAL HIGH (ref 65–99)
POTASSIUM: 4.1 mmol/L (ref 3.5–5.1)
Sodium: 140 mmol/L (ref 135–145)
TOTAL PROTEIN: 6.2 g/dL — AB (ref 6.5–8.1)
Total Bilirubin: 1.1 mg/dL (ref 0.3–1.2)

## 2015-09-24 LAB — CBC
HCT: 44.2 % (ref 39.0–52.0)
Hemoglobin: 15.7 g/dL (ref 13.0–17.0)
MCH: 31.2 pg (ref 26.0–34.0)
MCHC: 35.5 g/dL (ref 30.0–36.0)
MCV: 87.9 fL (ref 78.0–100.0)
PLATELETS: 239 10*3/uL (ref 150–400)
RBC: 5.03 MIL/uL (ref 4.22–5.81)
RDW: 13.8 % (ref 11.5–15.5)
WBC: 5.9 10*3/uL (ref 4.0–10.5)

## 2015-09-24 LAB — RAPID URINE DRUG SCREEN, HOSP PERFORMED
AMPHETAMINES: NOT DETECTED
BENZODIAZEPINES: NOT DETECTED
Barbiturates: NOT DETECTED
Cocaine: NOT DETECTED
OPIATES: NOT DETECTED
Tetrahydrocannabinol: POSITIVE — AB

## 2015-09-24 LAB — SALICYLATE LEVEL

## 2015-09-24 LAB — ETHANOL

## 2015-09-24 LAB — ACETAMINOPHEN LEVEL

## 2015-09-24 NOTE — ED Provider Notes (Signed)
CSN: [id number]     Arrival date & time 09/24/15  1942 History   First MD Initiated Contact with Patient 09/24/15 2052     Chief Complaint  Patient presents with  . Paranoid     (Consider location/radiation/quality/duration/timing/severity/associated sxs/prior Treatment) The history is provided by the patient. The history is limited by the condition of the patient.  Patient with hx schizophrenia, appears mildly agitated and paranoid, states he is 'bat-shit crazy', off all meds, and that he is planning to kill self with garlic and oregano.  Pt very limited and difficult historian, appears suspicious of any additional questions asked, and will not answer - level 5 caveat.       Past Medical History  Diagnosis Date  . Schizophrenia (HCC)   . Substance abuse    History reviewed. No pertinent past surgical history. No family history on file. Social History  Substance Use Topics  . Smoking status: Current Every Day Smoker -- 0.75 packs/day    Types: Cigarettes  . Smokeless tobacco: Never Used  . Alcohol Use: Yes     Comment: occ- last had beer 09-08-14       Review of Systems  Unable to perform ROS: Psychiatric disorder  level 5 caveat    Allergies  Amoxicillin; Ibuprofen; Tylenol; and Other  Home Medications   Prior to Admission medications   Medication Sig Start Date End Date Taking? Authorizing Provider  Pediatric Multiple Vit-C-FA (PEDIATRIC MULTIVITAMIN) chewable tablet Chew 1 tablet by mouth daily. Patient not taking: Reported on 09/15/2015 09/12/15   Thermon Leyland, NP   BP 123/75 mmHg  Pulse 92  Temp(Src) 98.9 F (37.2 C) (Oral)  Resp 18  SpO2 96% Physical Exam  Constitutional: He is oriented to person, place, and time. He appears well-developed and well-nourished. No distress.  HENT:  Mouth/Throat: Oropharynx is clear and moist.  Eyes: Conjunctivae are normal. Pupils are equal, round, and reactive to light.  Neck: Neck supple. No tracheal deviation present.  No thyromegaly present.  Cardiovascular: Normal rate, regular rhythm, normal heart sounds and intact distal pulses.   No murmur heard. Pulmonary/Chest: Effort normal and breath sounds normal. No accessory muscle usage. No respiratory distress.  Abdominal: Soft. Bowel sounds are normal. He exhibits no distension. There is no tenderness.  Musculoskeletal: Normal range of motion. He exhibits no edema.  Neurological: He is alert and oriented to person, place, and time.  Steady gait  Skin: Skin is warm and dry. He is not diaphoretic.  Psychiatric:  Patient appears anxious, paranoid. +SI.  Nursing note and vitals reviewed.   ED Course  Procedures (including critical care time) Labs Review  Results for orders placed or performed during the hospital encounter of 09/24/15  Comprehensive metabolic panel  Result Value Ref Range   Sodium 140 135 - 145 mmol/L   Potassium 4.1 3.5 - 5.1 mmol/L   Chloride 103 101 - 111 mmol/L   CO2 26 22 - 32 mmol/L   Glucose, Bld 111 (H) 65 - 99 mg/dL   BUN 13 6 - 20 mg/dL   Creatinine, Ser 4.09 0.61 - 1.24 mg/dL   Calcium 9.3 8.9 - 81.1 mg/dL   Total Protein 6.2 (L) 6.5 - 8.1 g/dL   Albumin 4.1 3.5 - 5.0 g/dL   AST 21 15 - 41 U/L   ALT 14 (L) 17 - 63 U/L   Alkaline Phosphatase 71 38 - 126 U/L   Total Bilirubin 1.1 0.3 - 1.2 mg/dL   GFR calc non  Af Amer >60 >60 mL/min   GFR calc Af Amer >60 >60 mL/min   Anion gap 11 5 - 15  Ethanol (ETOH)  Result Value Ref Range   Alcohol, Ethyl (B) <5 <5 mg/dL  Salicylate level  Result Value Ref Range   Salicylate Lvl <4.0 2.8 - 30.0 mg/dL  Acetaminophen level  Result Value Ref Range   Acetaminophen (Tylenol), Serum <10 (L) 10 - 30 ug/mL  CBC  Result Value Ref Range   WBC 5.9 4.0 - 10.5 K/uL   RBC 5.03 4.22 - 5.81 MIL/uL   Hemoglobin 15.7 13.0 - 17.0 g/dL   HCT 09.8 11.9 - 14.7 %   MCV 87.9 78.0 - 100.0 fL   MCH 31.2 26.0 - 34.0 pg   MCHC 35.5 30.0 - 36.0 g/dL   RDW 82.9 56.2 - 13.0 %   Platelets 239 150 -  400 K/uL  Urine rapid drug screen (hosp performed) (Not at [facility]  Result Value Ref Range   Opiates NONE DETECTED NONE DETECTED   Cocaine NONE DETECTED NONE DETECTED   Benzodiazepines NONE DETECTED NONE DETECTED   Amphetamines NONE DETECTED NONE DETECTED   Tetrahydrocannabinol POSITIVE (A) NONE DETECTED   Barbiturates NONE DETECTED NONE DETECTED      I have personally reviewed and evaluated these lab results as part of my medical decision-making.   MDM   Reviewed nursing notes and prior charts for additional history.   Patient indicating thoughts of suicide, and then attempting to leave. IVC papers filled out, staff informed.   Beh health team consulted.  Recheck, calmer, alert.  BH eval pending - disposition per Behavioral Health team.        Cathren Laine, MD 09/24/15 2130

## 2015-09-24 NOTE — BH Assessment (Deleted)
Attempted to assess Pt but he refused to cooperate or answer questions. Pt would not say why he came to the ED or how we could help. Pt did say he would like a soda. He pretended that the tele-cart signal was breaking up and pressed the disconnect button on the tele-cart control. TTS called back and Pt pretended that he could not hear audio, even though the tech in the Pt's room confirmed the audio was fine. Then Pt said he could not hear what the tech was saying.  Explained to Dr. Cathren Laine that Pt refused to participate. Dr. Denton Lank said Pt will be observed and ED staff will contact TTS when Pt is cooperative.   Harlin Rain Patsy [place], LPC, [place], [facility] Triage Specialist [phone]

## 2015-09-24 NOTE — ED Notes (Signed)
Pt outside smoking unable to triage at this time

## 2015-09-24 NOTE — BH Assessment (Signed)
Attempted to assess Pt but he refused to cooperate or answer questions. Pt would not say why he came to the ED or how we could help. Pt did say he would like a soda. He pretended that the tele-cart signal was breaking up and pressed the disconnect button on the tele-cart control. TTS called back and Pt pretended that he could not hear audio, even though the tech in the Pt's room confirmed the audio was fine. Then Pt said he could not hear what the tech was saying. Pt mood is irritable and sullen. Explained to Dr. Cathren Laine that Pt refused to participate. Dr. Denton Lank said Pt will be observed and ED staff will contact TTS when Pt is cooperative.   Harlin Rain Patsy Baltimore, LPC, [facility], [facility] Triage Specialist [phone]

## 2015-09-24 NOTE — ED Notes (Signed)
Pt brought back to room 16 and wanded by security.

## 2015-09-24 NOTE — ED Notes (Signed)
Spoke with Dr Denton Lank regarding patient wanting to leave.  Dr Denton Lank came to Triage room 4 to speak with the patient and has made the decision to IVC patient.  [employer] officer in the waiting room notified.    This patient also told the tech that he was at dinner and heard voices.  Stated he did not listen because he did not want to hear what they were saying

## 2015-09-24 NOTE — ED Notes (Signed)
Patient stated he was not going to talk to "no machine" when the telepsych was placed in the room

## 2015-09-24 NOTE — ED Notes (Signed)
Dr. Woodrum at the bedside.  

## 2015-09-24 NOTE — ED Notes (Signed)
Refused to talk with telepsych and moved to B16  Patient needs to be wanded by security  Sitter at bedside

## 2015-09-24 NOTE — ED Notes (Signed)
Patient is paranoid and won't answer questions .  Stated all the questions I was answering were trick questions.  Patient was attempting to blow into the piece from the BP cuff

## 2015-09-24 NOTE — ED Notes (Signed)
Staffing office called for sitter.  Stated it would probably be at 11pm before we could get a Comptroller.   Patient told this nurse that he is "bat shit crazy" and has been thinking about hurting himself.  Will not go into depth regarding how he wants to hurt himself.  Only thing he states is he "usually takes Garlic and Oregano"

## 2015-09-24 NOTE — ED Notes (Signed)
Patient refusing to change into scrubs.  [employer] will assist with changing into scrubs after IVC papers have been returned.  Will keep patient in Triage room 4 to observe

## 2015-09-24 NOTE — ED Notes (Signed)
Magistrate received IVC papers and has approved them

## 2015-09-25 DIAGNOSIS — F1094 Alcohol use, unspecified with alcohol-induced mood disorder: Secondary | ICD-10-CM

## 2015-09-25 DIAGNOSIS — F1994 Other psychoactive substance use, unspecified with psychoactive substance-induced mood disorder: Secondary | ICD-10-CM | POA: Diagnosis present

## 2015-09-25 DIAGNOSIS — F10230 Alcohol dependence with withdrawal, uncomplicated: Secondary | ICD-10-CM

## 2015-09-25 MED ORDER — ALUM & MAG HYDROXIDE-SIMETH 200-200-20 MG/5ML PO SUSP: 30.0000 mL | ORAL | Status: DC | PRN

## 2015-09-25 MED ORDER — ONDANSETRON HCL 4 MG PO TABS: 4.0000 mg | ORAL_TABLET | Freq: Three times a day (TID) | ORAL | Status: DC | PRN

## 2015-09-25 MED ORDER — ACETAMINOPHEN 325 MG PO TABS
650.0000 mg | ORAL_TABLET | ORAL | Status: DC | PRN
  Filled 2015-09-25: qty 2

## 2015-09-25 NOTE — Progress Notes (Signed)
CSW was advised by RN to provide homeless resources to patient before discharge. CSW introduced self and acknowledged the patient. Patient is alert and oriented. Patient reports that prior to coming to the hospital, he was staying under a bridge in [place]. CSW attempted to provide patient with resources for homeless shelters however patient refused resources. Patient stated he has transportation from the hospital. No further CSW needs were reported at this time. CSW to sign off.   Fernande Boyden, LCSWA Clinical Social Worker [facility] Ph: [phone]

## 2015-09-25 NOTE — ED Notes (Signed)
Paged Security. Patient came out to the desk. Pt stated, "I refuse all psychiatric treatment. I want to speak with my lawyer. Do you have a phone book?" Pt reassured we do not have a phonebook, but I would be willing to look a number up for him. Pt reports, "I do not feel comfortable giving out any information." pt walked back to his room. Sitter at the bedside.

## 2015-09-25 NOTE — ED Notes (Signed)
Pt turned up television while this nurse was speaking with him. Pt asked to turn down out of respect. Pt followed commands. Pt interrupted nurse often through speech. Pt reported he would have a respectful conversation. Pt had inappropriate noises when RN left the room. Pt made aware that was inappropriate and RN would call security if needed. Pt stopped.

## 2015-09-25 NOTE — ED Notes (Signed)
MD at bedside assessing patient  

## 2015-09-25 NOTE — ED Provider Notes (Signed)
Please see previous physicians note regarding patient's presenting history and physical, initial ED course, and associated medical decision making. Patient with history of schizoaffective disorder and substance induced mood disorder. IVC placed yesterday evening for concern for danger to self. The patient was evaluated by psychiatry today, and was not felt to be a danger to himself or others. Recommended discharge home with social work resources and outpatient follow-up. IVC was subsequently rescinded. On my evaluation, he is slow to answer questions, seems removed.   does not have any suicidal or homicidal thoughts. Does not seem to exhibit delusions. Has an unremarkable physical exam and stable vital signs. Appropriate for discharge  Lavera Guise, MD 09/25/15 1745

## 2015-09-25 NOTE — ED Notes (Signed)
Pt refused resources from Care Management.

## 2015-09-25 NOTE — ED Notes (Signed)
Pt made aware of resources. Pt refused at this time. Pt given belongings :shoes, jacket, shorts back at this time.

## 2015-09-25 NOTE — Discharge Instructions (Signed)
Return if having thoughts of wanting to hurt yourself or others or if you feel unsafe.  Schizoaffective Disorder Schizoaffective disorder (ScAD) is a mental illness. It causes symptoms that are a mixture of schizophrenia (a psychotic disorder) and an affective (mood) disorder. The schizophrenic symptoms may include delusions, hallucinations, or odd behavior. The mood symptoms may be similar to major depression or bipolar disorder. ScAD may interfere with personal relationships or normal daily activities. People with ScAD are at increased risk for job loss, social isolation,physical health problems, anxiety and substance use disorders, and suicide. ScAD usually occurs in cycles. Periods of severe symptoms are followed by periods of less severe symptoms or improvement. The illness affects men and women equally but usually appears at an earlier age (teenage or early adult years) in men. People who have family members with schizophrenia, bipolar disorder, or ScAD are at higher risk of developing ScAD. SYMPTOMS  At any one time, people with ScAD may have psychotic symptoms only or both psychotic and mood symptoms. The psychotic symptoms include one or more of the following:  Hearing, seeing, or feeling things that are not there (hallucinations).   Having fixed, false beliefs (delusions). The delusions usually are of being attacked, harassed, cheated, persecuted, or conspired against (paranoid delusions).  Speaking in a way that makes no sense to others (disorganized speech). The psychotic symptoms of ScAD may also include confusing or odd behavior or any of the negative symptoms of schizophrenia. These include loss of motivation for normal daily activities, such as bathing or grooming, withdrawal from other people, and lack of emotions.  The mood symptoms of ScAD occur more often than not. They resemble major depressive disorder or bipolar mania. Symptoms of major depression include depressed mood and  four or more of the following:  Loss of interest in usually pleasurable activities (anhedonia).  Sleeping more or less than normal.  Feeling worthless or excessively guilty.  Lack of energy or motivation.  Trouble concentrating.  Eating more or less than usual.  Thinking a lot about death or suicide. Symptoms of bipolar mania include abnormally elevated or irritable mood and increased energy or activity, plus three or more of the following:   More confidence than normal or feeling that you are able to do anything (grandiosity).  Feeling rested with less sleep than normal.   Being easily distracted.   Talking more than usual or feeling pressured to keep talking.   Feeling that your thoughts are racing.  Engaging in high-risk activities such as buying sprees or foolish business decisions. DIAGNOSIS  ScAD is diagnosed through an assessment by your health care provider. Your health care provider will observe and ask questions about your thoughts, behavior, mood, and ability to function in daily life. Your health care provider may also ask questions about your medical history and use of drugs, including prescription medicines. Your health care provider may also order blood tests and imaging exams. Certain medical conditions and substances can cause symptoms that resemble ScAD. Your health care provider may refer you to a mental health specialist for evaluation.  ScAD is divided into two types. The depressive type is diagnosed if your mood symptoms are limited to major depression. The bipolar type is diagnosed if your mood symptoms are manic or a mixture of manic and depressive symptoms TREATMENT  ScAD is usually a lifelong illness. Long-term treatment is necessary. The following treatments are available:  Medicine. Different types of medicine are used to treat ScAD. The exact combination depends on the  type and severity of your symptoms. Antipsychotic medicine is used to control  psychotic symptomssuch as delusions, paranoia, and hallucinations. Mood stabilizers can even the highs and lows of bipolar manic mood swings. Antidepressant medicines are used to treat major depressive symptoms.  Counseling or talk therapy. Individual, group, or family counseling may be helpful in providing education, support, and guidance. Many people with ScAD also benefit from social skills and job skills (vocational) training. A combination of medicine and counseling is usually best for managing the disorder over time. A procedure in which electricity is applied to the brain through the scalp (electroconvulsive therapy) may be used to treat people with severe manic symptoms that do not respond to medicine and counseling. HOME CARE INSTRUCTIONS   Take all your medicine as prescribed.  Check with your health care provider before starting new prescription or over-the-counter medicines.  Keep all follow up appointments with your health care provider. SEEK MEDICAL CARE IF:   If you are not able to take your medicines as prescribed.  If your symptoms get worse. SEEK IMMEDIATE MEDICAL CARE IF:   You have serious thoughts about hurting yourself or others.   This information is not intended to replace advice given to you by your health care provider. Make sure you discuss any questions you have with your health care provider.   Document Released: 12/31/2006 Document Revised: 09/10/2014 Document Reviewed: 04/03/2013 Elsevier Interactive Patient Education [employer]

## 2015-09-25 NOTE — ED Notes (Signed)
NP Psychiatry at the bedside  

## 2015-09-25 NOTE — Consult Note (Signed)
[facility] Face-to-Face Psychiatry Consult   Reason for Consult:  Uncooperative with staff, psych hx of ETOH abuse Referring Physician:  EDP Patient Identification: KARLIS CREGG MRN:  [id number] Principal Diagnosis: Substance induced mood disorder ([place]) Diagnosis:   Patient Active Problem List   Diagnosis Date Noted  . Substance induced mood disorder ([place]) [F19.94] 09/25/2015  . Alcohol dependence with uncomplicated withdrawal ([place]) [F10.230] 09/16/2015  . Alcohol-induced mood disorder ([place]) [F10.94] 09/16/2015  . Depressive disorder [F32.9] 09/12/2015  . History of schizophrenia [Z86.59]   . Schizoaffective disorder ([place]) [F25.9] 12/18/2014    Total Time spent with patient: 45 minutes  Subjective:   Breckon Reeves Jocson is a 25 y.o. male patient does not warrant admission. Pt seen and chart reviewed. He initially refused to speak to me. Then he was willing to discuss treatment plan. Pt is alert/oriented x4, calm, cooperative, and appropriate to situation. Pt denies suicidal/homicidal ideation and psychosis and does not appear to be responding to internal stimuli. Pt reports that his primary concern is being kicked out of his shelter and that he would like to get a new placement for a place to stay if possible. Pt also wants resources for alcohol. He asked briefly if he could come to [facility], then reported that he would be happy with any place that gave him a bed to stay in.   HPI:  25 yo male who presented to the ED and would not answer questions about why he is here. Social work attempted to assess Pt but he refused to cooperate or answer questions. Pt would not say why he came to the ED or how we could help. Pt did say he would like a soda. He pretended that the tele-cart signal was breaking up and pressed the disconnect button on the tele-cart control. TTS called back and Pt pretended that he could not hear audio, even though the tech in the Pt's room confirmed the audio was fine. Then Pt said he  could not hear what the tech was saying. Pt mood is irritable and sullen. Explained to Dr. Lajean Saver that Pt refused to participate. Dr. Ashok Cordia said Pt will be observed and ED staff will contact TTS when Pt is cooperative.  Pt has been seen and sent home from [facility] on 09/10/15 with substance abuse for ETOH/THC. No psych meds given, no other issues at that time. Seen in ED on 09/16/15, sent home without meds, substance abuse was the only diagnosis.   Past Psychiatric History:  Schizoaffective disorder, alcohol abuse  Risk to Self: Is patient at risk for suicide?: Yes Risk to Others:   Prior Inpatient Therapy:   Prior Outpatient Therapy:    Past Medical History:  Past Medical History  Diagnosis Date  . Schizophrenia ([place])   . Substance abuse    History reviewed. No pertinent past surgical history. Family History: No family history on file. Family Psychiatric  History: None Social History:  History  Alcohol Use  . Yes    Comment: occ- last had beer 09-08-14     History  Drug Use  . Yes  . Special: Marijuana    Comment: heroin    Social History   Social History  . Marital Status: Single    Spouse Name: N/A  . Number of Children: N/A  . Years of Education: N/A   Social History Main Topics  . Smoking status: Current Every Day Smoker -- 0.75 packs/day    Types: Cigarettes  . Smokeless tobacco: Never Used  .  Alcohol Use: Yes     Comment: occ- last had beer 09-08-14  . Drug Use: Yes    Special: Marijuana     Comment: heroin  . Sexual Activity: Not Asked   Other Topics Concern  . None   Social History Narrative   Additional Social History:                          Allergies:   Allergies  Allergen Reactions  . Amoxicillin Anaphylaxis    Has patient had a PCN reaction causing immediate rash, facial/tongue/throat swelling, SOB or lightheadedness with hypotension: Yes Has patient had a PCN reaction causing severe rash involving mucus membranes or skin  necrosis: No Has patient had a PCN reaction that required hospitalization No Has patient had a PCN reaction occurring within the last 10 years: Yes If all of the above answers are "NO", then may proceed with Cephalosporin use.  . Ibuprofen Other (See Comments)    Messes with his chemicals  . Tylenol [Acetaminophen] Itching  . Other Rash    Allergic reaction to coppertone suntan lotion    Labs:  Results for orders placed or performed during the hospital encounter of 09/24/15 (from the past 48 hour(s))  Urine rapid drug screen (hosp performed) (Not at [facility])     Status: Abnormal   Collection Time: 09/24/15  8:30 PM  Result Value Ref Range   Opiates NONE DETECTED NONE DETECTED   Cocaine NONE DETECTED NONE DETECTED   Benzodiazepines NONE DETECTED NONE DETECTED   Amphetamines NONE DETECTED NONE DETECTED   Tetrahydrocannabinol POSITIVE (A) NONE DETECTED   Barbiturates NONE DETECTED NONE DETECTED    Comment:        DRUG SCREEN FOR MEDICAL PURPOSES ONLY.  IF CONFIRMATION IS NEEDED FOR ANY PURPOSE, NOTIFY LAB WITHIN 5 DAYS.        LOWEST DETECTABLE LIMITS FOR URINE DRUG SCREEN Drug Class       Cutoff (ng/mL) Amphetamine      1000 Barbiturate      200 Benzodiazepine   528 Tricyclics       413 Opiates          300 Cocaine          300 THC              50   Comprehensive metabolic panel     Status: Abnormal   Collection Time: 09/24/15  8:43 PM  Result Value Ref Range   Sodium 140 135 - 145 mmol/L   Potassium 4.1 3.5 - 5.1 mmol/L   Chloride 103 101 - 111 mmol/L   CO2 26 22 - 32 mmol/L   Glucose, Bld 111 (H) 65 - 99 mg/dL   BUN 13 6 - 20 mg/dL   Creatinine, Ser 1.02 0.61 - 1.24 mg/dL   Calcium 9.3 8.9 - 10.3 mg/dL   Total Protein 6.2 (L) 6.5 - 8.1 g/dL   Albumin 4.1 3.5 - 5.0 g/dL   AST 21 15 - 41 U/L   ALT 14 (L) 17 - 63 U/L   Alkaline Phosphatase 71 38 - 126 U/L   Total Bilirubin 1.1 0.3 - 1.2 mg/dL   GFR calc non Af Amer >60 >60 mL/min   GFR calc Af Amer >60 >60 mL/min     Comment: (NOTE) The eGFR has been calculated using the CKD EPI equation. This calculation has not been validated in all clinical situations. eGFR's persistently <60 mL/min signify possible Chronic  Kidney Disease.    Anion gap 11 5 - 15  Ethanol (ETOH)     Status: None   Collection Time: 09/24/15  8:43 PM  Result Value Ref Range   Alcohol, Ethyl (B) <5 <5 mg/dL    Comment:        LOWEST DETECTABLE LIMIT FOR SERUM ALCOHOL IS 5 mg/dL FOR MEDICAL PURPOSES ONLY   Salicylate level     Status: None   Collection Time: 09/24/15  8:43 PM  Result Value Ref Range   Salicylate Lvl <3.3 2.8 - 30.0 mg/dL  Acetaminophen level     Status: Abnormal   Collection Time: 09/24/15  8:43 PM  Result Value Ref Range   Acetaminophen (Tylenol), Serum <10 (L) 10 - 30 ug/mL    Comment:        THERAPEUTIC CONCENTRATIONS VARY SIGNIFICANTLY. A RANGE OF 10-30 ug/mL MAY BE AN EFFECTIVE CONCENTRATION FOR MANY PATIENTS. HOWEVER, SOME ARE BEST TREATED AT CONCENTRATIONS OUTSIDE THIS RANGE. ACETAMINOPHEN CONCENTRATIONS >150 ug/mL AT 4 HOURS AFTER INGESTION AND >50 ug/mL AT 12 HOURS AFTER INGESTION ARE OFTEN ASSOCIATED WITH TOXIC REACTIONS.   CBC     Status: None   Collection Time: 09/24/15  8:43 PM  Result Value Ref Range   WBC 5.9 4.0 - 10.5 K/uL   RBC 5.03 4.22 - 5.81 MIL/uL   Hemoglobin 15.7 13.0 - 17.0 g/dL   HCT 44.2 39.0 - 52.0 %   MCV 87.9 78.0 - 100.0 fL   MCH 31.2 26.0 - 34.0 pg   MCHC 35.5 30.0 - 36.0 g/dL   RDW 13.8 11.5 - 15.5 %   Platelets 239 150 - 400 K/uL    Current Facility-Administered Medications  Medication Dose Route Frequency Provider Last Rate Last Dose  . acetaminophen (TYLENOL) tablet 650 mg  650 mg Oral Q4H PRN Sharlett Iles, MD   650 mg at 09/25/15 0823  . alum & mag hydroxide-simeth (MAALOX/MYLANTA) 200-200-20 MG/5ML suspension 30 mL  30 mL Oral PRN Sharlett Iles, MD      . ondansetron [facility]) tablet 4 mg  4 mg Oral Q8H PRN Sharlett Iles, MD        Current Outpatient Prescriptions  Medication Sig Dispense Refill  . Pediatric Multiple Vit-C-FA (PEDIATRIC MULTIVITAMIN) chewable tablet Chew 1 tablet by mouth daily. (Patient not taking: Reported on 09/15/2015)      Musculoskeletal: Strength & Muscle Tone: within normal limits Gait & Station: normal Patient leans: N/A  Psychiatric Specialty Exam: Review of Systems  Constitutional: Negative.   HENT: Negative.   Eyes: Negative.   Respiratory: Negative.   Cardiovascular: Negative.   Gastrointestinal: Negative.   Genitourinary: Negative.   Musculoskeletal: Negative.   Skin: Negative.   Neurological: Negative.   Endo/Heme/Allergies: Negative.   Psychiatric/Behavioral: Positive for substance abuse.  All other systems reviewed and are negative.   Blood pressure 113/63, pulse 59, temperature 97.9 F (36.6 C), temperature source Oral, resp. rate 16, SpO2 99 %.There is no weight on file to calculate BMI.  General Appearance: Dishelved  Eye Contact::  Good  Speech:  Normal Rate  Volume:  Normal  Mood:  Euthymic  Affect:  Congruent  Thought Process:  Coherent  Orientation:  Full (Time, Place, and Person)  Thought Content:  WDL  Suicidal Thoughts:  No  Homicidal Thoughts:  No  Memory:  Immediate;   Fair Recent;   Good Remote;   Good  Judgement:  Fair  Insight:  Fair  Psychomotor Activity:  Normal  Concentration:  Good  Recall:  Good  Fund of Knowledge:Good  Language: Good  Akathisia:  No  Handed:  Right  AIMS (if indicated):     Assets:  Leisure Time Physical Health Resilience Social Support  ADL's:  Intact  Cognition: WNL  Sleep:      Treatment Plan Summary: -Crisis stabilization -Individual and substance abuse counseling resources   Disposition: No evidence of imminent risk to self or others at present.   -Discharge home with resources for depression/alcohol abuse -[facility] SW to help coordinate this plus shelter resources.   Benjamine Mola, FNP-BC 09/25/2015  11:16 AM

## 2015-09-25 NOTE — ED Notes (Addendum)
Called Social Work and Case Management to help discharge informatiom. No one has picked up. No one answering. Called [facility] ED SW no answer

## 2015-09-25 NOTE — ED Notes (Signed)
[facility] at the bedside asking patient to stay calm while we work to get him out of here. Pt restless. Made aware of the plan of care.

## 2015-09-25 NOTE — ED Notes (Signed)
Patient overheard vomiting in the bathroom. Pt asked if he wanted something for nausea. Refused and stated, "I just wanted to be left alone."

## 2015-10-06 ENCOUNTER — Encounter (HOSPITAL_COMMUNITY): Payer: Self-pay | Admitting: *Deleted

## 2015-10-06 ENCOUNTER — Emergency Department (HOSPITAL_COMMUNITY)
Admission: EM | Admit: 2015-10-06 | Discharge: 2015-10-07 | Disposition: A | Discharge status: Home / Self Care | Payer: Self-pay | Attending: Emergency Medicine | Admitting: Emergency Medicine

## 2015-10-06 DIAGNOSIS — F17210 Nicotine dependence, cigarettes, uncomplicated: Secondary | ICD-10-CM | POA: Insufficient documentation

## 2015-10-06 DIAGNOSIS — F329 Major depressive disorder, single episode, unspecified: Secondary | ICD-10-CM | POA: Insufficient documentation

## 2015-10-06 DIAGNOSIS — F32A Depression, unspecified: Secondary | ICD-10-CM

## 2015-10-06 DIAGNOSIS — F1210 Cannabis abuse, uncomplicated: Secondary | ICD-10-CM | POA: Insufficient documentation

## 2015-10-06 DIAGNOSIS — F1994 Other psychoactive substance use, unspecified with psychoactive substance-induced mood disorder: Secondary | ICD-10-CM | POA: Diagnosis present

## 2015-10-06 DIAGNOSIS — F1410 Cocaine abuse, uncomplicated: Secondary | ICD-10-CM | POA: Insufficient documentation

## 2015-10-06 DIAGNOSIS — Z880 Allergy status to penicillin: Secondary | ICD-10-CM | POA: Insufficient documentation

## 2015-10-06 LAB — CBC
HCT: 44.2 % (ref 39.0–52.0)
Hemoglobin: 15.7 g/dL (ref 13.0–17.0)
MCH: 31.2 pg (ref 26.0–34.0)
MCHC: 35.5 g/dL (ref 30.0–36.0)
MCV: 87.9 fL (ref 78.0–100.0)
PLATELETS: 206 10*3/uL (ref 150–400)
RBC: 5.03 MIL/uL (ref 4.22–5.81)
RDW: 13.2 % (ref 11.5–15.5)
WBC: 7.1 10*3/uL (ref 4.0–10.5)

## 2015-10-06 LAB — COMPREHENSIVE METABOLIC PANEL
ALT: 13 U/L — ABNORMAL LOW (ref 17–63)
ANION GAP: 10 (ref 5–15)
AST: 19 U/L (ref 15–41)
Albumin: 4 g/dL (ref 3.5–5.0)
Alkaline Phosphatase: 76 U/L (ref 38–126)
BILIRUBIN TOTAL: 0.9 mg/dL (ref 0.3–1.2)
BUN: 18 mg/dL (ref 6–20)
CHLORIDE: 101 mmol/L (ref 101–111)
CO2: 25 mmol/L (ref 22–32)
Calcium: 8.8 mg/dL — ABNORMAL LOW (ref 8.9–10.3)
Creatinine, Ser: 1.18 mg/dL (ref 0.61–1.24)
Glucose, Bld: 96 mg/dL (ref 65–99)
POTASSIUM: 3.7 mmol/L (ref 3.5–5.1)
Sodium: 136 mmol/L (ref 135–145)
TOTAL PROTEIN: 6.1 g/dL — AB (ref 6.5–8.1)

## 2015-10-06 LAB — ACETAMINOPHEN LEVEL

## 2015-10-06 LAB — RAPID URINE DRUG SCREEN, HOSP PERFORMED
Amphetamines: NOT DETECTED
BENZODIAZEPINES: NOT DETECTED
Barbiturates: NOT DETECTED
Cocaine: POSITIVE — AB
OPIATES: NOT DETECTED
Tetrahydrocannabinol: POSITIVE — AB

## 2015-10-06 LAB — SALICYLATE LEVEL: Salicylate Lvl: <4 mg/dL (ref 2.8–30.0)

## 2015-10-06 LAB — ETHANOL

## 2015-10-06 MED ORDER — ONDANSETRON HCL 4 MG PO TABS
4.0000 mg | ORAL_TABLET | Freq: Three times a day (TID) | ORAL | Status: DC | PRN
Start: 2015-10-06 — End: 2015-10-07

## 2015-10-06 MED ORDER — LORAZEPAM 1 MG PO TABS
1.0000 mg | ORAL_TABLET | ORAL | Status: DC | PRN
  Administered 2015-10-07: 1 mg via ORAL
  Filled 2015-10-06: qty 1

## 2015-10-06 NOTE — ED Notes (Signed)
Staffing called for sitter.   

## 2015-10-06 NOTE — ED Notes (Addendum)
Pt admits to feeling depressed for a couple of days - admits to stressors in his life however does not disclose specific information. During further evaluation while in triage pt does admit to thoughts of harming himself.

## 2015-10-06 NOTE — ED Provider Notes (Signed)
CSN: [id number]     Arrival date & time 10/06/15  2136 History   First MD Initiated Contact with Patient 10/06/15 2222     Chief Complaint  Patient presents with  . Depression  . Suicidal     Patient is a 25 y.o. male presenting with depression. The history is provided by the patient.  Depression This is a recurrent problem. The current episode started more than 1 week ago. The problem occurs constantly. The problem has been gradually worsening. Pertinent negatives include no chest pain, no abdominal pain and no headaches. Nothing aggravates the symptoms. Nothing relieves the symptoms.  pt reports feeling depressed and now has suicidal ideation He reports he might harm himself He does not endorse any hallucinations   Past Medical History  Diagnosis Date  . Schizophrenia ([facility])   . Substance abuse    History reviewed. No pertinent past surgical history. History reviewed. No pertinent family history. Social History  Substance Use Topics  . Smoking status: Current Every Day Smoker -- 0.75 packs/day    Types: Cigarettes  . Smokeless tobacco: Never Used  . Alcohol Use: Yes     Comment: occ- last had beer 09-08-14    Review of Systems  Cardiovascular: Negative for chest pain.  Gastrointestinal: Negative for abdominal pain.  Neurological: Negative for headaches.  Psychiatric/Behavioral: Positive for depression and suicidal ideas.  All other systems reviewed and are negative.     Allergies  Amoxicillin; Ibuprofen; Tylenol; and Other  Home Medications   Prior to Admission medications   Medication Sig Start Date End Date Taking? Authorizing Provider  Pediatric Multiple Vit-C-FA (PEDIATRIC MULTIVITAMIN) chewable tablet Chew 1 tablet by mouth daily. Patient not taking: Reported on 09/15/2015 09/12/15   Thermon Leyland, NP   BP 120/77 mmHg  Pulse 64  Temp(Src) 97.7 F (36.5 C)  Resp 18  Ht  (1.727 m)  Wt 82.753 kg  BMI 27.75 kg/m2  SpO2 98% Physical Exam CONSTITUTIONAL:  Disheveled HEAD: Normocephalic/atraumatic EYES: EOMI ENMT: Mucous membranes moist NECK: supple no meningeal signs CV: S1/S2 noted, no murmurs/rubs/gallops noted LUNGS: Lungs are clear to auscultation bilaterally, no apparent distress ABDOMEN: soft, nontender NEURO: Pt is awake/alert/appropriate, moves all extremitiesx4.  No facial droop.   EXTREMITIES: pulses normal/equal, full ROM SKIN: warm, color normal PSYCH: poor eye contact, appears mildly anxious  ED Course  Procedures  11:18 PM Pt is medically stable He will need psych consult He agrees to stay voluntarily As of now, there are no home meds in EPIC  Labs Review Labs Reviewed  COMPREHENSIVE METABOLIC PANEL - Abnormal; Notable for the following:    Calcium 8.8 (*)    Total Protein 6.1 (*)    ALT 13 (*)    All other components within normal limits  ACETAMINOPHEN LEVEL - Abnormal; Notable for the following:    Acetaminophen (Tylenol), Serum <10 (*)    All other components within normal limits  URINE RAPID DRUG SCREEN, HOSP PERFORMED - Abnormal; Notable for the following:    Cocaine POSITIVE (*)    Tetrahydrocannabinol POSITIVE (*)    All other components within normal limits  ETHANOL  SALICYLATE LEVEL  CBC    I have personally reviewed and evaluated these  lab results as part of my medical decision-making.   MDM   Final diagnoses:  None    Nursing notes including past medical history and social history reviewed and considered in documentation Labs/vital reviewed myself and considered during evaluation     Dorinda Hill  Bebe Shaggy, MD 10/06/15 2318

## 2015-10-07 ENCOUNTER — Encounter (HOSPITAL_COMMUNITY): Payer: Self-pay | Admitting: Registered Nurse

## 2015-10-07 DIAGNOSIS — F329 Major depressive disorder, single episode, unspecified: Secondary | ICD-10-CM

## 2015-10-07 DIAGNOSIS — F1994 Other psychoactive substance use, unspecified with psychoactive substance-induced mood disorder: Secondary | ICD-10-CM

## 2015-10-07 DIAGNOSIS — F32A Depression, unspecified: Secondary | ICD-10-CM | POA: Insufficient documentation

## 2015-10-07 DIAGNOSIS — F1910 Other psychoactive substance abuse, uncomplicated: Secondary | ICD-10-CM

## 2015-10-07 NOTE — Progress Notes (Signed)
Spoke with Algernon Huxley at [facility] to discuss pt's hx of care in order to determine if pt appropriate for care coordination.  Algernon Huxley accessed pt's record showing pt received care coordination services from December 2015 to 09/27/15, when he was d/c due to having attended last 3 scheduled appointments with [facility]) to receive his injectable. States record shows he was last seen for injection 08/2015. States pt also had a hospital transition team in summer 2016 following an inpatient admission at [facility]. States pt has hx of schizophrenia and opioid use d/o. States records from care coordinator indicate pt is fairly transient re: housing and homelessness is a stressor and barrier to consistency in care.   Pt has reported to NP (see note) that he plans to go directly to [facility] upon d/c. Algernon Huxley advises, should pt present to ED again in near future, contact [facility] to initiate care coordination referral to investigate if there has been a lapse in care. Otherwise, although pt has presented multiple times to ED in the past month, records indicate he is engaged and accessing his outpatient care.   Ilean Skill, MSW, LCSW Clinical Social Work, Disposition  10/07/2015 [phone]

## 2015-10-07 NOTE — ED Notes (Signed)
Pt hit call button to end his TTY interview, according to his sitter.  This EMT reported to the room, asked pt what he needed, and the pt made very rude comments and ordered this EMT to shut up and leave. The pt continued his interview.  This EMT informed the pt's RN.

## 2015-10-07 NOTE — Consult Note (Signed)
Telepsych Consultation   Reason for Consult:  Depression and suicidal ideation Referring Physician:  EDP Patient Identification: Marc Hart MRN:  [id number] Principal Diagnosis: Substance induced mood disorder ([place]) Diagnosis:   Patient Active Problem List   Diagnosis Date Noted  . Substance induced mood disorder ([place]) [F19.94] 09/25/2015  . Alcohol dependence with uncomplicated withdrawal ([place]) [F10.230] 09/16/2015  . Alcohol-induced mood disorder ([place]) [F10.94] 09/16/2015  . Depressive disorder [F32.9] 09/12/2015  . History of schizophrenia [Z86.59]   . Schizoaffective disorder ([place]) [F25.9] 12/18/2014    Total Time spent with patient: 30 minutes  Subjective:   Marc Hart is a 25 y.o. male patient presented to [facility] ED with complaints of stressor and suicidal ideation without a plan.  Stressor being he was kicked out of the shelter and needs a place to stay.  Patient was seen face to face by psych and recommended that patient stay over night for re assessment today.    HPI:  Marc Hart is a 25 y.o. male patient in [facility] ED.  Patient was assessed yesterday with complaints of depression, suicidal ideation without a plan.  Depression and suicidal thoughts brought on by being kicked out of shelter.  During this assessment patient is alert/oriented x4, sitting up in bead eating.  Patient is irritable, agitated, and argumentative stating that "I done already talked to one doctor.  This ain't no damn 20 questions."  Patient denies suicidal ideation, homicidal ideation, psychosis, and paranoia; states that he is going to [facility] today as soon as he can get released from hospital.  Patient states that he is not currently taking any psychotropic medications.     Past Psychiatric History: Substance Induced Mood Disorder; history of [facility] OBS on 09/10/15 with substance abuse for ETOH/THC. No psychotropic medications  Risk to Self: Suicidal Ideation: No-Not Currently/Within Last 6  Months Suicidal Intent: No Is patient at risk for suicide?: No Suicidal Plan?: Yes-Currently Present Specify Current Suicidal Plan: Jump from a bridge Access to Means: Yes Specify Access to Suicidal Means: Traffic What has been your use of drugs/alcohol within the last 12 months?: Cocaine & THC How many times?: 0 Other Self Harm Risks: None Triggers for Past Attempts: None known Intentional Self Injurious Behavior: None Risk to Others: Homicidal Ideation: No Thoughts of Harm to Others: No Current Homicidal Intent: No Current Homicidal Plan: No Access to Homicidal Means: No Identified Victim: No one History of harm to others?: Yes Assessment of Violence: In past 6-12 months Violent Behavior Description: Hx of getting into fights. Does patient have access to weapons?: No Criminal Charges Pending?: Yes Describe Pending Criminal Charges: Unknown Does patient have a court date: Yes Court Date:  (Pt does not know) Prior Inpatient Therapy: Prior Inpatient Therapy: Yes Prior Therapy Dates: 01.17Multiple admits Prior Therapy Facilty/Provider(s): [facility] in [place] Reason for Treatment: Depression, substance abuse Prior Outpatient Therapy: Prior Outpatient Therapy: No Prior Therapy Dates: None Prior Therapy Facilty/Provider(s): None Reason for Treatment: None Does patient have an ACCT team?: No Does patient have Intensive In-House Services?  : No Does patient have Monarch services? : No Does patient have P4CC services?: No  Past Medical History:  Past Medical History  Diagnosis Date  . Schizophrenia ([place])   . Substance abuse    History reviewed. No pertinent past surgical history. Family History: History reviewed. No pertinent family history. Family Psychiatric  History: None reported Social History:  History  Alcohol Use  . Yes    Comment: occ- last had  beer 09-08-14     History  Drug Use  . Yes  . Special: Marijuana    Comment: heroin    Social History    Social History  . Marital Status: Single    Spouse Name: N/A  . Number of Children: N/A  . Years of Education: N/A   Social History Main Topics  . Smoking status: Current Every Day Smoker -- 0.75 packs/day    Types: Cigarettes  . Smokeless tobacco: Never Used  . Alcohol Use: Yes     Comment: occ- last had beer 09-08-14  . Drug Use: Yes    Special: Marijuana     Comment: heroin  . Sexual Activity: Not Asked   Other Topics Concern  . None   Social History Narrative   Additional Social History:    Allergies:   Allergies  Allergen Reactions  . Amoxicillin Anaphylaxis    Has patient had a PCN reaction causing immediate rash, facial/tongue/throat swelling, SOB or lightheadedness with hypotension: Yes Has patient had a PCN reaction causing severe rash involving mucus membranes or skin necrosis: No Has patient had a PCN reaction that required hospitalization No Has patient had a PCN reaction occurring within the last 10 years: Yes If all of the above answers are "NO", then may proceed with Cephalosporin use.  . Ibuprofen Other (See Comments)    Messes with his chemicals  . Tylenol [Acetaminophen] Itching  . Other Rash    Allergic reaction to coppertone suntan lotion    Labs:  Results for orders placed or performed during the hospital encounter of 10/06/15 (from the past 48 hour(s))  Comprehensive metabolic panel     Status: Abnormal   Collection Time: 10/06/15 10:05 PM  Result Value Ref Range   Sodium 136 135 - 145 mmol/L   Potassium 3.7 3.5 - 5.1 mmol/L   Chloride 101 101 - 111 mmol/L   CO2 25 22 - 32 mmol/L   Glucose, Bld 96 65 - 99 mg/dL   BUN 18 6 - 20 mg/dL   Creatinine, Ser 1.18 0.61 - 1.24 mg/dL   Calcium 8.8 (L) 8.9 - 10.3 mg/dL   Total Protein 6.1 (L) 6.5 - 8.1 g/dL   Albumin 4.0 3.5 - 5.0 g/dL   AST 19 15 - 41 U/L   ALT 13 (L) 17 - 63 U/L   Alkaline Phosphatase 76 38 - 126 U/L   Total Bilirubin 0.9 0.3 - 1.2 mg/dL   GFR calc non Af Amer >60 >60 mL/min    GFR calc Af Amer >60 >60 mL/min    Comment: (NOTE) The eGFR has been calculated using the CKD EPI equation. This calculation has not been validated in all clinical situations. eGFR's persistently <60 mL/min signify possible Chronic Kidney Disease.    Anion gap 10 5 - 15  Ethanol (ETOH)     Status: None   Collection Time: 10/06/15 10:05 PM  Result Value Ref Range   Alcohol, Ethyl (B) <5 <5 mg/dL    Comment:        LOWEST DETECTABLE LIMIT FOR SERUM ALCOHOL IS 5 mg/dL FOR MEDICAL PURPOSES ONLY   Salicylate level     Status: None   Collection Time: 10/06/15 10:05 PM  Result Value Ref Range   Salicylate Lvl <8.0 2.8 - 30.0 mg/dL  Acetaminophen level     Status: Abnormal   Collection Time: 10/06/15 10:05 PM  Result Value Ref Range   Acetaminophen (Tylenol), Serum <10 (L) 10 - 30 ug/mL  Comment:        THERAPEUTIC CONCENTRATIONS VARY SIGNIFICANTLY. A RANGE OF 10-30 ug/mL MAY BE AN EFFECTIVE CONCENTRATION FOR MANY PATIENTS. HOWEVER, SOME ARE BEST TREATED AT CONCENTRATIONS OUTSIDE THIS RANGE. ACETAMINOPHEN CONCENTRATIONS >150 ug/mL AT 4 HOURS AFTER INGESTION AND >50 ug/mL AT 12 HOURS AFTER INGESTION ARE OFTEN ASSOCIATED WITH TOXIC REACTIONS.   CBC     Status: None   Collection Time: 10/06/15 10:05 PM  Result Value Ref Range   WBC 7.1 4.0 - 10.5 K/uL   RBC 5.03 4.22 - 5.81 MIL/uL   Hemoglobin 15.7 13.0 - 17.0 g/dL   HCT 44.2 39.0 - 52.0 %   MCV 87.9 78.0 - 100.0 fL   MCH 31.2 26.0 - 34.0 pg   MCHC 35.5 30.0 - 36.0 g/dL   RDW 13.2 11.5 - 15.5 %   Platelets 206 150 - 400 K/uL  Urine rapid drug screen (hosp performed) (Not at [facility])     Status: Abnormal   Collection Time: 10/06/15 10:05 PM  Result Value Ref Range   Opiates NONE DETECTED NONE DETECTED   Cocaine POSITIVE (A) NONE DETECTED   Benzodiazepines NONE DETECTED NONE DETECTED   Amphetamines NONE DETECTED NONE DETECTED   Tetrahydrocannabinol POSITIVE (A) NONE DETECTED   Barbiturates NONE DETECTED NONE DETECTED     Comment:        DRUG SCREEN FOR MEDICAL PURPOSES ONLY.  IF CONFIRMATION IS NEEDED FOR ANY PURPOSE, NOTIFY LAB WITHIN 5 DAYS.        LOWEST DETECTABLE LIMITS FOR URINE DRUG SCREEN Drug Class       Cutoff (ng/mL) Amphetamine      1000 Barbiturate      200 Benzodiazepine   390 Tricyclics       300 Opiates          300 Cocaine          300 THC              50     Current Facility-Administered Medications  Medication Dose Route Frequency Provider Last Rate Last Dose  . LORazepam (ATIVAN) tablet 1 mg  1 mg Oral Q4H PRN Ripley Fraise, MD   1 mg at 10/07/15 0116  . ondansetron (ZOFRAN) tablet 4 mg  4 mg Oral Q8H PRN Ripley Fraise, MD       No current outpatient prescriptions on file.    Musculoskeletal: Strength & Muscle Tone: within normal limits Gait & Station: Patient sitting on bed; did not see ambulate Patient leans: N/A  Psychiatric Specialty Exam: Review of Systems  Psychiatric/Behavioral: Positive for substance abuse. Negative for depression (Denies), suicidal ideas (Denies) and hallucinations (Denies). The patient is not nervous/anxious (Denies) and does not have insomnia (Denies).   All other systems reviewed and are negative.   Blood pressure 115/55, pulse 58, temperature 97.7 F (36.5 C), temperature source Oral, resp. rate 16, height 5' 8"  (1.727 m), weight 82.753 kg (182 lb 7 oz), SpO2 100 %.Body mass index is 27.75 kg/(m^2).  General Appearance: Disheveled  Eye Contact::  Good  Speech:  Clear and Coherent and Normal Rate  Volume:  Increased  Mood:  Irritable and Argumentative   Affect:  Labile  Thought Process:  Linear  Orientation:  Full (Time, Place, and Person)  Thought Content:  Denies hallucinations, delusions, and paranoia  Suicidal Thoughts:  No  Homicidal Thoughts:  No  Memory:  Immediate;   Good Recent;   Good Remote;   Good  Judgement:  Intact  Insight:  Fair  Psychomotor Activity:  Normal  Concentration:  Fair  Recall:  Good  Fund of  Knowledge:Good  Language: Good  Akathisia:  No  Handed:  Right  AIMS (if indicated):     Assets:  Communication Skills Desire for Improvement Physical Health  ADL's:  Intact  Cognition: WNL  Sleep:       Consulted with Dr. Dominic Pea informed that patient denies suicidal ideation, homicidal ideation, psychosis, and paranoia.  Patient stated that he was going to follow up with [facility].  Patient does not meet criteria for inpatient psych treatment and he can discharge once patient is medically cleared.    Treatment Plan Summary: Plan EDP may discharge when patient medical cleared.  Patient cleared by psych.  Patient to follow up with [facility].    Disposition: No evidence of imminent risk to self or others at present.   Patient does not meet criteria for psychiatric inpatient admission. Follow up with Nicholaus Bloom, [place], NP 10/07/2015 12:00 PM

## 2015-10-07 NOTE — Discharge Instructions (Signed)
See your doctor.   Return to ER if you have worse depression, thoughts of harming yourself or others, hallucinations.    Emergency Department Resource Guide 1) Find a Doctor and Pay Out of Pocket Although you won't have to find out who is covered by your insurance plan, it is a good idea to ask around and get recommendations. You will then need to call the office and see if the doctor you have chosen will accept you as a new patient and what types of options they offer for patients who are self-pay. Some doctors offer discounts or will set up payment plans for their patients who do not have insurance, but you will need to ask so you aren't surprised when you get to your appointment.  2) Contact Your Local Health Department Not all health departments have doctors that can see patients for sick visits, but many do, so it is worth a call to see if yours does. If you don't know where your local health department is, you can check in your phone book. The CDC also has a tool to help you locate your state's health department, and many state websites also have listings of all of their local health departments.  3) Find a Walk-in Clinic If your illness is not likely to be very severe or complicated, you may want to try a walk in clinic. These are popping up all over the country in pharmacies, drugstores, and shopping centers. They're usually staffed by nurse practitioners or physician assistants that have been trained to treat common illnesses and complaints. They're usually fairly quick and inexpensive. However, if you have serious medical issues or chronic medical problems, these are probably not your best option.  No Primary Care Doctor: - Call Health Connect at  [phone] - they can help you locate a primary care doctor that  accepts your insurance, provides certain services, etc. - Physician Referral Service- [phone]  Chronic Pain Problems: Organization         Address  Phone   Notes  [facility]  [phone] Patients need to be referred by their primary care doctor.   Medication Assistance: Organization         Address  Phone   Notes  [facility] Medication [facility] [address] [place] [place], [place] [address] [phone] --Must be a resident of [facility] -- Must have NO insurance coverage whatsoever (no Medicaid/ Medicare, etc.) -- The pt. MUST have a primary care doctor that directs their care regularly and follows them in the community   MedAssist  [phone]   [employer]  [phone]    Agencies that provide inexpensive medical care: Organization         Address  Phone   Notes  [facility]  [phone]   [facility]    [phone]   [facility] [address] [place], [place] [address] [phone]   [facility] [address] [place] [place] [phone]   Planned Parenthood    [phone]   [facility]    [phone]   [facility]  [address], [place] Phone:  [phone], Fax:  [phone] Hours of Operation:  9 am - 6 pm, M-F.  Also accepts Medicaid/Medicare and self-pay.  [facility]  [address], [place] Phone: [phone], Fax: [phone]. Hours of Operation:  8:30 am - 5:30 pm, M-F.  Also accepts Medicaid and self-pay.  [facility] [place] [address], [place] Phone: [phone]   [place], [place], [place] [phone], Ext. [phone] Mondays & Thursdays: 7-9 AM.  First 15 patients are seen on a first come, first serve basis.    [place] Providers:  Organization         Address  Phone   Notes  [facility] [address], [place] [phone] Also accepts self-pay patients.  [facility] [address], [place]  [phone]   [place], [address], [place] [phone]   [facility] [address] [place] [phone]   Lucianne Lei [address], [place]   [phone] Only accepts [place] Access [place] patients after they have their name applied to their card.   Self-Pay (no insurance) in [facility]:  Organization         Address  Phone   Notes  Sickle Cell Patients, [facility] [place] [phone]   [facility] [place] [phone]   [facility] [place]  [place], [place], [place] [phone]   [facility]/Dr. Osei-Bonsu  [address], [name] or [address], [place] [phone] Phone number for both [place] and [place] locations is the same.  Urgent Medical and [facility] [address], [place] [phone]   [facility] [address] [place] or [address] [phone]   [facility] [address] [place] [phone], phone; [phone], fax Sees patients 1st and 3rd Saturday of every month.  Must not qualify for public or private insurance (i.e. Medicaid, Medicare, [place] Health Choice, Veterans' Benefits)  Household income should be no more than 200% of the poverty level The clinic cannot treat you if you are pregnant or think you are pregnant  Sexually transmitted diseases are not treated at the clinic.    Dental Care: Organization         Address  Phone  Notes  [facility] Department of [facility] [place] [phone] Accepts children up to age 37 who are enrolled in [place] or [place]; pregnant women with a Medicaid card; and children who have applied for  Medicaid or [place] Health Choice, but were declined, whose parents can pay a reduced fee at time of service.  [facility] Department of [facility]  [address], [place] [phone] Accepts children up to age 26 who are enrolled in [place] or [place]; pregnant women with a Medicaid card; and children who have applied for Medicaid or [place] Health Choice, but were declined, whose parents can pay a reduced fee at time of service.  [facility] Access PRO[facility]  [place] [phone] Patients are seen by appointment only. Walk-ins are not accepted. [place] will see patients 43 years of age and older. Monday - Tuesday (8am-5pm) Most Wednesdays (8:30-5pm) $30 per visit, cash only  [place] Adult [employer] PROGRAM  [address], [employer] [phone])  [phone] Patients are seen by appointment only. Walk-ins are not accepted. [place] will see patients 34 years of age and older. One Wednesday Evening (Monthly: Volunteer Based).  $30 per visit, cash only  [place]  [phone] for adults; Children under age 57, call Graduate Pediatric Dentistry at [phone]. Children aged 16-14, please call [phone] to request a pediatric application.  Dental services are provided in all areas of dental care including fillings, crowns and bridges, complete and partial dentures, implants, gum treatment, root canals, and extractions. Preventive care is also provided. Treatment is provided to both adults and children. Patients are selected via a lottery and there is often a waiting list.   [facility] [address] [place]  [phone] [email or web address]   [facility] [address] [place], [place] [phone], Ext. [phone] Second and Fourth Thursday of each month, opens at 6:30 AM; Clinic ends at 9 AM.  Patients are seen on a first-come first-served basis, and a limited number  are seen during each clinic.   [facility]  [address] Hillard Danker [place], [place] [phone]   Eligibility Requirements You must have lived in [place], [place], or [place] counties for at least the last three months.   You cannot be eligible for state or federal sponsored [employer], including [employer], [place], or [employer].   You generally cannot be eligible for healthcare insurance through your employer.    How to apply: Eligibility screenings are held every Tuesday and Wednesday afternoon from 1:00 pm until 4:00 pm. You do not need an appointment for the interview!  [facility] [address] [place], [place]   [place]  [place] Department  [place]  [phone]    Behavioral Health Resources in the Community: Intensive Outpatient Programs Organization         Address  Phone  Notes  [facility]. [address], [place], [place] [phone]   [facility] [address] [place], [place]   ADS: Alcohol & Drug Svcs [address], [place], [place]   [place] [address]  [place], [place] or [phone]   Substance Abuse Resources Organization         Address  Phone  Notes  Alcohol and Drug Services  [phone]   [place]  [phone]   [facility] [place]   Chinita Pester  [phone]   Residential & Outpatient Substance Abuse Program  [phone]   Psychological Services Organization         Address  Phone  Notes  [facility] [place]  [place]  [phone]   [place] [address] [place] or [phone]    Mobile Crisis Teams Organization         Address  Phone  Notes  Therapeutic  Alternatives, Mobile Crisis Care Unit  [phone]   Assertive Psychotherapeutic Services  [address] [place], [place]   [place] Levels [address] [place] [phone]    Self-Help/Support Groups Organization         Address  Phone             Notes  [place]. of [place] - variety of support groups  [phone]  [phone] Call for more information  Narcotics Anonymous (NA), [facility] [address], [employer] [place]  2 meetings at this location   Residential Facilities manager         Address  Phone  Notes  [facility] [place],    [place]  [phone]   [facility]  [address], [place] [address], [place], [place]   [place], [place] [phone] Admissions: 8am-3pm M-F  Incentives Substance [place] [address]    [place], [place] [phone]   [facility] [address] [place], [place], [place]   [facility] [address]  [place], [place]   Insight Programs - Intensive Outpatient [address] Kristeen [address], [place], [place]   [facility] ([place].) [place].,  [place], [place] [phone] or [phone]   [facility] Treatment Services (RTS) [address], [place], [place] Accepts Medicaid  [facility] [address]  [place] [phone] Substance Abuse/Addiction Treatment   [facility] Organization         Address  Phone  Notes  [facility]  [phone]   Domenic Schwab, PhD [address] Arlis Porta [place], [place]   [phone] or [phone]   [place], [place] [phone]   [facility] [address] [place], [place] [phone] Insurance/Medicaid/sponsorship through [facility] and Families  [address] [place]                                    [place], [place] [phone] [place] [address] [place], [place] [phone]    Dr. Adele Schilder  [phone]   [facility] Dept. 1) [address], [place] 2) [place] 3)  [place] [address], [place] [phone]  [phone]   [place] [phone] or [phone] (After Hours)
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
## 2015-10-07 NOTE — ED Provider Notes (Addendum)
  Physical Exam  BP 115/55 mmHg  Pulse 58  Temp(Src) 97.7 F (36.5 C) (Oral)  Resp 16  Ht  (1.727 m)  Wt 182 lb 7 oz (82.753 kg)  BMI 27.75 kg/m2  SpO2 100%  Physical Exam  ED Course  Procedures  MDM Patient here with depression. Asleep this AM, no issues per nursing. Pending psych placement   1:07 PM Psych saw patient. Recommend discharge. Patient not under IVC. Will dc home.   Richardean Canal, MD 10/07/15 4782  Richardean Canal, MD 10/07/15 [phone]

## 2015-10-10 ENCOUNTER — Emergency Department (HOSPITAL_COMMUNITY)
Admission: EM | Admit: 2015-10-10 | Discharge: 2015-10-11 | Discharge status: Against Medical Advice | Payer: Self-pay | Attending: Emergency Medicine | Admitting: Emergency Medicine

## 2015-10-10 ENCOUNTER — Encounter (HOSPITAL_COMMUNITY): Payer: Self-pay | Admitting: Emergency Medicine

## 2015-10-10 DIAGNOSIS — F329 Major depressive disorder, single episode, unspecified: Secondary | ICD-10-CM | POA: Insufficient documentation

## 2015-10-10 DIAGNOSIS — Z590 Homelessness: Secondary | ICD-10-CM | POA: Insufficient documentation

## 2015-10-10 DIAGNOSIS — F17210 Nicotine dependence, cigarettes, uncomplicated: Secondary | ICD-10-CM | POA: Insufficient documentation

## 2015-10-10 NOTE — ED Notes (Signed)
Pt states that he has felt depressed today. Denies SI/HI. Alert and oriented. States that he is homeless.

## 2015-10-10 NOTE — ED Notes (Signed)
I CALLED PATIENT NAME SEVERAL TIMES IN THE LOBBY TO COLLECT BLOOD AND NO ANSWER.

## 2015-10-10 NOTE — ED Notes (Signed)
Pt refused blood draw,   Notified nurse. 

## 2015-10-11 NOTE — ED Notes (Signed)
Pt called in lobby w/o answer.

## 2015-10-14 ENCOUNTER — Emergency Department (HOSPITAL_COMMUNITY)
Admission: EM | Admit: 2015-10-14 | Discharge: 2015-10-15 | Disposition: A | Discharge status: Home / Self Care | Payer: Self-pay | Attending: Emergency Medicine | Admitting: Emergency Medicine

## 2015-10-14 ENCOUNTER — Encounter (HOSPITAL_COMMUNITY): Payer: Self-pay | Admitting: Emergency Medicine

## 2015-10-14 DIAGNOSIS — M79671 Pain in right foot: Secondary | ICD-10-CM | POA: Insufficient documentation

## 2015-10-14 DIAGNOSIS — F17210 Nicotine dependence, cigarettes, uncomplicated: Secondary | ICD-10-CM | POA: Insufficient documentation

## 2015-10-14 DIAGNOSIS — M79672 Pain in left foot: Secondary | ICD-10-CM | POA: Insufficient documentation

## 2015-10-14 DIAGNOSIS — F329 Major depressive disorder, single episode, unspecified: Secondary | ICD-10-CM | POA: Insufficient documentation

## 2015-10-14 DIAGNOSIS — Z880 Allergy status to penicillin: Secondary | ICD-10-CM | POA: Insufficient documentation

## 2015-10-14 DIAGNOSIS — Z0000 Encounter for general adult medical examination without abnormal findings: Secondary | ICD-10-CM | POA: Insufficient documentation

## 2015-10-14 DIAGNOSIS — M79641 Pain in right hand: Secondary | ICD-10-CM | POA: Insufficient documentation

## 2015-10-14 MED ORDER — DIPHENHYDRAMINE HCL 25 MG PO CAPS
50.0000 mg | ORAL_CAPSULE | Freq: Once | ORAL | Status: AC
  Administered 2015-10-15: 50 mg via ORAL
  Filled 2015-10-14: qty 2

## 2015-10-14 MED ORDER — METOCLOPRAMIDE HCL 10 MG PO TABS
10.0000 mg | ORAL_TABLET | Freq: Once | ORAL | Status: AC
  Administered 2015-10-15: 10 mg via ORAL
  Filled 2015-10-14: qty 1

## 2015-10-14 NOTE — ED Notes (Signed)
Patient here with various complaints. Initially stated that he was here for headache; but then when assessing pain, patient reported his headache was gone. Then patient complained that his bowel movements were a strange color, but he could not describe what was different about it. Then patient began complaining of depression for "a while". Denies SI/HI/AVH. Endorses homelessness. Denies abdominal pain. Refusing blood draw in triage.

## 2015-10-14 NOTE — ED Provider Notes (Signed)
CSN: [id number]     Arrival date & time 10/14/15  1930 History  By signing my name below, I, Doreatha Martin, attest that this documentation has been prepared under the direction and in the presence of Tomasita Crumble, MD. Electronically Signed: Doreatha Martin, ED Scribe. 10/14/2015. 11:59 PM.    Chief Complaint  Patient presents with  . Headache  . Depression   The history is provided by the patient. No language interpreter was used.    HPI Comments: Marc Hart is a 25 y.o. male with h/o depression (not medicated), schizophrenia, substance abuse who presents to the Emergency Department complaining of moderate HA onset tonight with associated depression, bilateral foot pain, right hand pain. Pt also reports occasional SI. Pt denies taking OTC medications at home to improve symptoms. Pt denies recent trauma, injury, falls that could have contributed to his foot pain. No Hx of similar foot pain. Pt notes that he occasionally has similar HA, but not as severe. Pt states he has been evaluated for his right hand pain and has had XR with no acute findings. He denies HI, fever, cough, emesis, diarrhea.   Past Medical History  Diagnosis Date  . Schizophrenia (HCC)   . Substance abuse    History reviewed. No pertinent past surgical history. History reviewed. No pertinent family history. Social History  Substance Use Topics  . Smoking status: Current Some Day Smoker -- 0.75 packs/day    Types: Cigarettes  . Smokeless tobacco: Never Used  . Alcohol Use: Yes     Comment: occ- last had beer 09-08-14    Review of Systems A complete 10 system review of systems was obtained and all systems are negative except as noted in the HPI and PMH.    Allergies  Amoxicillin; Ibuprofen; Peanuts; Tylenol; and Other  Home Medications   Prior to Admission medications   Not on File   BP 118/80 mmHg  Pulse 83  Temp(Src) 98.6 F (37 C) (Oral)  Resp 16  SpO2 100% Physical Exam  Constitutional: He is oriented  to person, place, and time. Vital signs are normal. He appears well-developed and well-nourished.  Non-toxic appearance. He does not appear ill. No distress.  HENT:  Head: Normocephalic and atraumatic.  Nose: Nose normal.  Mouth/Throat: Oropharynx is clear and moist. No oropharyngeal exudate.  Eyes: Conjunctivae and EOM are normal. Pupils are equal, round, and reactive to light. No scleral icterus.  Neck: Normal range of motion. Neck supple. No tracheal deviation, no edema, no erythema and normal range of motion present. No thyroid mass and no thyromegaly present.  Cardiovascular: Normal rate, regular rhythm, S1 normal, S2 normal, normal heart sounds, intact distal pulses and normal pulses.  Exam reveals no gallop and no friction rub.   No murmur heard. Pulmonary/Chest: Effort normal and breath sounds normal. No respiratory distress. He has no wheezes. He has no rhonchi. He has no rales.  Abdominal: Soft. Normal appearance and bowel sounds are normal. He exhibits no distension, no ascites and no mass. There is no hepatosplenomegaly. There is no tenderness. There is no rebound, no guarding and no CVA tenderness.  Musculoskeletal: Normal range of motion. He exhibits no edema or tenderness.  Lymphadenopathy:    He has no cervical adenopathy.  Neurological: He is alert and oriented to person, place, and time. He has normal strength. No cranial nerve deficit or sensory deficit.  Skin: Skin is warm, dry and intact. No petechiae and no rash noted. He is not diaphoretic. No erythema.  No pallor.  Psychiatric:  depression  Nursing note and vitals reviewed.   ED Course  Procedures (including critical care time) DIAGNOSTIC STUDIES: Oxygen Saturation is 100% on RA, normal by my interpretation.    COORDINATION OF CARE: 11:57 PM Discussed treatment plan with pt at bedside and pt agreed to plan.   Labs Review Labs Reviewed - No data to display  I have personally reviewed and evaluated these lab  results as part of my medical decision-making.  MDM   Final diagnoses:  Well adult exam    Patient has come back into his room and states he wants to be seen now.  He complains of headache and feet pain.  He states he is depressed and has had suicidal thoughts in the past but not currently.  TTS was consulted but patient refuses to speak to him.  I do not believe the patient is acutely suicidal and is asking for food in the ED.  He was given referrals for outpatient resources.  His physical exam is normal.  VS remain within his normal limits and he is safe for DC.    I personally performed the services described in this documentation, which was scribed in my presence. The recorded information has been reviewed and is accurate.     Tomasita Crumble, MD 10/15/15 [phone]

## 2015-10-14 NOTE — ED Notes (Signed)
The pt came to triage desk stating that he was leaving but would not give me his name.  He said it was not important that i know.  i recognized him when his name was called

## 2015-10-14 NOTE — ED Provider Notes (Signed)
CSN: [id number]     Arrival date & time 10/14/15  1930 History   First MD Initiated Contact with Patient 10/14/15 2343     Chief Complaint  Patient presents with  . Headache  . Depression     (Consider location/radiation/quality/duration/timing/severity/associated sxs/prior Treatment) HPI   [name] is a 25yo male, h/o schizophrenia and substance abuse, here for possible headache.  Patient does not give further details.  Past Medical History  Diagnosis Date  . Schizophrenia (HCC)   . Substance abuse    History reviewed. No pertinent past surgical history. History reviewed. No pertinent family history. Social History  Substance Use Topics  . Smoking status: Current Some Day Smoker -- 0.75 packs/day    Types: Cigarettes  . Smokeless tobacco: Never Used  . Alcohol Use: Yes     Comment: occ- last had beer 09-08-14    Review of Systems  Unable to perform  Allergies  Amoxicillin; Ibuprofen; Peanuts; Tylenol; and Other  Home Medications   Prior to Admission medications   Not on File   BP 118/80 mmHg  Pulse 83  Temp(Src) 98.6 F (37 C) (Oral)  Resp 16  SpO2 100% Physical Exam Unable to perform ED Course  Procedures (including critical care time) Labs Review Labs Reviewed - No data to display  Imaging Review No results found. I have personally reviewed and evaluated these images and lab results as part of my medical decision-making.   EKG Interpretation None      MDM   Final diagnoses:  Well adult exam    Upon entering the room, Patient initially stated that his head hurts.  When I asked details, he tells me that  He never said his head hurt.  He then stood up and said he wants to leave because we can not help him.  Patient eloped from the emergency department within 2 min of me entering the room.  I could not offer the patient any additional care or return precautions.    Tomasita Crumble, MD 10/14/15 2350

## 2015-10-15 NOTE — ED Notes (Signed)
Pt given "happy meal" Pt ate peanut butter. Pt allergy list states he is allergic. Pt would not give a clear answer on his reaction when he has peanuts. Pt states "its just in there." Through further eval pt states that it "upsets his stomach" This RN clarified that the reaction is diarrhea.

## 2015-10-15 NOTE — ED Notes (Signed)
TTS provided.

## 2015-10-15 NOTE — BH Assessment (Signed)
Attempted tele-assessment. Pt initially appeared annoyed and said he presented to the ED because he was depressed and had a headache. Pt then refused to answer any questions and just stared at the camera. Pt has a history of substance abuse, schizophrenia, homelessness and multiple presentations to the ED. He also has a history of refusing to participate in mental health assessments. Explained situation to Dr. Tomasita Crumble who agreed to contact TTS if Pt becomes cooperative and agrees to mental health assessment.  Marc Hart, LPC, [facility], [facility] Triage Specialist [phone]

## 2015-10-15 NOTE — Discharge Instructions (Signed)
Depression: How to Help Yourself Marc Hart, see a primary care doctor within 3 days for close follow up.  If symptoms worsen, come back to the ED immediately.  Thank you. Suicide is the taking of one's own life. If you feel as though life is getting too tough to handle and are thinking about suicide, get help right away. To get help:  Call your local emergency services (911 in the [place]  Call a suicide hotline to speak with a trained counselor who understands how you are feeling. The following is a list of suicide hotlines in the [place]. For a list of hotlines in [place], visit InkDistributor.it.  1-800-273-TALK (904) 726-2221).  1-800-SUICIDE (830) 390-9874).  (667) 235-7182. This is a hotline for Spanish speakers.  4-696-295-2WUX 437-826-8157). This is a hotline for TTY users.  1-866-4-U-TREVOR 872-826-5654). This is a hotline for lesbian, gay, bisexual, transgender, or questioning youth.  Contact a crisis center or a local suicide prevention center. To find a crisis center or suicide prevention center:  Call your local hospital, clinic, community service organization, mental health center, social service provider, or health department. Ask for assistance in connecting to a crisis center.  Visit https://www.patel-king.com/ for a list of crisis centers in the [place], or visit www.suicideprevention.ca/thinking-about-suicide/find-a-crisis-centre for a list of centers in [place].  Visit the following websites:  National Suicide Prevention Lifeline: www.suicidepreventionlifeline.org  Hopeline: www.hopeline.com  [employer] for Suicide Prevention: https://www.ayers.com/  [employer] (for lesbian, gay, bisexual, transgender, or questioning youth): www.thetrevorproject.org HOW CAN I HELP MYSELF FEEL BETTER?  Promise yourself that you will not do anything drastic when you have  suicidal feelings. Remember, there is hope. Many people have gotten through suicidal thoughts and feelings, and you will, too. You may have gotten through them before, and this proves that you can get through them again.  Let family, friends, teachers, or counselors know how you are feeling. Try not to isolate yourself from those who care about you. Remember, they will want to help you. Talk with someone every day, even if you do not feel sociable. Face-to-face conversation is best.  Call a mental health professional and see one regularly.  Visit your primary health care provider every year.  Eat a well-balanced diet, and space your meals so you eat regularly.  Get plenty of rest.  Avoid alcohol and drugs, and remove them from your home. They will only make you feel worse.  If you are thinking of taking a lot of medicine, give your medicine to someone who can give it to you one day at a time. If you are on antidepressants and are concerned you will overdose, let your health care provider know so he or she can give you safer medicines. Ask your mental health professional about the possible side effects of any medicines you are taking.  Remove weapons, poisons, knives, and anything else that could harm you from your home.  Try to stick to routines. Follow a schedule every day. Put self-care on your schedule.  Make a list of realistic goals, and cross them off when you achieve them. Accomplishments give a sense of worth.  Wait until you are feeling better before doing the things you find difficult or unpleasant.  Exercise if you are able. You will feel better if you exercise for even a half hour each day.  Go out in the sun or into nature. This will help you recover from depression faster. If you have a favorite place to walk, go there.  Do  the things that have always given you pleasure. Play your favorite music, read a good book, paint a picture, play your favorite instrument, or do  anything else that takes your mind off your depression if it is safe to do.  Keep your living space well lit.  When you are feeling well, write yourself a letter about tips and support that you can read when you are not feeling well.  Remember that life's difficulties can be sorted out with help. Conditions can be treated. You can work on thoughts and strategies that serve you well.   This information is not intended to replace advice given to you by your health care provider. Make sure you discuss any questions you have with your health care provider.   Document Released: 02/24/2003 Document Revised: 09/10/2014 Document Reviewed: 12/15/2013 Elsevier Interactive Patient Education 2016 [employer]. Substance Abuse Treatment Programs  Intensive Outpatient Programs [facility] Services     [address]      [place], [place]                   [phone]       [facility] [address] [place] [address] [place], [place] [phone]  [facility]     (Inpatient and outpatient)     [address]           [phone]    [facility] [phone] (Suboxone and Methadone)  [address]      [place], [place] [address]      [phone]       [address] [place], [place] [phone]  [facility] (Outpatient/Inpatient, Chemical)    (insurance only) [phone]             [facility] (Groups & Residential) [place], [place] [phone]     [facility]     [address]     [place], [place]      [phone]       [facility] (for caregivers and family) [phone] [address] Laure[address] [place], [place] [phone]      Residential Treatment Programs [facility]      [address] [place], [place] [address]  [phone]       [facility] [address] [place], [place] [address] [phone]  [facility] [place]        [phone]       [facility]  [facility]argo [facility] [phone]  [facility] ([facility]             [address]                                         [place], [place]                                                [phone] or [phone]                               [facility] [address] [place], [address] [phone]  [facility]    [address]      [place], [place]  [phone]       [facility] [address] [place], [place] [phone]  [facility]   [address] [place], [place] [address]     [phone]      Admissions: 8am-3pm M-F  [facility] ([facility]) [address] [place], [place] [phone]  [facility]: Residential Program [phone] Days)   [place], [place]      [phone] or [phone]     [facility]: [facility] [place], [place] (Walk in Hours over the weekend or by referral)  [facility] [address] [place], [place], [place] [address] [phone]  Crisis Mobile: [facility]:  [phone] (for crisis response 24 hours a day) [facility] Hotline:      [phone] Outpatient Psychiatry and Counseling  [facility]: Mobile Crisis Management 24 hours:  [phone]  [facility] of the [employer] sliding scale fee and walk in schedule: M-F 8am-12pm/1pm-3pm [address]  [place], [place] [address] [phone]  [facility] [address] [place], [place] [address] [phone]  [facility] (Formerly known as [facility] [employer])- new patient walk-in appointments available Monday - Friday 8am -3pm.          [address] [place], [place] [address] [phone] or crisis line- [phone]  [facility] Services/ Intensive Outpatient Therapy Program [address] [place], [place] [address] [phone]  [facility]                  Crisis Services      [phone] [address]     [place], [place] [address]                 [facility]   [facility] [phone]. [address] [place], [place] [address]   [facility] of Care          [address]  [place], [place] [address]       [phone]  [facility] [address] [place], [place] [address] [phone]  [facility]    [address]    [place], [place] [address]     [phone]       Andee Poles, MD     [address]     [place] [address]     [phone]       [facility] [address] [place] [address]  [facility]     [address] [place], [place]      [phone]       [facility] Eulogio Ditch, MD [address] [place], [place] [address] [phone]  [facility]     [address]     [place], [place] [address]     [phone]       [facility] [address] [place], [place] [address] [phone] Resources for Temporary Residential Assistance/Crisis Centers  [facility]ENT[facility]) M-F 8am-3pm   [address] [place], [place] [address]   [phone] Services include: laundry, barbering, support groups, case management, phone  & computer access, showers, AA/NA mtgs, mental health/substance abuse nurse, job skills class, disability information, VA assistance, spiritual classes, etc.   HOMELESS SHELTERS  [employer]  [facility]   [address] [place]     [phone]              [address] (women and children)       [address] [place], [place] [address] [phone] [email or web address] for application and process Application Required  [facility]   [address]    [place] [address]     [phone]                    [facility] [address] [place], [place] [address] [phone] application appt.) Application Required  [facility] (women only)    [address]     [place], [place] [address]     [phone]      Intake starts 6pm daily Need valid ID, SSC, & Police report [employer] [address] [place], [place] [phone] Application Required  [employer] (men only)     [address]      [place], [place]     [phone]       [facility] the [place] (Pregnant women only) [address] [place], [place] [phone]  [facility]      [address]      [place], [place] [address]     [phone]             [facility] [address] [place], [place] [phone] 90 day commitment/SA/Application process  [facility](men only)     [address]     [place], [place]     [phone]       Check-in at [facility] [address] [place], [place] [address] [phone] Men/Women/Women and Children must be there by 7 pm  [facility] [place], [place] [phone]

## 2015-10-15 NOTE — ED Notes (Signed)
Verbal order to discontinue labs from pt

## 2015-10-16 ENCOUNTER — Emergency Department (HOSPITAL_COMMUNITY)
Admission: EM | Admit: 2015-10-16 | Discharge: 2015-10-16 | Discharge status: Against Medical Advice | Payer: Self-pay | Attending: Emergency Medicine | Admitting: Emergency Medicine

## 2015-10-16 DIAGNOSIS — R45850 Homicidal ideations: Secondary | ICD-10-CM | POA: Insufficient documentation

## 2015-10-16 DIAGNOSIS — R51 Headache: Secondary | ICD-10-CM | POA: Insufficient documentation

## 2015-10-16 DIAGNOSIS — F17210 Nicotine dependence, cigarettes, uncomplicated: Secondary | ICD-10-CM | POA: Insufficient documentation

## 2015-10-16 NOTE — ED Notes (Signed)
Pt c/o headache for 20 minutes. Pt looking around the room nervously, difficulty following commands. Pt poor historian. Pt now states hes having homicidal thoughts. Pt billigerent and not answering questions now.

## 2015-10-16 NOTE — ED Notes (Signed)
Pt refusing to answer questions

## 2015-10-16 NOTE — ED Notes (Signed)
Spoke with Dr. Jeraldine Loots to come assess patient.

## 2015-10-16 NOTE — ED Notes (Signed)
Security at bedside

## 2015-10-16 NOTE — ED Notes (Signed)
Security at bedside. Pt refusing to stay. Ambulated outside ED without issue.

## 2015-10-17 ENCOUNTER — Encounter (HOSPITAL_COMMUNITY): Payer: Self-pay | Admitting: Emergency Medicine

## 2015-10-17 DIAGNOSIS — F17210 Nicotine dependence, cigarettes, uncomplicated: Secondary | ICD-10-CM | POA: Insufficient documentation

## 2015-10-17 DIAGNOSIS — R5383 Other fatigue: Secondary | ICD-10-CM | POA: Insufficient documentation

## 2015-10-17 DIAGNOSIS — R531 Weakness: Secondary | ICD-10-CM | POA: Insufficient documentation

## 2015-10-17 LAB — URINALYSIS, ROUTINE W REFLEX MICROSCOPIC
BILIRUBIN URINE: NEGATIVE
GLUCOSE, UA: NEGATIVE mg/dL
HGB URINE DIPSTICK: NEGATIVE
Ketones, ur: NEGATIVE mg/dL
Leukocytes, UA: NEGATIVE
Nitrite: NEGATIVE
PROTEIN: NEGATIVE mg/dL
Specific Gravity, Urine: 1.022 (ref 1.005–1.030)
pH: 5.5 (ref 5.0–8.0)

## 2015-10-17 LAB — CBC WITH DIFFERENTIAL/PLATELET
BASOS ABS: 0 10*3/uL (ref 0.0–0.1)
Basophils Relative: 0 %
Eosinophils Absolute: 0.1 10*3/uL (ref 0.0–0.7)
Eosinophils Relative: 1 %
HEMATOCRIT: 43.3 % (ref 39.0–52.0)
HEMOGLOBIN: 15.2 g/dL (ref 13.0–17.0)
LYMPHS PCT: 28 %
Lymphs Abs: 2.2 10*3/uL (ref 0.7–4.0)
MCH: 31.1 pg (ref 26.0–34.0)
MCHC: 35.1 g/dL (ref 30.0–36.0)
MCV: 88.7 fL (ref 78.0–100.0)
MONO ABS: 0.5 10*3/uL (ref 0.1–1.0)
Monocytes Relative: 6 %
NEUTROS ABS: 5.1 10*3/uL (ref 1.7–7.7)
NEUTROS PCT: 65 %
Platelets: 219 10*3/uL (ref 150–400)
RBC: 4.88 MIL/uL (ref 4.22–5.81)
RDW: 12.9 % (ref 11.5–15.5)
WBC: 8 10*3/uL (ref 4.0–10.5)

## 2015-10-17 LAB — COMPREHENSIVE METABOLIC PANEL
ALK PHOS: 74 U/L (ref 38–126)
ALT: 14 U/L — AB (ref 17–63)
AST: 19 U/L (ref 15–41)
Albumin: 4.3 g/dL (ref 3.5–5.0)
Anion gap: 11 (ref 5–15)
BILIRUBIN TOTAL: 0.6 mg/dL (ref 0.3–1.2)
BUN: 19 mg/dL (ref 6–20)
CALCIUM: 9.2 mg/dL (ref 8.9–10.3)
CO2: 24 mmol/L (ref 22–32)
CREATININE: 0.94 mg/dL (ref 0.61–1.24)
Chloride: 105 mmol/L (ref 101–111)
GFR calc Af Amer: >60 mL/min (ref >60)
GLUCOSE: 95 mg/dL (ref 65–99)
Potassium: 3.9 mmol/L (ref 3.5–5.1)
Sodium: 140 mmol/L (ref 135–145)
TOTAL PROTEIN: 6.5 g/dL (ref 6.5–8.1)

## 2015-10-17 NOTE — ED Notes (Signed)
Pt. reports fatigue / generalized weakness onset this evening , no fever or chills , denies nausea or vomitting.

## 2015-10-18 ENCOUNTER — Emergency Department (HOSPITAL_COMMUNITY)
Admission: EM | Admit: 2015-10-18 | Discharge: 2015-10-18 | Discharge status: Against Medical Advice | Payer: Self-pay | Attending: Emergency Medicine | Admitting: Emergency Medicine

## 2015-10-18 NOTE — ED Notes (Signed)
Pt up to nurse first desk, stating that he will be leaving and just will go to [facility] at this time

## 2015-10-20 ENCOUNTER — Emergency Department (HOSPITAL_COMMUNITY)
Admission: EM | Admit: 2015-10-20 | Discharge: 2015-10-20 | Disposition: A | Discharge status: Home / Self Care | Payer: Self-pay | Attending: Emergency Medicine | Admitting: Emergency Medicine

## 2015-10-20 ENCOUNTER — Encounter (HOSPITAL_COMMUNITY): Payer: Self-pay | Admitting: *Deleted

## 2015-10-20 DIAGNOSIS — F329 Major depressive disorder, single episode, unspecified: Secondary | ICD-10-CM | POA: Insufficient documentation

## 2015-10-20 DIAGNOSIS — F10230 Alcohol dependence with withdrawal, uncomplicated: Secondary | ICD-10-CM | POA: Insufficient documentation

## 2015-10-20 DIAGNOSIS — F17210 Nicotine dependence, cigarettes, uncomplicated: Secondary | ICD-10-CM | POA: Insufficient documentation

## 2015-10-20 DIAGNOSIS — Z79899 Other long term (current) drug therapy: Secondary | ICD-10-CM | POA: Insufficient documentation

## 2015-10-20 DIAGNOSIS — F32A Depression, unspecified: Secondary | ICD-10-CM

## 2015-10-20 DIAGNOSIS — Z880 Allergy status to penicillin: Secondary | ICD-10-CM | POA: Insufficient documentation

## 2015-10-20 LAB — COMPREHENSIVE METABOLIC PANEL
ALBUMIN: 4.3 g/dL (ref 3.5–5.0)
ALK PHOS: 74 U/L (ref 38–126)
ALT: 17 U/L (ref 17–63)
ANION GAP: 12 (ref 5–15)
AST: 22 U/L (ref 15–41)
BUN: 14 mg/dL (ref 6–20)
CHLORIDE: 105 mmol/L (ref 101–111)
CO2: 25 mmol/L (ref 22–32)
Calcium: 9.3 mg/dL (ref 8.9–10.3)
Creatinine, Ser: 0.92 mg/dL (ref 0.61–1.24)
GFR calc non Af Amer: >60 mL/min (ref >60)
GLUCOSE: 94 mg/dL (ref 65–99)
POTASSIUM: 4.3 mmol/L (ref 3.5–5.1)
SODIUM: 142 mmol/L (ref 135–145)
Total Bilirubin: 1 mg/dL (ref 0.3–1.2)
Total Protein: 6.7 g/dL (ref 6.5–8.1)

## 2015-10-20 LAB — CBC
HCT: 44.4 % (ref 39.0–52.0)
HEMOGLOBIN: 15.7 g/dL (ref 13.0–17.0)
MCH: 30.9 pg (ref 26.0–34.0)
MCHC: 35.4 g/dL (ref 30.0–36.0)
MCV: 87.4 fL (ref 78.0–100.0)
PLATELETS: 226 10*3/uL (ref 150–400)
RBC: 5.08 MIL/uL (ref 4.22–5.81)
RDW: 12.6 % (ref 11.5–15.5)
WBC: 7.6 10*3/uL (ref 4.0–10.5)

## 2015-10-20 LAB — ETHANOL: ALCOHOL ETHYL (B): 99 mg/dL — AB (ref <5)

## 2015-10-20 NOTE — ED Notes (Signed)
Pt states "I am not going to triage. I want medical attention[facility] I explained to the patient to get medical attention you must first go to triage and talk to the nurse. He stated he is going to go to [facility] instead.

## 2015-10-20 NOTE — ED Notes (Signed)
The pt   Reports that he does not want to see a doctor he keeps telling everyone  [facility]  He feels like his life is in danger

## 2015-10-20 NOTE — Discharge Instructions (Signed)
Alcohol Use Disorder Marc Hart, see a primary care doctor within 3 days to help with your depression and alcohol use.  If any symptoms worsen, come back to the ED immediately. Thank you. Alcohol use disorder is a mental disorder. It is not a one-time incident of heavy drinking. Alcohol use disorder is the excessive and uncontrollable use of alcohol over time that leads to problems with functioning in one or more areas of daily living. People with this disorder risk harming themselves and others when they drink to excess. Alcohol use disorder also can cause other mental disorders, such as mood and anxiety disorders, and serious physical problems. People with alcohol use disorder often misuse other drugs.  Alcohol use disorder is common and widespread. Some people with this disorder drink alcohol to cope with or escape from negative life events. Others drink to relieve chronic pain or symptoms of mental illness. People with a family history of alcohol use disorder are at higher risk of losing control and using alcohol to excess.  Drinking too much alcohol can cause injury, accidents, and health problems. One drink can be too much when you are:  Working.  Pregnant or breastfeeding.  Taking medicines. Ask your doctor.  Driving or planning to drive. SYMPTOMS  Signs and symptoms of alcohol use disorder may include the following:   Consumption ofalcohol inlarger amounts or over a longer period of time than intended.  Multiple unsuccessful attempts to cutdown or control alcohol use.   A great deal of time spent obtaining alcohol, using alcohol, or recovering from the effects of alcohol (hangover).  A strong desire or urge to use alcohol (cravings).   Continued use of alcohol despite problems at work, school, or home because of alcohol use.   Continued use of alcohol despite problems in relationships because of alcohol use.  Continued use of alcohol in situations when it is physically  hazardous, such as driving a car.  Continued use of alcohol despite awareness of a physical or psychological problem that is likely related to alcohol use. Physical problems related to alcohol use can involve the brain, heart, liver, stomach, and intestines. Psychological problems related to alcohol use include intoxication, depression, anxiety, psychosis, delirium, and dementia.   The need for increased amounts of alcohol to achieve the same desired effect, or a decreased effect from the consumption of the same amount of alcohol (tolerance).  Withdrawal symptoms upon reducing or stopping alcohol use, or alcohol use to reduce or avoid withdrawal symptoms. Withdrawal symptoms include:  Racing heart.  Hand tremor.  Difficulty sleeping.  Nausea.  Vomiting.  Hallucinations.  Restlessness.  Seizures. DIAGNOSIS Alcohol use disorder is diagnosed through an assessment by your health care provider. Your health care provider may start by asking three or four questions to screen for excessive or problematic alcohol use. To confirm a diagnosis of alcohol use disorder, at least two symptoms must be present within a 60-month period. The severity of alcohol use disorder depends on the number of symptoms:  Mild--two or three.  Moderate--four or five.  Severe--six or more. Your health care provider may perform a physical exam or use results from lab tests to see if you have physical problems resulting from alcohol use. Your health care provider may refer you to a mental health professional for evaluation. TREATMENT  Some people with alcohol use disorder are able to reduce their alcohol use to low-risk levels. Some people with alcohol use disorder need to quit drinking alcohol. When necessary, mental health professionals  with specialized training in substance use treatment can help. Your health care provider can help you decide how severe your alcohol use disorder is and what type of treatment you  need. The following forms of treatment are available:   Detoxification. Detoxification involves the use of prescription medicines to prevent alcohol withdrawal symptoms in the first week after quitting. This is important for people with a history of symptoms of withdrawal and for heavy drinkers who are likely to have withdrawal symptoms. Alcohol withdrawal can be dangerous and, in severe cases, cause death. Detoxification is usually provided in a hospital or in-patient substance use treatment facility.  Counseling or talk therapy. Talk therapy is provided by substance use treatment counselors. It addresses the reasons people use alcohol and ways to keep them from drinking again. The goals of talk therapy are to help people with alcohol use disorder find healthy activities and ways to cope with life stress, to identify and avoid triggers for alcohol use, and to handle cravings, which can cause relapse.  Medicines.Different medicines can help treat alcohol use disorder through the following actions:  Decrease alcohol cravings.  Decrease the positive reward response felt from alcohol use.  Produce an uncomfortable physical reaction when alcohol is used (aversion therapy).  Support groups. Support groups are run by people who have quit drinking. They provide emotional support, advice, and guidance. These forms of treatment are often combined. Some people with alcohol use disorder benefit from intensive combination treatment provided by specialized substance use treatment centers. Both inpatient and outpatient treatment programs are available.   This information is not intended to replace advice given to you by your health care provider. Make sure you discuss any questions you have with your health care provider.   Document Released: 09/27/2004 Document Revised: 09/10/2014 Document Reviewed: 11/27/2012 Elsevier Interactive Patient Education 2016 Elsevier Inc. Major Depressive Disorder Major  depressive disorder is a mental illness. It also may be called clinical depression or unipolar depression. Major depressive disorder usually causes feelings of sadness, hopelessness, or helplessness. Some people with this disorder do not feel particularly sad but lose interest in doing things they used to enjoy (anhedonia). Major depressive disorder also can cause physical symptoms. It can interfere with work, school, relationships, and other normal everyday activities. The disorder varies in severity but is longer lasting and more serious than the sadness we all feel from time to time in our lives. Major depressive disorder often is triggered by stressful life events or major life changes. Examples of these triggers include divorce, loss of your job or home, a move, and the death of a family member or close friend. Sometimes this disorder occurs for no obvious reason at all. People who have family members with major depressive disorder or bipolar disorder are at higher risk for developing this disorder, with or without life stressors. Major depressive disorder can occur at any age. It may occur just once in your life (single episode major depressive disorder). It may occur multiple times (recurrent major depressive disorder). SYMPTOMS People with major depressive disorder have either anhedonia or depressed mood on nearly a daily basis for at least 2 weeks or longer. Symptoms of depressed mood include:  Feelings of sadness (blue or down in the dumps) or emptiness.  Feelings of hopelessness or helplessness.  Tearfulness or episodes of crying (may be observed by others).  Irritability (children and adolescents). In addition to depressed mood or anhedonia or both, people with this disorder have at least four of the following  symptoms:  Difficulty sleeping or sleeping too much.   Significant change (increase or decrease) in appetite or weight.   Lack of energy or motivation.  Feelings of guilt and  worthlessness.   Difficulty concentrating, remembering, or making decisions.  Unusually slow movement (psychomotor retardation) or restlessness (as observed by others).   Recurrent wishes for death, recurrent thoughts of self-harm (suicide), or a suicide attempt. People with major depressive disorder commonly have persistent negative thoughts about themselves, other people, and the world. People with severe major depressive disorder may experiencedistorted beliefs or perceptions about the world (psychotic delusions). They also may see or hear things that are not real (psychotic hallucinations). DIAGNOSIS Major depressive disorder is diagnosed through an assessment by your health care provider. Your health care provider will ask aboutaspects of your daily life, such as mood,sleep, and appetite, to see if you have the diagnostic symptoms of major depressive disorder. Your health care provider may ask about your medical history and use of alcohol or drugs, including prescription medicines. Your health care provider also may do a physical exam and blood work. This is because certain medical conditions and the use of certain substances can cause major depressive disorder-like symptoms (secondary depression). Your health care provider also may refer you to a mental health specialist for further evaluation and treatment. TREATMENT It is important to recognize the symptoms of major depressive disorder and seek treatment. The following treatments can be prescribed for this disorder:   Medicine. Antidepressant medicines usually are prescribed. Antidepressant medicines are thought to correct chemical imbalances in the brain that are commonly associated with major depressive disorder. Other types of medicine may be added if the symptoms do not respond to antidepressant medicines alone or if psychotic delusions or hallucinations occur.  Talk therapy. Talk therapy can be helpful in treating major depressive  disorder by providing support, education, and guidance. Certain types of talk therapy also can help with negative thinking (cognitive behavioral therapy) and with relationship issues that trigger this disorder (interpersonal therapy). A mental health specialist can help determine which treatment is best for you. Most people with major depressive disorder do well with a combination of medicine and talk therapy. Treatments involving electrical stimulation of the brain can be used in situations with extremely severe symptoms or when medicine and talk therapy do not work over time. These treatments include electroconvulsive therapy, transcranial magnetic stimulation, and vagal nerve stimulation.   This information is not intended to replace advice given to you by your health care provider. Make sure you discuss any questions you have with your health care provider.   Document Released: 12/15/2012 Document Revised: 09/10/2014 Document Reviewed: 12/15/2012 Elsevier Interactive Patient Education 2016 [employer]. Substance Abuse Treatment Programs  Intensive Outpatient Programs [facility]     [address]      [place], [place]                   [phone]       [facility] [address] [place] [address] [place], [place] [phone]  [facility]     (Inpatient and outpatient)     [address]           [phone]    [facility] [phone] (Suboxone and Methadone)  [address]      [place], [place] [address]      [phone]       [address] [place],  [place] [phone]  [facility] (Outpatient/Inpatient, [facility])    (insurance only) [phone]             [facility] (Groups & Residential) [place], [place] [phone]     [facility]     [address]     [place], [place]      [phone]       [facility] (for caregivers and family) [phone]  [address] [place], [place] [phone]      Residential Treatment Programs [facility]      [address] [place], [place] [address]  [phone]       [facility] [address] [place], [place] [address] [phone]  [facility] [place]        [phone]       [facility] [phone]  [facility] ([facility]             [address]                                         [place], [place]                                                [phone] or [phone]                               [facility] [place] [address] [place], [address] [phone]  [facility]    [address]      [place], [place]     [phone]       [facility] [address] [place], [place] [phone]  [facility]   [address] [place], [place] [address]     [phone]      Admissions: 8am-3pm M-F  [facility] ([facility]) [address] [place], [place] [phone]  [facility]: Residential Program [phone] Days)   [place], [place]      [phone] or [phone]     [facility]: [facility] [place], [place] (Walk in Hours over the weekend or by referral)  [facility] [address] [place], [place], [place] [address] [phone]  Crisis Mobile: [facility]:  [phone] (for crisis response 24 hours a day) [facility] Hotline:      [phone] Outpatient Psychiatry and Counseling  [facility]: Mobile Crisis Management 24 hours:  [phone]  [facility] of [facility] [employer] sliding scale fee and walk in schedule: M-F 8am-12pm/1pm-3pm [address]  [place], [place] [address] [phone]  [facility] [address] [place], [place] [address] [phone]  [facility] ([facility] [employer])- new patient walk-in appointments available  Monday - Friday 8am -3pm.          [address] [place], [place] [address] [phone] or crisis line- [phone]  [facility] Out[facility] Services/ Intensive Outpatient Therapy Program [address] [place], [place] [address] [phone]  [facility]                  Crisis Services      [phone] N[address]  [address]     [place], [place] [address]                 [facility]   [facility] [phone]. [address] [place], [place] [address]   [employer] of Care          [address] [place]  [place], [place] [address]       [phone]  [facility] [address] [place], [place] [address] [phone]  [facility]    [address]    [place], [place] [address]     [phone]       Andee Poles, MD     [address]     [place] [address]     [phone]       [facility] [address] [place] [address]  [facility]     [address] [place], [place]      [phone]       [facility] Eulogio Ditch, MD [address] [place], [place] [address] [phone]  [facility]     [address]     [place], [place] [address]     [phone]       [facility] [address] [place], [place] [address] [phone] Resources for Temporary Residential Assistance/Crisis Centers  [facility]) M-F 8am-3pm   [address] [place], [place] [address]   [phone] Services include: laundry, barbering, support groups, case management, phone  & computer access, showers, AA/NA mtgs, mental health/substance abuse nurse, job skills class, disability information, VA assistance, spiritual classes, etc.   HOMELESS SHELTERS  [facility]     [employer]  Shelter   [address] [place]     [phone]              [employer] (women and children)       [address] [place], [place] [address] [phone] [email or web address] for application and process Application Required  [facility] [employer] [facility]   [address]    [place] [address]     [phone]                    [facility] [address] [place], [place] [address] [phone] application appt.) Application Required  [facility] (women only)    [address]     [place], [place] [address]     [phone]      Intake starts 6pm daily Need valid ID, SSC, & Police report [employer] [address] [place], [place] [phone] Application Required  [employer] (men only)     [address]      [place], [place]     [phone]       [facility] [place] (Pregnant women only) [address] [place], [place] [phone]  [facility]      [address]      [place], [place] [address]     [phone]             [facility] [address] [place], [place] [phone] 90 day commitment/SA/Application process  [facility](men only)     [phone]  [address]     [place], [place]     [phone]       Check-in at [facility] [address] [place], [place] [address] [phone] Men/Women/Women and Children must be there by 7 pm  [facility] [place], [place] [phone]

## 2015-10-20 NOTE — ED Provider Notes (Signed)
CSN: [id number]     Arrival date & time 10/20/15  0026 History   First MD Initiated Contact with Patient 10/20/15 0547     Chief Complaint  Patient presents with  . Psychiatric Evaluation     (Consider location/radiation/quality/duration/timing/severity/associated sxs/prior Treatment) HPI  Marc Hart is a 25 y.o. male with PMH of substance abuse and alcohol abuse, presenting today because he is fearing for his life.  He states he is not sure if people he is living with are trying to hurt him.  He also states [facility]  He states he was told he has to come here first for evaluation.  He is currently denying SI or HI.  There are no further complaints.  10 Systems reviewed and are negative for acute change except as noted in the HPI.     Past Medical History  Diagnosis Date  . Schizophrenia (HCC)   . Substance abuse    History reviewed. No pertinent past surgical history. No family history on file. Social History  Substance Use Topics  . Smoking status: Current Some Day Smoker -- 0.75 packs/day    Types: Cigarettes  . Smokeless tobacco: Never Used  . Alcohol Use: Yes    Review of Systems    Allergies  Amoxicillin; Ibuprofen; Peanuts; Tylenol; and Other  Home Medications   Prior to Admission medications   Medication Sig Start Date End Date Taking? Authorizing Provider  Pediatric Multiple Vit-C-FA (PEDIATRIC MULTIVITAMIN) chewable tablet Chew 1 tablet by mouth daily.    Historical Provider, MD   BP 112/77 mmHg  Pulse 68  Temp(Src) 98.2 F (36.8 C)  Resp 20  SpO2 100% Physical Exam  Constitutional: He is oriented to person, place, and time. Vital signs are normal. He appears well-developed and well-nourished.  Non-toxic appearance. He does not appear ill. No distress.  HENT:  Head: Normocephalic and atraumatic.  Nose: Nose normal.  Mouth/Throat: Oropharynx is clear and moist. No oropharyngeal exudate.   Eyes: Conjunctivae and EOM are normal. Pupils are equal, round, and reactive to light. No scleral icterus.  Neck: Normal range of motion. Neck supple. No tracheal deviation, no edema, no erythema and normal range of motion present. No thyroid mass and no thyromegaly present.  Cardiovascular: Normal rate, regular rhythm, S1 normal, S2 normal, normal heart sounds, intact distal pulses and normal pulses.  Exam reveals no gallop and no friction rub.   No murmur heard. Pulmonary/Chest: Effort normal and breath sounds normal. No respiratory distress. He has no wheezes. He has no rhonchi. He has no rales.  Abdominal: Soft. Normal appearance and bowel sounds are normal. He exhibits no distension, no ascites and no mass. There is no hepatosplenomegaly. There is no tenderness. There is no rebound, no guarding and no CVA tenderness.  Musculoskeletal: Normal range of motion. He exhibits no edema or tenderness.  Lymphadenopathy:    He has no cervical adenopathy.  Neurological: He is alert and oriented to person, place, and time. He has normal strength. No cranial nerve deficit or sensory deficit.  Skin: Skin is warm, dry and intact. No petechiae and no rash noted. He is not diaphoretic. No erythema. No pallor.  Psychiatric:  Clinically intoxicated  Nursing note and vitals reviewed.   ED Course  Procedures (including critical care time) Labs Review Labs Reviewed  ETHANOL - Abnormal; Notable for the following:    Alcohol, Ethyl (B) 99 (*)    All other  components within normal limits  CBC  COMPREHENSIVE METABOLIC PANEL  URINE RAPID DRUG SCREEN, HOSP PERFORMED    Imaging Review No results found. I have personally reviewed and evaluated these images and lab results as part of my medical decision-making.   EKG Interpretation None      MDM   Final diagnoses:  Depression  Alcohol dependence with uncomplicated withdrawal [facility])   [facility].  He currently does not have SI or HI.  He presents to the ED often with nonspecific complaints and at times saying he is suicidal.  I do not believe TTS needs to be consulted on this patient again.  He was given outpatient resources, educated on alcohol cessation, and advised to also seek a PCP for further care.  He was initially upset that we could not transfer him another facility.  He was seen ambulating out of the ED in no distress.   Tomasita Crumble, MD 10/20/15 [phone]

## 2015-10-30 ENCOUNTER — Emergency Department (HOSPITAL_COMMUNITY)
Admission: EM | Admit: 2015-10-30 | Discharge: 2015-10-31 | Disposition: A | Discharge status: Home / Self Care | Payer: Self-pay | Attending: Emergency Medicine | Admitting: Emergency Medicine

## 2015-10-30 ENCOUNTER — Encounter (HOSPITAL_COMMUNITY): Payer: Self-pay | Admitting: Emergency Medicine

## 2015-10-30 DIAGNOSIS — F1210 Cannabis abuse, uncomplicated: Secondary | ICD-10-CM | POA: Insufficient documentation

## 2015-10-30 DIAGNOSIS — F251 Schizoaffective disorder, depressive type: Secondary | ICD-10-CM | POA: Insufficient documentation

## 2015-10-30 DIAGNOSIS — F17210 Nicotine dependence, cigarettes, uncomplicated: Secondary | ICD-10-CM | POA: Insufficient documentation

## 2015-10-30 DIAGNOSIS — F1914 Other psychoactive substance abuse with psychoactive substance-induced mood disorder: Secondary | ICD-10-CM | POA: Insufficient documentation

## 2015-10-30 DIAGNOSIS — F1994 Other psychoactive substance use, unspecified with psychoactive substance-induced mood disorder: Secondary | ICD-10-CM

## 2015-10-30 DIAGNOSIS — F32A Depression, unspecified: Secondary | ICD-10-CM

## 2015-10-30 DIAGNOSIS — Z880 Allergy status to penicillin: Secondary | ICD-10-CM | POA: Insufficient documentation

## 2015-10-30 DIAGNOSIS — F329 Major depressive disorder, single episode, unspecified: Secondary | ICD-10-CM | POA: Insufficient documentation

## 2015-10-30 LAB — COMPREHENSIVE METABOLIC PANEL
ALBUMIN: 4.4 g/dL (ref 3.5–5.0)
ALT: 14 U/L — ABNORMAL LOW (ref 17–63)
AST: 20 U/L (ref 15–41)
Alkaline Phosphatase: 65 U/L (ref 38–126)
Anion gap: 10 (ref 5–15)
BUN: 12 mg/dL (ref 6–20)
CHLORIDE: 104 mmol/L (ref 101–111)
CO2: 26 mmol/L (ref 22–32)
Calcium: 9.2 mg/dL (ref 8.9–10.3)
Creatinine, Ser: 0.95 mg/dL (ref 0.61–1.24)
GFR calc Af Amer: >60 mL/min (ref >60)
Glucose, Bld: 131 mg/dL — ABNORMAL HIGH (ref 65–99)
POTASSIUM: 4.2 mmol/L (ref 3.5–5.1)
SODIUM: 140 mmol/L (ref 135–145)
Total Bilirubin: 0.4 mg/dL (ref 0.3–1.2)
Total Protein: 6.6 g/dL (ref 6.5–8.1)

## 2015-10-30 LAB — CBC
HCT: 43.7 % (ref 39.0–52.0)
HEMOGLOBIN: 15 g/dL (ref 13.0–17.0)
MCH: 30.4 pg (ref 26.0–34.0)
MCHC: 34.3 g/dL (ref 30.0–36.0)
MCV: 88.5 fL (ref 78.0–100.0)
PLATELETS: 240 10*3/uL (ref 150–400)
RBC: 4.94 MIL/uL (ref 4.22–5.81)
RDW: 12.7 % (ref 11.5–15.5)
WBC: 6.8 10*3/uL (ref 4.0–10.5)

## 2015-10-30 LAB — RAPID URINE DRUG SCREEN, HOSP PERFORMED
AMPHETAMINES: NOT DETECTED
Barbiturates: NOT DETECTED
Benzodiazepines: NOT DETECTED
Cocaine: NOT DETECTED
OPIATES: NOT DETECTED
Tetrahydrocannabinol: POSITIVE — AB

## 2015-10-30 LAB — ETHANOL

## 2015-10-30 LAB — ACETAMINOPHEN LEVEL: Acetaminophen (Tylenol), Serum: <10 ug/mL — ABNORMAL LOW (ref 10–30)

## 2015-10-30 LAB — SALICYLATE LEVEL: Salicylate Lvl: <4 mg/dL (ref 2.8–30.0)

## 2015-10-30 NOTE — ED Notes (Addendum)
Pt reports that he "has been on a heroin diet....lost 20 lb in two weeks." requested a sandwich.  Informed RN that he "does not like nurses...or telling his story."  Provider came in and he requested to speak to her alone.  RN left but kept the door open.

## 2015-10-30 NOTE — ED Notes (Signed)
Pt placed in gown and staffing called for sitter, Minerva Areola in staffing states no sitters available.

## 2015-10-30 NOTE — ED Notes (Signed)
TTS machine at bedside. 

## 2015-10-30 NOTE — ED Provider Notes (Signed)
CSN: [id number]     Arrival date & time 10/30/15  1903 History   First MD Initiated Contact with Patient 10/30/15 1939     Chief Complaint  Patient presents with  . Depression     (Consider location/radiation/quality/duration/timing/severity/associated sxs/prior Treatment) The history is provided by the patient and medical records. No language interpreter was used.    Marc Hart is a 25 y.o. male  with a hx of schizoaffective disorder, substance abuse, alcohol abuse presents to the Emergency Department stating "I came here because I didn't want to do something stupid tonight."  Patient is unwilling to answer questions about suicidal ideation, homicidal ideation, auditory or visual hallucinations. He reports that his best friend was killed in a motorcycle accident and died yesterday.  He reports worsening symptoms of depression.  Patient reports to RN that he has been using heroin however he denies this to me stating only alcohol abuse in the last several days.  Denies taking any medications.  Level 5 caveat for psychiatric disorder.   Past Medical History  Diagnosis Date  . Schizophrenia (HCC)   . Substance abuse    History reviewed. No pertinent past surgical history. No family history on file. Social History  Substance Use Topics  . Smoking status: Current Some Day Smoker -- 0.75 packs/day    Types: Cigarettes  . Smokeless tobacco: Never Used  . Alcohol Use: Yes    Review of Systems  Unable to perform ROS: Psychiatric disorder  Psychiatric/Behavioral: Positive for depression.      Allergies  Amoxicillin; Ibuprofen; Peanuts; Other; and Tylenol  Home Medications   Prior to Admission medications   Not on File   BP 140/80 mmHg  Pulse 74  Temp(Src) 98 F (36.7 C) (Oral)  Resp 16  SpO2 98% Physical Exam  Constitutional: He appears well-developed and well-nourished. No distress.  Awake, alert, nontoxic appearance  HENT:  Head: Normocephalic and atraumatic.   Mouth/Throat: Oropharynx is clear and moist. No oropharyngeal exudate.  Eyes: Conjunctivae are normal. No scleral icterus.  Neck: Normal range of motion. Neck supple.  Cardiovascular: Normal rate, regular rhythm and intact distal pulses.   Pulmonary/Chest: Effort normal and breath sounds normal. No respiratory distress. He has no wheezes.  Equal chest expansion  Abdominal: Soft. Bowel sounds are normal. He exhibits no mass. There is no tenderness. There is no rebound and no guarding.  Musculoskeletal: Normal range of motion. He exhibits no edema.  Neurological: He is alert.  Speech is clear and goal oriented Moves extremities without ataxia  Skin: Skin is warm and dry. He is not diaphoretic.  Psychiatric: His speech is normal. His affect is labile. He is hyperactive.  Nursing note and vitals reviewed.   ED Course  Procedures (including critical care time) Labs Review Labs Reviewed  COMPREHENSIVE METABOLIC PANEL - Abnormal; Notable for the following:    Glucose, Bld 131 (*)    ALT 14 (*)    All other components within normal limits  ACETAMINOPHEN LEVEL - Abnormal; Notable for the following:    Acetaminophen (Tylenol), Serum <10 (*)    All other components within normal limits  URINE RAPID DRUG SCREEN, HOSP PERFORMED - Abnormal; Notable for the following:    Tetrahydrocannabinol POSITIVE (*)    All other components within normal limits  ETHANOL  SALICYLATE LEVEL  CBC    MDM   Final diagnoses:  Substance induced mood disorder (HCC)  Depression  Schizoaffective disorder, depressive type (HCC)   Marc Hart presents  with complaints of depression.  Pt uncooperative with HPI.  Review shows him with multiple ED visits over the last 6 months and multiple TTS evaluations without any recent hospitalizations.   11:25 PM Labs are reassuring. Patient is medically cleared. Awaiting TTS evaluation.  The patient was discussed with and seen by Dr. Ethelda Chick who agrees with the  treatment plan.   Dahlia Client Murray Guzzetta, PA-C 10/30/15 2325  Doug Sou, MD 10/30/15 [phone]
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
## 2015-10-30 NOTE — ED Notes (Signed)
Pt from home for eval of depression x2 days, pt states best friend passed away yesterday and ever since has been depressed. Reports HI- at this time will not elaborate. Pt calm and cooperative in triage. Nad noted. Denies any substance abuse.

## 2015-10-31 DIAGNOSIS — F329 Major depressive disorder, single episode, unspecified: Secondary | ICD-10-CM

## 2015-10-31 NOTE — ED Notes (Signed)
Pt. Asking for pain medication at this time. Pt. Advised there is no pain medication ordered for him. Pt. Updated on plan of care for today.
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
## 2015-10-31 NOTE — ED Notes (Signed)
Pt. Up walking in hall way now with sitter present.

## 2015-10-31 NOTE — ED Notes (Signed)
Patient was given a snack and drink, A regular diet ordered for lunch. 

## 2015-10-31 NOTE — ED Notes (Signed)
Patient had 2 belongings bags placed on 4th rack in soiled utility room.

## 2015-10-31 NOTE — ED Notes (Signed)
Pt. Lunch Tray arrived at this time.

## 2015-10-31 NOTE — ED Provider Notes (Signed)
Patient was assessed by TTS team and deemed appropriate for discharge. Pt ambulating at baseline, no acute distress on exam, and otherwise medically clear. Plan to follow up as needed and return precautions discussed for worsening or new concerning symptoms.    Lyndal Pulley, MD 10/31/15 [phone]

## 2015-10-31 NOTE — Discharge Instructions (Signed)
Anger Management °Anger is a normal human emotion. However, anger can range from mild irritation to rage. When your anger becomes harmful to yourself or others, it is unhealthy anger.  °CAUSES  °There are many reasons for unhealthy anger. Many people learn how to express anger from observing how their family expressed anger. In troubled, chaotic, or abusive families, anger can be expressed as rage or even violence. Children can grow up never learning how healthy anger can be expressed. Factors that contribute to unhealthy anger include:  °· Drug or alcohol abuse. °· Post-traumatic stress disorder. °· Traumatic brain injury. °COMPLICATIONS  °People with unhealthy anger tend to overreact and retaliate against a real or imagined threat. The need to retaliate can turn into violence or verbal abuse against another person. Chronic anger can lead to health problems, such as hypertension, high blood pressure, and depression. °TREATMENT  °Exercising, relaxing, meditating, or writing out your feelings all can be beneficial in managing moderate anger. For unhealthy anger, the following methods may be used: °· Cognitive-behavioral counseling (learning skills to change the thoughts that influence your mood). °· Relaxation training. °· Interpersonal counseling. °· Assertive communication skills. °· Medication. °  °This information is not intended to replace advice given to you by your health care provider. Make sure you discuss any questions you have with your health care provider. °  °Document Released: 06/17/2007 Document Revised: 11/12/2011 Document Reviewed: 10/26/2010 °[employer]vier Interactive Patient Education ©2016 [employer] ° °

## 2015-10-31 NOTE — Consult Note (Signed)
Telepsych Consultation   Reason for Consult:  Depression and suicidal ideation Referring Physician:  EDP Patient Identification: [place] MRN:  [id number] Principal Diagnosis: <principal problem not specified> Diagnosis:   Patient Active Problem List   Diagnosis Date Noted  . Depression [F32.9]   . Polysubstance abuse [F19.10]   . Substance induced mood disorder ([place]) [F19.94] 09/25/2015  . Alcohol dependence with uncomplicated withdrawal ([place]) [F10.230] 09/16/2015  . Alcohol-induced mood disorder ([place]) [F10.94] 09/16/2015  . Depressive disorder [F32.9] 09/12/2015  . History of schizophrenia [Z86.59]   . Schizoaffective disorder ([place]) [F25.9] 12/18/2014    Total Time spent with patient: 30 minutes  Subjective:   Marc Hart is a 25 y.o. male patient presented to [facility] ED with complaints of acute stressor and suicidal ideation without a plan. He reports that his friend died a few days ago in a motorcycle accident. Patient states "I'm tired of answering the same questions on the stupid machine. I need to get back out in the community to serve it. You could help by giving me a prescription for xanax."   HPI:  Marc Hart is a 25 y.o. male patient in [facility] ED. During this assessment patient is alert/oriented x4, but very difficult to engage in the assessment. Patient is irritable, agitated, and argumentative during assessment.  Patient denies suicidal ideation, homicidal ideation, psychosis, and paranoia. He expressed resentment over being answered routine assessment questions. Patient often gave irrelevant responses to questions. He was very sarcastic towards this Probation officer stating "Your the professional. You tell me." He was very guarded about providing any information about psychiatric symptoms. [place] did not appear to be depressed or psychotic. It appeared that the patient was hoping to obtain a prescription for xanax. He demonstrated bizarre movements such as using his  fingers to cut the air which appeared to be intentionally disruptive to the assessment.  Patient states that he is not currently taking any psychotropic medications. Patient was recently discharged from the Observation Unit on 09/12/2015 when he was referred to the [facility] to help with housing issues. Review of the note written by this writer indicates that he was also resistant to sharing pertinent information at that time. Patient does not appear to be a danger to himself or others. He was also seen by tele-assessment by a colleague in early February at which time the patient was also discharged. His current urine drug screen is positive for marijuana. Nursing staff report that the patient was asking for narcotic pain medications this morning.    Past Psychiatric History: Substance Induced Mood Disorder; history of [facility] OBS on 09/10/15 with substance abuse for ETOH/THC. No psychotropic medications  Risk to Self: Suicidal Ideation: No Suicidal Intent: No Is patient at risk for suicide?: No Suicidal Plan?: No Access to Means: No What has been your use of drugs/alcohol within the last 12 months?: Pt denies drug use (chart notes Cannabis & Cocaine). Pt reports consuming 2 beers/wk How many times?: 0 Other Self Harm Risks: no Intentional Self Injurious Behavior: None Risk to Others: Homicidal Ideation: No Thoughts of Harm to Others: No Current Homicidal Intent: No Current Homicidal Plan: No History of harm to others?: Yes (Per chart) Assessment of Violence: In past 6-12 months (Per Chart) Violent Behavior Description: h/o getting into fights per chart Does patient have access to weapons?: No Criminal Charges Pending?: No Does patient have a court date: No Prior Inpatient Therapy: Prior Inpatient Therapy: Yes Prior Therapy Dates: 01.17Multiple admits (Per Chart) Prior Therapy  Facilty/Provider(s): [facility] in [place] (Per Chart) Reason for Treatment: Depression, substance abuse (Per Chart) Prior  Outpatient Therapy: Prior Outpatient Therapy: No Does patient have an ACCT team?: Unknown (UTA due to pt reticence) Does patient have Intensive In-House Services?  : No Does patient have [facility] services? : Unknown (UTA due to pt reticence) Does patient have P4CC services?: Unknown (UTA due to pt reticence)  Past Medical History:  Past Medical History  Diagnosis Date  . Schizophrenia ([place])   . Substance abuse    History reviewed. No pertinent past surgical history. Family History: No family history on file. Family Psychiatric  History: None reported Social History:  History  Alcohol Use  . Yes     History  Drug Use  . Yes  . Special: Marijuana, IV    Comment: heroin    Social History   Social History  . Marital Status: Single    Spouse Name: N/A  . Number of Children: N/A  . Years of Education: N/A   Social History Main Topics  . Smoking status: Current Some Day Smoker -- 0.75 packs/day    Types: Cigarettes  . Smokeless tobacco: Never Used  . Alcohol Use: Yes  . Drug Use: Yes    Special: Marijuana, IV     Comment: heroin  . Sexual Activity: Not Asked   Other Topics Concern  . None   Social History Narrative   Additional Social History:    Allergies:   Allergies  Allergen Reactions  . Amoxicillin Anaphylaxis    Has patient had a PCN reaction causing immediate rash, facial/tongue/throat swelling, SOB or lightheadedness with hypotension: Yes Has patient had a PCN reaction causing severe rash involving mucus membranes or skin necrosis: No Has patient had a PCN reaction that required hospitalization No Has patient had a PCN reaction occurring within the last 10 years: Yes If all of the above answers are "NO", then may proceed with Cephalosporin use.  . Ibuprofen Other (See Comments)    Messes with his chemicals  . Peanuts [Peanut Oil] Other (See Comments)    "upsets stomach"  . Other Rash    Allergic reaction to coppertone suntan lotion  . Tylenol  [Acetaminophen] Itching    Labs:  Results for orders placed or performed during the hospital encounter of 10/30/15 (from the past 48 hour(s))  Comprehensive metabolic panel     Status: Abnormal   Collection Time: 10/30/15  9:05 PM  Result Value Ref Range   Sodium 140 135 - 145 mmol/L   Potassium 4.2 3.5 - 5.1 mmol/L   Chloride 104 101 - 111 mmol/L   CO2 26 22 - 32 mmol/L   Glucose, Bld 131 (H) 65 - 99 mg/dL   BUN 12 6 - 20 mg/dL   Creatinine, Ser 0.95 0.61 - 1.24 mg/dL   Calcium 9.2 8.9 - 10.3 mg/dL   Total Protein 6.6 6.5 - 8.1 g/dL   Albumin 4.4 3.5 - 5.0 g/dL   AST 20 15 - 41 U/L   ALT 14 (L) 17 - 63 U/L   Alkaline Phosphatase 65 38 - 126 U/L   Total Bilirubin 0.4 0.3 - 1.2 mg/dL   GFR calc non Af Amer >60 >60 mL/min   GFR calc Af Amer >60 >60 mL/min    Comment: (NOTE) The eGFR has been calculated using the CKD EPI equation. This calculation has not been validated in all clinical situations. eGFR's persistently <60 mL/min signify possible Chronic Kidney Disease.    Anion  gap 10 5 - 15  Ethanol (ETOH)     Status: None   Collection Time: 10/30/15  9:05 PM  Result Value Ref Range   Alcohol, Ethyl (B) <5 <5 mg/dL    Comment:        LOWEST DETECTABLE LIMIT FOR SERUM ALCOHOL IS 5 mg/dL FOR MEDICAL PURPOSES ONLY   Salicylate level     Status: None   Collection Time: 10/30/15  9:05 PM  Result Value Ref Range   Salicylate Lvl <5.4 2.8 - 30.0 mg/dL  Acetaminophen level     Status: Abnormal   Collection Time: 10/30/15  9:05 PM  Result Value Ref Range   Acetaminophen (Tylenol), Serum <10 (L) 10 - 30 ug/mL    Comment:        THERAPEUTIC CONCENTRATIONS VARY SIGNIFICANTLY. A RANGE OF 10-30 ug/mL MAY BE AN EFFECTIVE CONCENTRATION FOR MANY PATIENTS. HOWEVER, SOME ARE BEST TREATED AT CONCENTRATIONS OUTSIDE THIS RANGE. ACETAMINOPHEN CONCENTRATIONS >150 ug/mL AT 4 HOURS AFTER INGESTION AND >50 ug/mL AT 12 HOURS AFTER INGESTION ARE OFTEN ASSOCIATED WITH TOXIC REACTIONS.    CBC     Status: None   Collection Time: 10/30/15  9:05 PM  Result Value Ref Range   WBC 6.8 4.0 - 10.5 K/uL   RBC 4.94 4.22 - 5.81 MIL/uL   Hemoglobin 15.0 13.0 - 17.0 g/dL   HCT 43.7 39.0 - 52.0 %   MCV 88.5 78.0 - 100.0 fL   MCH 30.4 26.0 - 34.0 pg   MCHC 34.3 30.0 - 36.0 g/dL   RDW 12.7 11.5 - 15.5 %   Platelets 240 150 - 400 K/uL  Urine rapid drug screen (hosp performed) (Not at [facility])     Status: Abnormal   Collection Time: 10/30/15 10:24 PM  Result Value Ref Range   Opiates NONE DETECTED NONE DETECTED   Cocaine NONE DETECTED NONE DETECTED   Benzodiazepines NONE DETECTED NONE DETECTED   Amphetamines NONE DETECTED NONE DETECTED   Tetrahydrocannabinol POSITIVE (A) NONE DETECTED   Barbiturates NONE DETECTED NONE DETECTED    Comment:        DRUG SCREEN FOR MEDICAL PURPOSES ONLY.  IF CONFIRMATION IS NEEDED FOR ANY PURPOSE, NOTIFY LAB WITHIN 5 DAYS.        LOWEST DETECTABLE LIMITS FOR URINE DRUG SCREEN Drug Class       Cutoff (ng/mL) Amphetamine      1000 Barbiturate      200 Benzodiazepine   270 Tricyclics       623 Opiates          300 Cocaine          300 THC              50     No current facility-administered medications for this encounter.   No current outpatient prescriptions on file.    Musculoskeletal: Strength & Muscle Tone: within normal limits Gait & Station: Patient sitting on bed; did not see ambulate Patient leans: N/A  Psychiatric Specialty Exam: Review of Systems  Psychiatric/Behavioral: Positive for substance abuse. Negative for depression (Denies), suicidal ideas (Denies), hallucinations (Denies) and memory loss. The patient is not nervous/anxious (Denies) and does not have insomnia (Denies).   All other systems reviewed and are negative.   Blood pressure 109/52, pulse 46, temperature 98.1 F (36.7 C), temperature source Oral, resp. rate 16, SpO2 97 %.There is no weight on file to calculate BMI.  General Appearance: Disheveled  Eye  Contact::  Good  Speech:  Clear  and Coherent and Normal Rate  Volume:  Increased  Mood:  Irritable and Argumentative   Affect:  Labile  Thought Process:  Linear  Orientation:  Full (Time, Place, and Person)  Thought Content:  Denies hallucinations, delusions, and paranoia  Suicidal Thoughts:  No  Homicidal Thoughts:  No  Memory:  Immediate;   Good Recent;   Good Remote;   Good  Judgement:  Intact  Insight:  Fair  Psychomotor Activity:  Normal  Concentration:  Fair  Recall:  Good  Fund of Knowledge:Good  Language: Good  Akathisia:  No  Handed:  Right  AIMS (if indicated):     Assets:  Communication Skills Desire for Improvement Physical Health  ADL's:  Intact  Cognition: WNL  Sleep:      Treatment Plan Summary: Plan EDP may discharge when patient medical cleared.  Patient cleared by psych.  Patient to follow up with [facility].    Disposition: No evidence of imminent risk to self or others at present.   Patient does not meet criteria for psychiatric inpatient admission. Follow up with Romie Minus, NP 10/31/2015 1:42 PM       I agree with assessment and plan Geralyn Flash A. Sabra Heck, M.D.

## 2015-10-31 NOTE — ED Notes (Signed)
TTS in pt.s room. 

## 2015-10-31 NOTE — ED Notes (Signed)
PT. Showering at this time

## 2015-10-31 NOTE — ED Notes (Signed)
TTS machine in room ?

## 2015-11-18 ENCOUNTER — Emergency Department (HOSPITAL_COMMUNITY)
Admission: EM | Admit: 2015-11-18 | Discharge: 2015-11-18 | Disposition: A | Discharge status: Home / Self Care | Payer: Self-pay | Attending: Emergency Medicine | Admitting: Emergency Medicine

## 2015-11-18 ENCOUNTER — Encounter (HOSPITAL_COMMUNITY): Payer: Self-pay

## 2015-11-18 DIAGNOSIS — R109 Unspecified abdominal pain: Secondary | ICD-10-CM | POA: Insufficient documentation

## 2015-11-18 DIAGNOSIS — F17210 Nicotine dependence, cigarettes, uncomplicated: Secondary | ICD-10-CM | POA: Insufficient documentation

## 2015-11-18 DIAGNOSIS — R440 Auditory hallucinations: Secondary | ICD-10-CM | POA: Insufficient documentation

## 2015-11-18 DIAGNOSIS — Z880 Allergy status to penicillin: Secondary | ICD-10-CM | POA: Insufficient documentation

## 2015-11-18 DIAGNOSIS — Z5900 Homelessness unspecified: Secondary | ICD-10-CM

## 2015-11-18 DIAGNOSIS — Z8659 Personal history of other mental and behavioral disorders: Secondary | ICD-10-CM | POA: Insufficient documentation

## 2015-11-18 LAB — COMPREHENSIVE METABOLIC PANEL
ALBUMIN: 4.4 g/dL (ref 3.5–5.0)
ALT: 15 U/L — ABNORMAL LOW (ref 17–63)
ANION GAP: 12 (ref 5–15)
AST: 20 U/L (ref 15–41)
Alkaline Phosphatase: 74 U/L (ref 38–126)
BUN: 9 mg/dL (ref 6–20)
CHLORIDE: 105 mmol/L (ref 101–111)
CO2: 24 mmol/L (ref 22–32)
Calcium: 9.4 mg/dL (ref 8.9–10.3)
Creatinine, Ser: 1.04 mg/dL (ref 0.61–1.24)
GFR calc non Af Amer: >60 mL/min (ref >60)
Glucose, Bld: 90 mg/dL (ref 65–99)
Potassium: 3.9 mmol/L (ref 3.5–5.1)
SODIUM: 141 mmol/L (ref 135–145)
Total Bilirubin: 1.4 mg/dL — ABNORMAL HIGH (ref 0.3–1.2)
Total Protein: 7.1 g/dL (ref 6.5–8.1)

## 2015-11-18 LAB — URINALYSIS, ROUTINE W REFLEX MICROSCOPIC
GLUCOSE, UA: NEGATIVE mg/dL
HGB URINE DIPSTICK: NEGATIVE
Ketones, ur: 40 mg/dL — AB
Leukocytes, UA: NEGATIVE
Nitrite: NEGATIVE
PH: 5.5 (ref 5.0–8.0)
Protein, ur: 30 mg/dL — AB
SPECIFIC GRAVITY, URINE: 1.042 — AB (ref 1.005–1.030)

## 2015-11-18 LAB — CBC
HEMATOCRIT: 43.4 % (ref 39.0–52.0)
HEMOGLOBIN: 15.1 g/dL (ref 13.0–17.0)
MCH: 30.3 pg (ref 26.0–34.0)
MCHC: 34.8 g/dL (ref 30.0–36.0)
MCV: 87 fL (ref 78.0–100.0)
Platelets: 200 10*3/uL (ref 150–400)
RBC: 4.99 MIL/uL (ref 4.22–5.81)
RDW: 12.2 % (ref 11.5–15.5)
WBC: 7.2 10*3/uL (ref 4.0–10.5)

## 2015-11-18 LAB — URINE MICROSCOPIC-ADD ON: RBC / HPF: NONE SEEN RBC/hpf (ref 0–5)

## 2015-11-18 LAB — LIPASE, BLOOD: LIPASE: 20 U/L (ref 11–51)

## 2015-11-18 NOTE — ED Notes (Signed)
Pt states his stomach hurts and is having trouble sleeping. Denies N/V/D. He also reports HA. Pt appears very agitated and unwilling to provide more information regarding why he is here.

## 2015-11-18 NOTE — ED Provider Notes (Signed)
CSN: [id number]     Arrival date & time 11/18/15  1912 History   First MD Initiated Contact with Patient 11/18/15 2111     Chief Complaint  Patient presents with  . Abdominal Pain     (Consider location/radiation/quality/duration/timing/severity/associated sxs/prior Treatment) HPI Comments: Marc Hart is a 25 y.o. male with a PMHx of schizophrenia and substance abuse, who presents to the ED with complaints of abdominal pain. LEVEL 5 CAVEAT DUE TO PSYCHIATRIC CONDITION. Nursing note reports that he was complaining of abdominal pain when he was triaged, but would not give any specifics. Upon exam, he repeatedly states "I'm sick" and states that his abdomen hurts, but will not elaborate any further, and will not answer any questions regarding his abdominal pain. He repeatedly states that his "cold" because he slept under bridge and is homeless. When asked about auditory or visual hallucinations, he states that he hears a voice and asks whether I can hear it as well and his room, but he does not specifically state yes or no to any of my questions. He does not answer whether he is consuming any alcohol, he becomes very agitated during the exam and refuses to answer any questions. He denies SI and HI. Chart review reveals recent psychiatric observation stay on 10/30/15 but he left the following day after psych consultant felt he wasn't a danger to self/others and did not meet inpatient criteria.   Patient is a 25 y.o. male presenting with abdominal pain. The history is provided by the patient and medical records. The history is limited by the condition of the patient. No language interpreter was used.  Abdominal Pain Pain location:  Generalized Pain quality comment:  Unable to specify Pain severity:  Unable to specify Onset quality:  Unable to specify Timing:  Unable to specify Progression:  Unable to specify   Past Medical History  Diagnosis Date  . Schizophrenia ([facility])   . Substance abuse     History reviewed. No pertinent past surgical history. No family history on file. Social History  Substance Use Topics  . Smoking status: Current Some Day Smoker -- 0.75 packs/day    Types: Cigarettes  . Smokeless tobacco: Never Used  . Alcohol Use: Yes   LEVEL 5 CAVEAT DUE TO PSYCHIATRIC CONDITION Review of Systems  Unable to perform ROS: Psychiatric disorder  Gastrointestinal: Positive for abdominal pain.  Allergic/Immunologic: Negative for immunocompromised state.  Psychiatric/Behavioral: Positive for hallucinations. Negative for suicidal ideas.     Allergies  Amoxicillin; Ibuprofen; Peanuts; Other; and Tylenol  Home Medications   Prior to Admission medications   Not on File   BP 114/64 mmHg  Pulse 77  Temp(Src) 98.8 F (37.1 C) (Oral)  Resp 18  Wt 78.019 kg  SpO2 98% Physical Exam  Constitutional: He is oriented to person, place, and time. Vital signs are normal. He appears well-developed and well-nourished.  Non-toxic appearance. No distress.  Afebrile, nontoxic, NAD  HENT:  Head: Normocephalic and atraumatic.  Mouth/Throat: Oropharynx is clear and moist and mucous membranes are normal.  Eyes: Conjunctivae and EOM are normal. Right eye exhibits no discharge. Left eye exhibits no discharge.  Neck: Normal range of motion. Neck supple.  Cardiovascular: Normal rate, regular rhythm, normal heart sounds and intact distal pulses.  Exam reveals no gallop and no friction rub.   No murmur heard. Pulmonary/Chest: Effort normal and breath sounds normal. No respiratory distress. He has no decreased breath sounds. He has no wheezes. He has no rhonchi. He has  no rales.  Abdominal: Soft. Normal appearance and bowel sounds are normal. He exhibits no distension. There is no tenderness. There is no rigidity, no rebound, no guarding, no CVA tenderness, no tenderness at McBurney's point and negative Murphy's sign.  Soft, NTND, +BS throughout, no r/g/r, neg murphy's, neg mcburney's, no  CVA TTP   Musculoskeletal: Normal range of motion.  Neurological: He is alert and oriented to person, place, and time. He has normal strength. No sensory deficit.  Skin: Skin is warm, dry and intact. No rash noted.  Psychiatric: His affect is angry. He is actively hallucinating. He expresses no homicidal and no suicidal ideation. He expresses no suicidal plans and no homicidal plans.  Angry affect, cursing and getting angry during exam. Endorsing auditory hallucinations asking if I can hear the voices in his room. Denies SI/HI  Nursing note and vitals reviewed.   ED Course  Procedures (including critical care time) Labs Review Labs Reviewed  COMPREHENSIVE METABOLIC PANEL - Abnormal; Notable for the following:    ALT 15 (*)    Total Bilirubin 1.4 (*)    All other components within normal limits  URINALYSIS, ROUTINE W REFLEX MICROSCOPIC (NOT AT [facility]) - Abnormal; Notable for the following:    Color, Urine AMBER (*)    Specific Gravity, Urine 1.042 (*)    Bilirubin Urine SMALL (*)    Ketones, ur 40 (*)    Protein, ur 30 (*)    All other components within normal limits  URINE MICROSCOPIC-ADD ON - Abnormal; Notable for the following:    Squamous Epithelial / LPF 0-5 (*)    Bacteria, UA FEW (*)    All other components within normal limits  LIPASE, BLOOD  CBC    Imaging Review No results found. I have personally reviewed and evaluated these images and lab results as part of my medical decision-making.   EKG Interpretation None      MDM   Final diagnoses:  Abdominal pain, unspecified abdominal location  History of schizophrenia  Homelessness    25 y.o. male here for ?abd pain, although he will not tell me anything else, and just repeatedly states that he's "sick" and "was cold" which is why he came in. Won't answer any questions, but appears to be having auditory hallucinations asking if I can hear the voice that's speaking to him. Seems he's more of a psychiatric issue than  truly presenting for abd pain. Abdominal exam unremarkable. Labs showing lipase WNL, CMP with mildly elevated bili 1.4 likely from dehydration, CBC WNL, U/A with some ketones and protein likely from dehydration, no UTI or concerning findings. Will add-on UDS, salicylate, EtOH, and acetaminophen level. Will place sitter at bedside and get TTS consult. If he attempts to flee, will need to take out IVC paperwork. Once remaining labs return, will reassess  10:26 PM [place] with behavioral health feels that this is the same presentation/behavior he has exhibited in the past with his prior evaluations, does not meet inpatient criteria, and is denying everything to them, therefore they do not feel he is a threat to himself or others and does not need IVC paperwork. Will cancel add-on labs, especially since he's refusing to give these. When I discussed with pt that he can f/up with his outpatient team, he states he needs something for sleep-- discussed use of melatonin. Tylenol/motrin for pain. He then stated that he just wants food and then he will leave. Will give him something to eat and then d/c home, discussed  f/up with PCP in 1wk. I explained the diagnosis and have given explicit precautions to return to the ER including for any other new or worsening symptoms. The patient understands and accepts the medical plan as it's been dictated and I have answered their questions. Discharge instructions concerning home care and prescriptions have been given. The patient is STABLE and is discharged to home in good condition.  BP 114/64 mmHg  Pulse 77  Temp(Src) 98.8 F (37.1 C) (Oral)  Resp 18  Wt 78.019 kg  SpO2 98%  No orders of the defined types were placed in this encounter.     Guerry Covington Camprubi-Soms, PA-C 11/18/15 2234  Raeford RazorStephen Kohut, MD 11/23/15 1556
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
## 2015-11-18 NOTE — ED Notes (Signed)
Pt reports he is here "because I am sick"; RN asked him to elaborate and patient unwilling to provide more information; pt reports he "has been out in the freezing cold, I have no where to stay, I stayed under the bridge last night"; pt keeps repeating "I am sick"

## 2015-11-18 NOTE — Discharge Instructions (Signed)
You may use tylenol or motrin as needed for pain. Use melatonin as needed for sleep. Stay well hydrated with plenty of water. Follow up with your regular doctor in 1 week for recheck of symptoms. Return to the ER for changes or worsening symptoms.  Abdominal (belly) pain can be caused by many things. Your caregiver performed an examination and possibly ordered blood/urine tests and imaging (CT scan, x-rays, ultrasound). Many cases can be observed and treated at home after initial evaluation in the emergency department. Even though you are being discharged home, abdominal pain can be unpredictable. Therefore, you need a repeated exam if your pain does not resolve, returns, or worsens. Most patients with abdominal pain don't have to be admitted to the hospital or have surgery, but serious problems like appendicitis and gallbladder attacks can start out as nonspecific pain. Many abdominal conditions cannot be diagnosed in one visit, so follow-up evaluations are very important. SEEK IMMEDIATE MEDICAL ATTENTION IF YOU DEVELOP ANY OF THE FOLLOWING SYMPTOMS:  The pain does not go away or becomes severe.   A temperature above 101 develops.   Repeated vomiting occurs (multiple episodes).   The pain becomes localized to portions of the abdomen. The right side could possibly be appendicitis. In an adult, the left lower portion of the abdomen could be colitis or diverticulitis.   Blood is being passed in stools or vomit (bright red or black tarry stools).   Return also if you develop chest pain, difficulty breathing, dizziness or fainting, or become confused, poorly responsive, or inconsolable (young children).  The constipation stays for more than 4 days.   There is belly (abdominal) or rectal pain.   You do not seem to be getting better.     Abdominal Pain, Adult Many things can cause belly (abdominal) pain. Most times, the belly pain is not dangerous. Many cases of belly pain can be watched and  treated at home. HOME CARE   Do not take medicines that help you go poop (laxatives) unless told to by your doctor.  Only take medicine as told by your doctor.  Eat or drink as told by your doctor. Your doctor will tell you if you should be on a special diet. GET HELP IF:  You do not know what is causing your belly pain.  You have belly pain while you are sick to your stomach (nauseous) or have runny poop (diarrhea).  You have pain while you pee or poop.  Your belly pain wakes you up at night.  You have belly pain that gets worse or better when you eat.  You have belly pain that gets worse when you eat fatty foods.  You have a fever. GET HELP RIGHT AWAY IF:   The pain does not go away within 2 hours.  You keep throwing up (vomiting).  The pain changes and is only in the right or left part of the belly.  You have bloody or tarry looking poop. MAKE SURE YOU:   Understand these instructions.  Will watch your condition.  Will get help right away if you are not doing well or get worse.   This information is not intended to replace advice given to you by your health care provider. Make sure you discuss any questions you have with your health care provider.   Document Released: 02/06/2008 Document Revised: 09/10/2014 Document Reviewed: 04/29/2013 Elsevier Interactive Patient Education [employer] Elsevier Inc.

## 2015-11-18 NOTE — ED Notes (Signed)
Lab called to add on additional tests

## 2016-04-21 ENCOUNTER — Encounter (HOSPITAL_COMMUNITY): Payer: Self-pay | Admitting: *Deleted

## 2016-04-21 DIAGNOSIS — F17210 Nicotine dependence, cigarettes, uncomplicated: Secondary | ICD-10-CM | POA: Insufficient documentation

## 2016-04-21 DIAGNOSIS — F259 Schizoaffective disorder, unspecified: Secondary | ICD-10-CM | POA: Insufficient documentation

## 2016-04-21 DIAGNOSIS — F1020 Alcohol dependence, uncomplicated: Secondary | ICD-10-CM | POA: Insufficient documentation

## 2016-04-21 DIAGNOSIS — Z91010 Allergy to peanuts: Secondary | ICD-10-CM | POA: Insufficient documentation

## 2016-04-21 DIAGNOSIS — F329 Major depressive disorder, single episode, unspecified: Secondary | ICD-10-CM | POA: Insufficient documentation

## 2016-04-21 DIAGNOSIS — Z046 Encounter for general psychiatric examination, requested by authority: Secondary | ICD-10-CM | POA: Insufficient documentation

## 2016-04-21 NOTE — ED Triage Notes (Signed)
The pt is c/o abd pain after he drank alcohol last night.  The pt will barely answer questions asked.  He does not make eye contact when answering  Questions he admits to  Alcohol and drugs  The pt got up from triage chair and said he was leaving whne an attempt was made to draw his blood

## 2016-04-22 ENCOUNTER — Emergency Department (HOSPITAL_COMMUNITY)
Admission: EM | Admit: 2016-04-22 | Discharge: 2016-04-22 | Disposition: A | Discharge status: Home / Self Care | Payer: Self-pay | Attending: Emergency Medicine | Admitting: Emergency Medicine

## 2016-04-22 ENCOUNTER — Encounter (HOSPITAL_COMMUNITY): Payer: Self-pay | Admitting: Behavioral Health

## 2016-04-22 DIAGNOSIS — F251 Schizoaffective disorder, depressive type: Secondary | ICD-10-CM

## 2016-04-22 LAB — COMPREHENSIVE METABOLIC PANEL
ALK PHOS: 63 U/L (ref 38–126)
ALT: 22 U/L (ref 17–63)
ANION GAP: 7 (ref 5–15)
AST: 37 U/L (ref 15–41)
Albumin: 3.9 g/dL (ref 3.5–5.0)
BUN: 17 mg/dL (ref 6–20)
CALCIUM: 9 mg/dL (ref 8.9–10.3)
CO2: 27 mmol/L (ref 22–32)
Chloride: 107 mmol/L (ref 101–111)
Creatinine, Ser: 0.97 mg/dL (ref 0.61–1.24)
Glucose, Bld: 91 mg/dL (ref 65–99)
Potassium: 3.7 mmol/L (ref 3.5–5.1)
SODIUM: 141 mmol/L (ref 135–145)
TOTAL PROTEIN: 5.9 g/dL — AB (ref 6.5–8.1)
Total Bilirubin: 1.3 mg/dL — ABNORMAL HIGH (ref 0.3–1.2)

## 2016-04-22 LAB — CBC
HCT: 39.1 % (ref 39.0–52.0)
Hemoglobin: 13.6 g/dL (ref 13.0–17.0)
MCH: 30.4 pg (ref 26.0–34.0)
MCHC: 34.8 g/dL (ref 30.0–36.0)
MCV: 87.5 fL (ref 78.0–100.0)
PLATELETS: 172 10*3/uL (ref 150–400)
RBC: 4.47 MIL/uL (ref 4.22–5.81)
RDW: 12.4 % (ref 11.5–15.5)
WBC: 7 10*3/uL (ref 4.0–10.5)

## 2016-04-22 LAB — RAPID URINE DRUG SCREEN, HOSP PERFORMED
AMPHETAMINES: NOT DETECTED
BENZODIAZEPINES: NOT DETECTED
Barbiturates: NOT DETECTED
COCAINE: NOT DETECTED
OPIATES: NOT DETECTED
Tetrahydrocannabinol: NOT DETECTED

## 2016-04-22 LAB — ETHANOL

## 2016-04-22 MED ORDER — ZOLPIDEM TARTRATE 5 MG PO TABS: 5.0000 mg | ORAL_TABLET | Freq: Every evening | ORAL | Status: DC | PRN

## 2016-04-22 MED ORDER — NICOTINE 21 MG/24HR TD PT24
21.0000 mg | MEDICATED_PATCH | Freq: Every day | TRANSDERMAL | Status: DC
  Administered 2016-04-22: 21 mg via TRANSDERMAL
  Filled 2016-04-22: qty 1

## 2016-04-22 MED ORDER — ONDANSETRON HCL 4 MG PO TABS: 4.0000 mg | ORAL_TABLET | Freq: Three times a day (TID) | ORAL | Status: DC | PRN

## 2016-04-22 MED ORDER — ALUM & MAG HYDROXIDE-SIMETH 200-200-20 MG/5ML PO SUSP: 30.0000 mL | ORAL | Status: DC | PRN

## 2016-04-22 NOTE — ED Notes (Signed)
[facility] Irving BurtonEmily, [facility] - please discontinue TTS order and re-order when pt awakens.

## 2016-04-22 NOTE — ED Notes (Addendum)
Pt awakened to voice. Pt noted to be more pleasant. Bedside table moved closer to bed so pt may eat breakfast as requested.

## 2016-04-22 NOTE — [facility] Counselor (Signed)
[id number]:  Unable to completed tele-assessment at this time.  Patient is sleeping and will not wake up.

## 2016-04-22 NOTE — ED Notes (Signed)
TTS in progress and then pt will transfer to POD C. Report given to Marquita PalmsMario, [place]

## 2016-04-22 NOTE — ED Notes (Signed)
Pt changed into scrubs, belongings removed and wanded by security.  

## 2016-04-22 NOTE — ED Notes (Signed)
Pt belongings inventoried and signed for. One (1) bag placed in storage bin. No valuables to Security or medications to Pharmacy.

## 2016-04-22 NOTE — ED Notes (Signed)
Pt lying on bed w/eyes closed. Pt opened his eyes to voice. Stated inappropriate comment to RN then placed blanket over his head and refused to answer any questions. Aware breakfast tray on bedside table.
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
## 2016-04-22 NOTE — ED Notes (Signed)
MD at bedside. 
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
## 2016-04-22 NOTE — ED Notes (Signed)
Pt given snacks and water per request. Explained to him the snack time rules for Pod C, but allowed since he was just arriving here.

## 2016-04-22 NOTE — ED Notes (Signed)
Pt indicated need/desire to urinate upon being escorted to Pod C. Pt given sample container prior to entering bathroom, reminding him that we still needed a sample for testing. Pt exited with empty container, stating that he was unable to urinate, even though sounds of a flushing toilet were heard.

## 2016-04-22 NOTE — ED Notes (Signed)
Security escorted pt to lobby d/t pt refusing to get dressed. Pt had put on jeans and shoes. Wore scrub top out and carried his belongings bag and paperwork.

## 2016-04-22 NOTE — ED Notes (Signed)
TTS being performed.  

## 2016-04-24 ENCOUNTER — Encounter (HOSPITAL_COMMUNITY): Payer: Self-pay | Admitting: Emergency Medicine

## 2016-04-24 ENCOUNTER — Emergency Department (HOSPITAL_COMMUNITY): Admission: EM | Admit: 2016-04-24 | Discharge: 2016-04-24 | Discharge status: Against Medical Advice | Payer: Self-pay

## 2016-04-24 DIAGNOSIS — F17210 Nicotine dependence, cigarettes, uncomplicated: Secondary | ICD-10-CM | POA: Insufficient documentation

## 2016-04-24 DIAGNOSIS — Z5321 Procedure and treatment not carried out due to patient leaving prior to being seen by health care provider: Secondary | ICD-10-CM | POA: Insufficient documentation

## 2016-04-24 DIAGNOSIS — R1084 Generalized abdominal pain: Secondary | ICD-10-CM | POA: Insufficient documentation

## 2016-04-24 LAB — CBC
HCT: 37.8 % — ABNORMAL LOW (ref 39.0–52.0)
Hemoglobin: 12.9 g/dL — ABNORMAL LOW (ref 13.0–17.0)
MCH: 29.9 pg (ref 26.0–34.0)
MCHC: 34.1 g/dL (ref 30.0–36.0)
MCV: 87.5 fL (ref 78.0–100.0)
PLATELETS: 202 10*3/uL (ref 150–400)
RBC: 4.32 MIL/uL (ref 4.22–5.81)
RDW: 12.4 % (ref 11.5–15.5)
WBC: 8.7 10*3/uL (ref 4.0–10.5)

## 2016-04-24 LAB — URINALYSIS, ROUTINE W REFLEX MICROSCOPIC
Glucose, UA: NEGATIVE mg/dL
HGB URINE DIPSTICK: NEGATIVE
KETONES UR: 15 mg/dL — AB
Leukocytes, UA: NEGATIVE
NITRITE: NEGATIVE
Protein, ur: NEGATIVE mg/dL
SPECIFIC GRAVITY, URINE: 1.027 (ref 1.005–1.030)
pH: 6 (ref 5.0–8.0)

## 2016-04-24 LAB — COMPREHENSIVE METABOLIC PANEL
ALBUMIN: 3.9 g/dL (ref 3.5–5.0)
ALT: 16 U/L — ABNORMAL LOW (ref 17–63)
ANION GAP: 5 (ref 5–15)
AST: 23 U/L (ref 15–41)
Alkaline Phosphatase: 66 U/L (ref 38–126)
BILIRUBIN TOTAL: 0.8 mg/dL (ref 0.3–1.2)
BUN: 11 mg/dL (ref 6–20)
CALCIUM: 8.8 mg/dL — AB (ref 8.9–10.3)
CO2: 27 mmol/L (ref 22–32)
CREATININE: 1.15 mg/dL (ref 0.61–1.24)
Chloride: 107 mmol/L (ref 101–111)
GFR calc Af Amer: >60 mL/min (ref >60)
GLUCOSE: 99 mg/dL (ref 65–99)
Potassium: 3.4 mmol/L — ABNORMAL LOW (ref 3.5–5.1)
Sodium: 139 mmol/L (ref 135–145)
TOTAL PROTEIN: 5.7 g/dL — AB (ref 6.5–8.1)

## 2016-04-24 LAB — LIPASE, BLOOD: LIPASE: 20 U/L (ref 11–51)

## 2016-04-24 NOTE — ED Triage Notes (Signed)
Pt. reports generalized abdominal pain with emesis x1 this evening , denies diarrhea or fever .

## 2016-04-25 ENCOUNTER — Emergency Department (HOSPITAL_COMMUNITY)
Admission: EM | Admit: 2016-04-25 | Discharge: 2016-04-25 | Disposition: A | Discharge status: Home / Self Care | Payer: Self-pay | Attending: Emergency Medicine | Admitting: Emergency Medicine

## 2016-04-25 DIAGNOSIS — F17210 Nicotine dependence, cigarettes, uncomplicated: Secondary | ICD-10-CM | POA: Insufficient documentation

## 2016-04-25 DIAGNOSIS — Z91010 Allergy to peanuts: Secondary | ICD-10-CM | POA: Insufficient documentation

## 2016-04-25 DIAGNOSIS — R1084 Generalized abdominal pain: Secondary | ICD-10-CM | POA: Insufficient documentation

## 2016-04-25 NOTE — ED Notes (Signed)
Unable to locate

## 2016-04-25 NOTE — ED Triage Notes (Addendum)
Pt. reports intermittent generalized abdominal pain  For several days , denies emesis or diarrhea , poor historian / unable to give further details or focus during encounter , LWBS last night - blood tests / urine specimen collected last night .

## 2016-04-25 NOTE — ED Notes (Signed)
No answer when called for room assignment  

## 2016-04-26 ENCOUNTER — Emergency Department (HOSPITAL_COMMUNITY)
Admission: EM | Admit: 2016-04-26 | Discharge: 2016-04-26 | Disposition: A | Discharge status: Home / Self Care | Payer: Self-pay | Attending: Emergency Medicine | Admitting: Emergency Medicine

## 2016-04-26 DIAGNOSIS — R1084 Generalized abdominal pain: Secondary | ICD-10-CM

## 2016-04-26 NOTE — ED Provider Notes (Signed)
[facility]-EMER[facility]ENCY DEPT Provider Note   CSN: [id number] Arrival date & time: 04/25/16  2129     History   Chief Complaint Chief Complaint  Patient presents with  . Abdominal Pain    HPI Marc Hart is a 25 y.o. male.  Patient presents today with a chief complaint of abdominal pain.  He would not answer how long he has had abdominal pain.  He states " a long time."  He states that he is constipated.  However, he reports that he had a normal BM earlier today.  When asked if he had to strain with BM he states, "I don't know, I don't remember."  He denies vomiting, fever, chills, or any other symptoms.  He has not taken anything for symptoms prior to arrival.  He does have a history of Schizophrenia, Depression, and Polysubstance abuse.  He denies SI or HI at this time.  He was seen in the ED by TTS four days ago due to concern for acute psychosis.  It was determined that he did not meet inpatient criteria and he was instructed [facility].       Past Medical History:  Diagnosis Date  . Schizophrenia (HCC)   . Substance abuse     Patient Active Problem List   Diagnosis Date Noted  . Depression   . Polysubstance abuse   . Substance induced mood disorder (HCC) 09/25/2015  . Alcohol dependence with uncomplicated withdrawal (HCC) 09/16/2015  . 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 Depressive disorder 09/12/2015  . History of schizophrenia   . Schizoaffective disorder (HCC) 12/18/2014    No past surgical history on file.     Home Medications    Prior to Admission medications   Not on File    Family History No family history on file.  Social History Social History  Substance Use Topics  . Smoking status: Current Some Day Smoker    Types: Cigarettes  . Smokeless tobacco: Never Used  . Alcohol use Yes     Comment: Pt denied current use; no BAC at time of ax     Allergies   Amoxicillin; Ibuprofen; Peanuts [peanut oil];  Other; and Tylenol [acetaminophen]   Review of Systems Review of Systems  All other systems reviewed and are negative.    Physical Exam Updated Vital Signs BP 123/85   Pulse 85   Temp 98.7 F (37.1 C) (Oral)   Resp 18   Ht 5\' 10"  (1.778 m)   Wt 72.1 kg   SpO2 99%   BMI 22.81 kg/m   Physical Exam  Constitutional: He appears well-developed and well-nourished.  HENT:  Head: Normocephalic and atraumatic.  Mouth/Throat: Oropharynx is clear and moist.  Neck: Normal range of motion. Neck supple.  Cardiovascular: Normal rate, regular rhythm and normal heart sounds.   Pulmonary/Chest: Effort normal and breath sounds normal.  Abdominal: Soft. Bowel sounds are normal. He exhibits no distension and no mass. There is no tenderness. There is no rebound and no guarding. No hernia.  Musculoskeletal: Normal range of motion.  Neurological: He is alert.  Skin: Skin is warm and dry.  Nursing note and vitals reviewed.    ED Treatments / Results  Labs (all labs ordered are listed, but only abnormal results are displayed) Labs Reviewed - No data to display  EKG  EKG Interpretation None       Radiology No results found.  Procedures Procedures (including critical care time)  Medications Ordered in  ED Medications - No data to display   Initial Impression / Assessment and Plan / ED Course  I have reviewed the triage vital signs and the nursing notes.  Pertinent labs & imaging results that were available during my care of the patient were reviewed by me and considered in my medical decision making (see chart for details).  Clinical Course    Patient presents today with a chief complaint of abdominal pain that has been occurring "a long time."  He is unable to provide many details about the abdominal pain.  He does state that he is constipated, but then states that he had a normal BM earlier today.  Labs from yesterday were reviewed and were unremarkable.  On exam, he does have  tenderness to palpation of the abdomen.  He is requesting to eat.  His affect is very odd during this encounter. Suspect he is not taking his Schizophrenia medications.  However, he denies SI or HI.  Feel that the patient is stable for discharge.  Return precautions given.  Final Clinical Impressions(s) / ED Diagnoses   Final diagnoses:  None    New Prescriptions New Prescriptions   No medications on file     Santiago GladHeather Shawniece Oyola, PA-C 04/26/16 [phone]    Gilda Creasehristopher J Pollina, MD 04/27/16 [phone]

## 2016-05-10 ENCOUNTER — Encounter (HOSPITAL_COMMUNITY): Payer: Self-pay | Admitting: Emergency Medicine

## 2016-05-10 DIAGNOSIS — M79605 Pain in left leg: Secondary | ICD-10-CM | POA: Insufficient documentation

## 2016-05-10 DIAGNOSIS — Z5321 Procedure and treatment not carried out due to patient leaving prior to being seen by health care provider: Secondary | ICD-10-CM | POA: Insufficient documentation

## 2016-05-10 DIAGNOSIS — F17210 Nicotine dependence, cigarettes, uncomplicated: Secondary | ICD-10-CM | POA: Insufficient documentation
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
## 2016-05-11 ENCOUNTER — Emergency Department (HOSPITAL_COMMUNITY)
Admission: EM | Admit: 2016-05-11 | Discharge: 2016-05-11 | Disposition: A | Discharge status: Home / Self Care | Payer: Self-pay | Attending: Dermatology | Admitting: Dermatology

## 2016-05-11 NOTE — ED Notes (Signed)
Choosing to leave. Pt alert, NAD, calm, interactive, resps e/u, no dyspnea noted. Steady gait.

## 2016-05-26 ENCOUNTER — Emergency Department (HOSPITAL_COMMUNITY): Payer: Self-pay

## 2016-05-26 ENCOUNTER — Encounter (HOSPITAL_COMMUNITY): Payer: Self-pay | Admitting: Emergency Medicine

## 2016-05-26 ENCOUNTER — Emergency Department (HOSPITAL_COMMUNITY)
Admission: EM | Admit: 2016-05-26 | Discharge: 2016-05-26 | Disposition: A | Discharge status: Home / Self Care | Payer: Self-pay | Attending: Emergency Medicine | Admitting: Emergency Medicine

## 2016-05-26 DIAGNOSIS — F17210 Nicotine dependence, cigarettes, uncomplicated: Secondary | ICD-10-CM | POA: Insufficient documentation

## 2016-05-26 DIAGNOSIS — F10220 Alcohol dependence with intoxication, uncomplicated: Secondary | ICD-10-CM | POA: Insufficient documentation

## 2016-05-26 DIAGNOSIS — F10920 Alcohol use, unspecified with intoxication, uncomplicated: Secondary | ICD-10-CM

## 2016-05-26 DIAGNOSIS — R4182 Altered mental status, unspecified: Secondary | ICD-10-CM | POA: Insufficient documentation

## 2016-05-26 DIAGNOSIS — Z91010 Allergy to peanuts: Secondary | ICD-10-CM | POA: Insufficient documentation

## 2016-05-26 NOTE — ED Notes (Signed)
Pt ambulated without difficulty and with steady gait.  Misty StanleyLisa EDPA made aware

## 2016-05-26 NOTE — ED Notes (Signed)
He arouses easily and remains somewhat drowsy. He sits up and eats his entire breakfast without difficulty. He is in no distress.

## 2016-05-26 NOTE — ED Notes (Signed)
[facility] Expected date:  Expected time:  Means of arrival:  Comments: EMS 25yo M ETOH

## 2016-05-26 NOTE — ED Notes (Signed)
Bus pass and sandwich and drinks given to pt
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
## 2016-05-26 NOTE — ED Provider Notes (Signed)
Assumed care from PA of stool at shift change. See her note for full H&P. Briefly, [age] year old male here acutely intoxicated. History of same. Small laceration noted to the forehead which appears old.  Plan: CT head pending.  Sober and reassess.   Ct Head Wo Contrast  Result Date: 05/26/2016 CLINICAL DATA:  Small laceration to the right forehead. Alcohol intoxication. Altered mental status. EXAM: CT HEAD WITHOUT CONTRAST TECHNIQUE: Contiguous axial images were obtained from the base of the skull through the vertex without intravenous contrast. COMPARISON:  11/19/2007 FINDINGS: Brain: No evidence of acute infarction, hemorrhage, hydrocephalus, extra-axial collection or mass lesion/mass effect. Vascular: No hyperdense vessel or unexpected calcification. Skull: Normal. Negative for fracture or focal lesion. Sinuses/Orbits: No acute finding. Other: Small subcutaneous scalp hematoma over the right anterior frontal region. IMPRESSION: No acute intracranial abnormalities. No significant change since previous study. Electronically Signed   By: Burman NievesWilliam  Stevens M.D.   On: 05/26/2016 06:55    9:49 AM Patient ambulatory here in ED.  Eaten full meal, states he feels ok.  CT head negative.  Appears back to baseline.  D/c home with bus pass.   Garlon HatchetLisa M Sanders, PA-C 05/26/16 [phone]    Mancel BaleElliott Wentz, MD 05/26/16 [phone]

## 2016-05-26 NOTE — ED Notes (Addendum)
Was charting under pts name before assigned picked up in computer.   Pt would not wake up for v/s on first arrival.  breakfast  Ordered for pt, then told he will be d/c out most likely. Pt made aware.

## 2016-05-26 NOTE — ED Triage Notes (Signed)
Pt comes to ed via ems, homeless and ETOH. Small lac to forehead noted. V/s 150/90, pulse 90, rr 20, cbg 108.

## 2016-05-26 NOTE — ED Notes (Signed)
[facility]: [facility] Expected date:  Expected time:  Means of arrival:  Comments: 

## 2016-05-29 ENCOUNTER — Encounter (HOSPITAL_COMMUNITY): Payer: Self-pay | Admitting: Emergency Medicine

## 2016-05-29 ENCOUNTER — Emergency Department (HOSPITAL_COMMUNITY): Payer: Self-pay

## 2016-05-29 ENCOUNTER — Emergency Department (HOSPITAL_COMMUNITY)
Admission: EM | Admit: 2016-05-29 | Discharge: 2016-05-29 | Disposition: A | Discharge status: Home / Self Care | Payer: Self-pay | Attending: Emergency Medicine | Admitting: Emergency Medicine

## 2016-05-29 DIAGNOSIS — Z91010 Allergy to peanuts: Secondary | ICD-10-CM | POA: Insufficient documentation

## 2016-05-29 DIAGNOSIS — F17210 Nicotine dependence, cigarettes, uncomplicated: Secondary | ICD-10-CM | POA: Insufficient documentation

## 2016-05-29 DIAGNOSIS — S0990XA Unspecified injury of head, initial encounter: Secondary | ICD-10-CM | POA: Insufficient documentation

## 2016-05-29 DIAGNOSIS — Y939 Activity, unspecified: Secondary | ICD-10-CM | POA: Insufficient documentation

## 2016-05-29 DIAGNOSIS — Y999 Unspecified external cause status: Secondary | ICD-10-CM | POA: Insufficient documentation

## 2016-05-29 DIAGNOSIS — Y929 Unspecified place or not applicable: Secondary | ICD-10-CM | POA: Insufficient documentation

## 2016-05-29 MED ORDER — HYDROMORPHONE HCL 2 MG PO TABS
4.0000 mg | ORAL_TABLET | Freq: Once | ORAL | Status: AC
  Administered 2016-05-29: 4 mg via ORAL
  Filled 2016-05-29: qty 2
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
## 2016-05-29 NOTE — ED Notes (Signed)
Pt ambulated to restroom with no difficulty.

## 2016-05-29 NOTE — ED Notes (Signed)
Pt ambulated to restroom. 

## 2016-05-29 NOTE — Discharge Instructions (Signed)
Return for any new or worse symptoms. Today's head CT without any acute findings.
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
## 2016-05-29 NOTE — ED Notes (Signed)
Patient transported to CT 

## 2016-05-29 NOTE — ED Notes (Signed)
Patient requesting a prescription for pain medication upon discharge or another dose of pain medication prior to him leaving. This RN made patient aware that Dr Deretha EmoryZackowski advised he is not going to give patient anymore pain medication before leaving and that patient may take tylenol for pain as needed at home. Pt stated "this is a violation of my rights not getting any pain medicine." this RN advised patient that his pain was treated with PO Dilaudid during his visit here.

## 2016-12-17 ENCOUNTER — Emergency Department (HOSPITAL_COMMUNITY)
Admission: EM | Admit: 2016-12-17 | Discharge: 2016-12-17 | Disposition: A | Discharge status: Home / Self Care | Payer: Self-pay

## 2016-12-17 NOTE — ED Notes (Signed)
Patient refuses to be seen at this time.  Patient is aware of halflife of narcan vs half life of possible opiate usage.

## 2016-12-18 ENCOUNTER — Emergency Department (HOSPITAL_COMMUNITY)
Admission: EM | Admit: 2016-12-18 | Discharge: 2016-12-18 | Disposition: A | Discharge status: Home / Self Care | Payer: Self-pay | Attending: Emergency Medicine | Admitting: Emergency Medicine

## 2016-12-18 ENCOUNTER — Emergency Department (HOSPITAL_COMMUNITY): Payer: Self-pay

## 2016-12-18 ENCOUNTER — Encounter (HOSPITAL_COMMUNITY): Payer: Self-pay | Admitting: Emergency Medicine

## 2016-12-18 DIAGNOSIS — T401X1A Poisoning by heroin, accidental (unintentional), initial encounter: Secondary | ICD-10-CM

## 2016-12-18 DIAGNOSIS — Z91010 Allergy to peanuts: Secondary | ICD-10-CM | POA: Insufficient documentation

## 2016-12-18 DIAGNOSIS — F17210 Nicotine dependence, cigarettes, uncomplicated: Secondary | ICD-10-CM | POA: Insufficient documentation

## 2016-12-18 NOTE — ED Provider Notes (Signed)
[facility] Provider Note   CSN: [id number] Arrival date & time: 12/18/16  0141  By signing my name below, I, Nelwyn Salisbury, attest that this documentation has been prepared under the direction and in the presence of Tomasita Crumble, MD . Electronically Signed: Nelwyn Salisbury, Scribe. 12/18/2016. 3:38 AM.  History   Chief Complaint Chief Complaint  Patient presents with  . Shortness of Breath  . Abdominal Pain   The history is provided by the patient. No language interpreter was used.   HPI Comments:  Maycen Degregory Salce is a 26 y.o. male with pmhx of schizophrenia and substance abuse who presents to the Emergency Department after a sudden-onset seizure today. Pt notes that he was abusing heroine and cocaine when his seizure began and he blacked out due to an overdose. He states that he awoke to EMS administering Narcan, which alleviated his symptoms. When the pt arrived at the ED he denied care and returned home. He returns now because "god told him to" and to make sure that everything is okay as he is experiencing associated abdominal pain. No current modifying factors indicated. Denies any other symptoms.   Past Medical History:  Diagnosis Date  . Schizophrenia (HCC)   . Substance abuse     Patient Active Problem List   Diagnosis Date Noted  . Depression   . Polysubstance abuse   . Substance induced mood disorder (HCC) 09/25/2015  . Alcohol dependence with uncomplicated withdrawal (HCC) 09/16/2015  . 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 Depressive disorder 09/12/2015  . History of schizophrenia   . Schizoaffective disorder (HCC) 12/18/2014    History reviewed. No pertinent surgical history.     Home Medications    Prior to Admission medications   Not on File    Family History History reviewed. No pertinent family history.  Social History Social History  Substance Use Topics  . Smoking status: Current Every Day Smoker    Types: Cigarettes  .  Smokeless tobacco: Never Used  . Alcohol use Yes     Allergies   Amoxicillin; Ibuprofen; Peanuts [peanut oil]; Other; and Tylenol [acetaminophen]   Review of Systems Review of Systems All systems reviewed and are negative for acute change except as noted in the HPI.  Physical Exam Updated Vital Signs BP 125/75 (BP Location: Right Arm)   Pulse 86   Temp 97.9 F (36.6 C) (Oral)   Resp 18   Ht  (1.778 m)   Wt 175 lb (79.4 kg)   SpO2 97%   BMI 25.11 kg/m   Physical Exam  Constitutional: He is oriented to person, place, and time. Vital signs are normal. He appears well-developed and well-nourished.  Non-toxic appearance. He does not appear ill. No distress.  HENT:  Head: Normocephalic and atraumatic.  Nose: Nose normal.  Mouth/Throat: Oropharynx is clear and moist. No oropharyngeal exudate.  Eyes: Conjunctivae and EOM are normal. Pupils are equal, round, and reactive to light. No scleral icterus.  Neck: Normal range of motion. Neck supple. No tracheal deviation, no edema, no erythema and normal range of motion present. No thyroid mass and no thyromegaly present.  Cardiovascular: Normal rate, regular rhythm, S1 normal, S2 normal, normal heart sounds, intact distal pulses and normal pulses.  Exam reveals no gallop and no friction rub.   No murmur heard. Pulmonary/Chest: Effort normal and breath sounds normal. No respiratory distress. He has no wheezes. He has no rhonchi. He has no rales.  Abdominal: Soft. Normal appearance and bowel  sounds are normal. He exhibits no distension, no ascites and no mass. There is no hepatosplenomegaly. There is no tenderness. There is no rebound, no guarding and no CVA tenderness.  Musculoskeletal: Normal range of motion. He exhibits no edema or tenderness.  Lymphadenopathy:    He has no cervical adenopathy.  Neurological: He is alert and oriented to person, place, and time. He has normal strength. No cranial nerve deficit or sensory deficit.    Skin: Skin is warm, dry and intact. No petechiae and no rash noted. He is not diaphoretic. No erythema. No pallor.  Nursing note and vitals reviewed.    ED Treatments / Results  DIAGNOSTIC STUDIES:  Oxygen Saturation is 97% on RA, normal by my interpretation.    COORDINATION OF CARE:  3:49 AM Discussed treatment plan with pt at bedside which includes monitoring and pt agreed to plan.  Labs (all labs ordered are listed, but only abnormal results are displayed) Labs Reviewed - No data to display  EKG  EKG Interpretation None       Radiology Dg Chest 2 View  Result Date: 12/18/2016 CLINICAL DATA:  Shortness of breath EXAM: CHEST  2 VIEW COMPARISON:  07/16/2015 FINDINGS: The heart size and mediastinal contours are within normal limits. Both lungs are clear. The visualized skeletal structures are unremarkable. IMPRESSION: No active cardiopulmonary disease. Electronically Signed   By: Jasmine Pang M.D.   On: 12/18/2016 02:08    Procedures Procedures (including critical care time)  Medications Ordered in ED Medications - No data to display   Initial Impression / Assessment and Plan / ED Course  I have reviewed the triage vital signs and the nursing notes.  Pertinent labs & imaging results that were available during my care of the patient were reviewed by me and considered in my medical decision making (see chart for details).     Patient presents to the ED for heroin and cocaine use.  He was given narcan prior then left the ED by elopement without being seen by any provider. He states his friend talked him into coming back for evaluation. He did not use any further drugs and denies any new symptoms to me. VS are normal./ Patient observed in the ED and did not have any recurrence of his overdose.  He appears well and in NAD.  PCP fu advised.  Patient safe for DC.  Final Clinical Impressions(s) / ED Diagnoses   Final diagnoses:  None    New Prescriptions New  Prescriptions   No medications on file    I personally performed the services described in this documentation, which was scribed in my presence. The recorded information has been reviewed and is accurate.      Tomasita Crumble, MD 12/18/16 [phone]

## 2016-12-18 NOTE — ED Triage Notes (Signed)
Pt states he is having some sob tonight that early EMS brought him to the hospital for heroin overdose, pt c/o 10 abd pain, pt sitting comfortable NAD noticed.

## 2017-02-02 ENCOUNTER — Encounter (HOSPITAL_COMMUNITY): Payer: Self-pay

## 2017-02-02 DIAGNOSIS — R1084 Generalized abdominal pain: Secondary | ICD-10-CM | POA: Insufficient documentation

## 2017-02-02 DIAGNOSIS — M791 Myalgia: Secondary | ICD-10-CM | POA: Insufficient documentation

## 2017-02-02 DIAGNOSIS — Z5321 Procedure and treatment not carried out due to patient leaving prior to being seen by health care provider: Secondary | ICD-10-CM | POA: Insufficient documentation

## 2017-02-02 LAB — CBC
HCT: 44.3 % (ref 39.0–52.0)
HEMOGLOBIN: 15.8 g/dL (ref 13.0–17.0)
MCH: 30.9 pg (ref 26.0–34.0)
MCHC: 35.7 g/dL (ref 30.0–36.0)
MCV: 86.7 fL (ref 78.0–100.0)
Platelets: 215 10*3/uL (ref 150–400)
RBC: 5.11 MIL/uL (ref 4.22–5.81)
RDW: 12.9 % (ref 11.5–15.5)
WBC: 6.9 10*3/uL (ref 4.0–10.5)

## 2017-02-02 LAB — URINALYSIS, ROUTINE W REFLEX MICROSCOPIC
BILIRUBIN URINE: NEGATIVE
Glucose, UA: NEGATIVE mg/dL
Hgb urine dipstick: NEGATIVE
Ketones, ur: NEGATIVE mg/dL
LEUKOCYTES UA: NEGATIVE
NITRITE: NEGATIVE
Protein, ur: NEGATIVE mg/dL
SPECIFIC GRAVITY, URINE: 1.001 — AB (ref 1.005–1.030)
pH: 6 (ref 5.0–8.0)

## 2017-02-02 LAB — COMPREHENSIVE METABOLIC PANEL
ALK PHOS: 68 U/L (ref 38–126)
ALT: 20 U/L (ref 17–63)
ANION GAP: 8 (ref 5–15)
AST: 23 U/L (ref 15–41)
Albumin: 4.5 g/dL (ref 3.5–5.0)
BILIRUBIN TOTAL: 0.6 mg/dL (ref 0.3–1.2)
BUN: 11 mg/dL (ref 6–20)
CALCIUM: 9.6 mg/dL (ref 8.9–10.3)
CO2: 26 mmol/L (ref 22–32)
Chloride: 108 mmol/L (ref 101–111)
Creatinine, Ser: 1.06 mg/dL (ref 0.61–1.24)
GFR calc non Af Amer: >60 mL/min (ref >60)
Glucose, Bld: 124 mg/dL — ABNORMAL HIGH (ref 65–99)
Potassium: 3.8 mmol/L (ref 3.5–5.1)
Sodium: 142 mmol/L (ref 135–145)
TOTAL PROTEIN: 6.9 g/dL (ref 6.5–8.1)

## 2017-02-02 LAB — LIPASE, BLOOD: Lipase: 21 U/L (ref 11–51)

## 2017-02-02 NOTE — ED Triage Notes (Signed)
Pt complaining of generalized body aches and abdominal pain x 2 days. Pt denies any N/V/D. Pt denies any urinary symptoms.

## 2017-02-03 ENCOUNTER — Emergency Department (HOSPITAL_COMMUNITY)
Admission: EM | Admit: 2017-02-03 | Discharge: 2017-02-03 | Disposition: A | Discharge status: Home / Self Care | Payer: Self-pay | Attending: Emergency Medicine | Admitting: Emergency Medicine

## 2017-02-03 NOTE — ED Notes (Signed)
No answer X 3.

## 2017-05-02 ENCOUNTER — Encounter (HOSPITAL_COMMUNITY): Payer: Self-pay | Admitting: Emergency Medicine

## 2017-05-02 ENCOUNTER — Emergency Department (HOSPITAL_COMMUNITY)
Admission: EM | Admit: 2017-05-02 | Discharge: 2017-05-02 | Disposition: A | Discharge status: Home / Self Care | Payer: Self-pay | Attending: Emergency Medicine | Admitting: Emergency Medicine

## 2017-05-02 ENCOUNTER — Emergency Department (HOSPITAL_COMMUNITY): Payer: Self-pay

## 2017-05-02 DIAGNOSIS — R05 Cough: Secondary | ICD-10-CM | POA: Insufficient documentation

## 2017-05-02 DIAGNOSIS — Z5321 Procedure and treatment not carried out due to patient leaving prior to being seen by health care provider: Secondary | ICD-10-CM | POA: Insufficient documentation

## 2017-05-02 LAB — BASIC METABOLIC PANEL
ANION GAP: 7 (ref 5–15)
BUN: 14 mg/dL (ref 6–20)
CO2: 26 mmol/L (ref 22–32)
Calcium: 9 mg/dL (ref 8.9–10.3)
Chloride: 107 mmol/L (ref 101–111)
Creatinine, Ser: 1.08 mg/dL (ref 0.61–1.24)
GFR calc Af Amer: >60 mL/min (ref >60)
GLUCOSE: 94 mg/dL (ref 65–99)
POTASSIUM: 4 mmol/L (ref 3.5–5.1)
Sodium: 140 mmol/L (ref 135–145)

## 2017-05-02 LAB — CBC
HEMATOCRIT: 44.8 % (ref 39.0–52.0)
Hemoglobin: 15.3 g/dL (ref 13.0–17.0)
MCH: 29.8 pg (ref 26.0–34.0)
MCHC: 34.2 g/dL (ref 30.0–36.0)
MCV: 87.3 fL (ref 78.0–100.0)
Platelets: 245 10*3/uL (ref 150–400)
RBC: 5.13 MIL/uL (ref 4.22–5.81)
RDW: 12.7 % (ref 11.5–15.5)
WBC: 9.2 10*3/uL (ref 4.0–10.5)

## 2017-05-02 NOTE — ED Notes (Signed)
No answer for re-assessment.

## 2017-05-02 NOTE — ED Notes (Signed)
Pt.left without being   seen by the doctor

## 2017-05-02 NOTE — ED Triage Notes (Signed)
Pt reports cough x1 month, states increase in mucous production. Ibuprofen and zyrtec not effective. Denies SHOB and CP. Poor historian.

## 2017-05-02 NOTE — ED Notes (Signed)
Pt did not answer for ready room x3.

## 2017-08-23 ENCOUNTER — Encounter (HOSPITAL_COMMUNITY): Payer: Self-pay | Admitting: Emergency Medicine

## 2017-08-23 ENCOUNTER — Emergency Department (HOSPITAL_COMMUNITY)
Admission: EM | Admit: 2017-08-23 | Discharge: 2017-08-26 | Disposition: A | Discharge status: Home / Self Care | Payer: Self-pay | Attending: Emergency Medicine | Admitting: Emergency Medicine

## 2017-08-23 DIAGNOSIS — F209 Schizophrenia, unspecified: Secondary | ICD-10-CM | POA: Insufficient documentation

## 2017-08-23 DIAGNOSIS — F1910 Other psychoactive substance abuse, uncomplicated: Secondary | ICD-10-CM | POA: Insufficient documentation

## 2017-08-23 DIAGNOSIS — R4182 Altered mental status, unspecified: Secondary | ICD-10-CM | POA: Insufficient documentation

## 2017-08-23 LAB — CBC
HCT: 44.4 % (ref 39.0–52.0)
HEMOGLOBIN: 15.8 g/dL (ref 13.0–17.0)
MCH: 31.2 pg (ref 26.0–34.0)
MCHC: 35.6 g/dL (ref 30.0–36.0)
MCV: 87.6 fL (ref 78.0–100.0)
PLATELETS: 242 10*3/uL (ref 150–400)
RBC: 5.07 MIL/uL (ref 4.22–5.81)
RDW: 12.7 % (ref 11.5–15.5)
WBC: 11.1 10*3/uL — AB (ref 4.0–10.5)

## 2017-08-23 LAB — COMPREHENSIVE METABOLIC PANEL
ALT: 12 U/L — AB (ref 17–63)
ANION GAP: 9 (ref 5–15)
AST: 25 U/L (ref 15–41)
Albumin: 4.5 g/dL (ref 3.5–5.0)
Alkaline Phosphatase: 74 U/L (ref 38–126)
BUN: 10 mg/dL (ref 6–20)
CHLORIDE: 106 mmol/L (ref 101–111)
CO2: 24 mmol/L (ref 22–32)
CREATININE: 1.12 mg/dL (ref 0.61–1.24)
Calcium: 9.2 mg/dL (ref 8.9–10.3)
GFR calc non Af Amer: >60 mL/min (ref >60)
Glucose, Bld: 134 mg/dL — ABNORMAL HIGH (ref 65–99)
POTASSIUM: 3.6 mmol/L (ref 3.5–5.1)
SODIUM: 139 mmol/L (ref 135–145)
Total Bilirubin: 0.7 mg/dL (ref 0.3–1.2)
Total Protein: 7.1 g/dL (ref 6.5–8.1)

## 2017-08-23 LAB — SALICYLATE LEVEL

## 2017-08-23 LAB — ACETAMINOPHEN LEVEL

## 2017-08-23 LAB — ETHANOL: Alcohol, Ethyl (B): <10 mg/dL (ref <10)

## 2017-08-23 MED ORDER — STERILE WATER FOR INJECTION IJ SOLN
INTRAMUSCULAR | Status: AC
Start: 2017-08-23 — End: 2017-08-23
  Administered 2017-08-23: 18:00:00
  Filled 2017-08-23: qty 10

## 2017-08-23 MED ORDER — OLANZAPINE 5 MG PO TBDP
10.0000 mg | ORAL_TABLET | Freq: Three times a day (TID) | ORAL | Status: DC | PRN
  Administered 2017-08-24 – 2017-08-25 (×2): 10 mg via ORAL
  Filled 2017-08-23: qty 1
  Filled 2017-08-23 (×3): qty 2

## 2017-08-23 MED ORDER — ZIPRASIDONE MESYLATE 20 MG IM SOLR
20.0000 mg | Freq: Once | INTRAMUSCULAR | Status: AC
  Administered 2017-08-23: 20 mg via INTRAMUSCULAR
  Filled 2017-08-23: qty 20

## 2017-08-23 MED ORDER — STERILE WATER FOR INJECTION IJ SOLN
INTRAMUSCULAR | Status: AC
  Administered 2017-08-23: 18:00:00
  Filled 2017-08-23: qty 10

## 2017-08-23 MED ORDER — LORAZEPAM 1 MG PO TABS
1.0000 mg | ORAL_TABLET | ORAL | Status: AC | PRN
  Administered 2017-08-23: 1 mg via ORAL
  Filled 2017-08-23: qty 1

## 2017-08-23 MED ORDER — ZIPRASIDONE MESYLATE 20 MG IM SOLR
20.0000 mg | INTRAMUSCULAR | Status: AC | PRN
  Administered 2017-08-23: 20 mg via INTRAMUSCULAR
  Filled 2017-08-23: qty 20

## 2017-08-23 MED ORDER — ACETAMINOPHEN 325 MG PO TABS
650.0000 mg | ORAL_TABLET | ORAL | Status: DC | PRN
  Administered 2017-08-24: 650 mg via ORAL
  Filled 2017-08-23 (×2): qty 2

## 2017-08-23 MED ORDER — ONDANSETRON HCL 4 MG PO TABS: 4.0000 mg | ORAL_TABLET | Freq: Three times a day (TID) | ORAL | Status: DC | PRN

## 2017-08-23 MED ORDER — ZOLPIDEM TARTRATE 5 MG PO TABS
5.0000 mg | ORAL_TABLET | Freq: Every evening | ORAL | Status: DC | PRN
  Administered 2017-08-24 – 2017-08-25 (×2): 5 mg via ORAL
  Filled 2017-08-23 (×2): qty 1
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
## 2017-08-23 NOTE — ED Notes (Addendum)
Pt up to use bathroom. Pt ambulated w/ steady gait to bathroom and back to the bed
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
## 2017-08-23 NOTE — ED Notes (Signed)
TTS called. Told patient was sleeping and they stated they would call back.

## 2017-08-23 NOTE — ED Notes (Addendum)
Pt to be IVC'd. Refusing VS.

## 2017-08-23 NOTE — ED Notes (Signed)
Behavorial health called to do pts TTS. Pt unable to at this time. They will reassess in the am.

## 2017-08-23 NOTE — ED Notes (Signed)
Pt exposed penis outside of his room and while ambulating to the bathroom, pt informed this is not acceptable behavior and that security will be called if he continues to act inappropriately

## 2017-08-23 NOTE — ED Notes (Addendum)
Pt up for dc. In to dc pt and pt began to rant and yell. Sitter out of room, tired of pts behavior-calling her names and masturbating. Md to bedside. Pt states that he is going to leave and jump off of a bridge when he is dc'd. Pt talking about drugs, jesus, killing people, being the "savior", talking about how he is a vampire and he drinks blood. Marland Kitchen. Awaiting orders. Security to bedside.

## 2017-08-23 NOTE — ED Notes (Signed)
Regular diet breakfast tray ordered @ 0727 w/ 'no sharps' requested.

## 2017-08-23 NOTE — Progress Notes (Signed)
TTS attempted to contact the pt's nurse to inquire if the pt is still sleeping or if he is able to be assessed. Spoke with nurse secretary Irving BurtonEmily who attempted to connect TTS to the pt's nurse but did not receive an answer. Irving Burtonmily states no one is answering and she is unable to confirm if the pt is awake or still sleeping. TTS will attempt at a later time.   Marc Hart, MSW, LCSW Therapeutic Triage Specialist  [phone]

## 2017-08-23 NOTE — ED Notes (Signed)
Patient denies pain and is resting comfortably.  

## 2017-08-23 NOTE — ED Notes (Signed)
Pt continues to make sexual comments, pt redirected and informed that this is not acceptable communication, pt contracts to respect staff and sitter while here at the ED

## 2017-08-23 NOTE — ED Provider Notes (Signed)
[facility] Provider Note   CSN: [id number] Arrival date & time: 08/23/17  0147     History   Chief Complaint Chief Complaint  Patient presents with  . Overdose    HPI Marc MartinezLandon D Hart is a 26 y.o. male.  HPI 26 year old male comes in with chief complaint of overdose. Level 5 caveat for altered sensorium  Patient reports that he came to the ER because he is feeling unwell after taking drugs.  Initially patient informed me that he took marijuana prior to ER arrival started feeling unwell.  Thereafter he reported that he ingested marijuana for a 5 days ago, and he does not remember what he took prior to ER arrival.  Patient reports that he has history of substance abuse, and that he typically uses toxic mushrooms.  Patient denied any heroin, meth amphetamine, cocaine, alcohol use today.  Patient is not able to provide appropriate medical or psychiatric history.  History reviewed. No pertinent past medical history.  There are no active problems to display for this patient.   History reviewed. No pertinent surgical history.     Home Medications    Prior to Admission medications   Not on File    Family History No family history on file.  Social History Social History   Tobacco Use  . Smoking status: Never Smoker  . Smokeless tobacco: Never Used  Substance Use Topics  . Alcohol use: Yes  . Drug use: Yes     Allergies   Patient has no known allergies.   Review of Systems Review of Systems  Unable to perform ROS: Mental status change     Physical Exam Updated Vital Signs BP 116/74   Pulse 84   Temp 98.8 F (37.1 C) (Oral)   Resp 19   Ht 5\' 11"  (1.803 m)   Wt 81.6 kg (180 lb)   SpO2 99%   BMI 25.10 kg/m   Physical Exam  Constitutional: He is oriented to person, place, and time. He appears well-developed.  HENT:  Head: Atraumatic.  Eyes: EOM are normal. Pupils are equal, round, and reactive to light.    Neck: Neck supple.  Cardiovascular: Normal rate and intact distal pulses.  Pulmonary/Chest: Effort normal.  Abdominal: Soft. There is no tenderness.  Neurological: He is alert and oriented to person, place, and time.  Skin: Skin is warm.  Nursing note and vitals reviewed.    ED Treatments / Results  Labs (all labs ordered are listed, but only abnormal results are displayed) Labs Reviewed  COMPREHENSIVE METABOLIC PANEL - Abnormal; Notable for the following components:      Result Value   Glucose, Bld 134 (*)    ALT 12 (*)    All other components within normal limits  ACETAMINOPHEN LEVEL - Abnormal; Notable for the following components:   Acetaminophen (Tylenol), Serum <10 (*)    All other components within normal limits  CBC - Abnormal; Notable for the following components:   WBC 11.1 (*)    All other components within normal limits  ETHANOL  SALICYLATE LEVEL  RAPID URINE DRUG SCREEN, HOSP PERFORMED  CBG MONITORING, ED    EKG  EKG Interpretation  Date/Time:  Friday August 23 2017 01:50:25 EST Ventricular Rate:  92 PR Interval:  120 QRS Duration: 88 QT Interval:  348 QTC Calculation: 430 R Axis:   92 Text Interpretation:  Sinus rhythm with marked sinus arrhythmia Right atrial enlargement Rightward axis Borderline ECG [id number] No old tracing  to compare Confirmed by Derwood KaplanNanavati, Kvon Mcilhenny [phone] on 08/23/2017 2:33:18 AM       Radiology No results found.  Procedures Procedures (including critical care time)  CRITICAL CARE Performed by: Josecarlos Harriott   Total critical care time: 32  Minutes, acute psychoses, danger to self and others. Needs im sedation  Critical care time was exclusive of separately billable procedures and treating other patients.  Critical care was necessary to treat or prevent imminent or life-threatening deterioration.  Critical care was time spent personally by me on the following activities: development of treatment plan with patient and/or  surrogate as well as nursing, discussions with consultants, evaluation of patient's response to treatment, examination of patient, obtaining history from patient or surrogate, ordering and performing treatments and interventions, ordering and review of laboratory studies, ordering and review of radiographic studies, pulse oximetry and re-evaluation of patient's condition.   Medications Ordered in ED Medications  ziprasidone (GEODON) injection 20 mg (not administered)  sterile water (preservative free) injection (not administered)  OLANZapine zydis (ZYPREXA) disintegrating tablet 10 mg (not administered)    And  LORazepam (ATIVAN) tablet 1 mg (not administered)    And  ziprasidone (GEODON) injection 20 mg (not administered)  acetaminophen (TYLENOL) tablet 650 mg (not administered)  zolpidem (AMBIEN) tablet 5 mg (not administered)  ondansetron (ZOFRAN) tablet 4 mg (not administered)      Initial Impression / Assessment and Plan / ED Course  I have reviewed the triage vital signs and the nursing notes.  Pertinent labs & imaging results that were available during my care of the patient were reviewed by me and considered in my medical decision making (see chart for details).  Clinical Course as of Aug 23 429  Fri Aug 23, 2017  0423 Patient's sitter came by informed me that patient was being inappropriate.  Patient was masturbating in front of her, and calling her names.  Patient was calm and she first arrived but over time he has gotten aggressive and hostile.  Went to reassess patient.  He was calm and had informed him that I was going to discharge him.  Patient got extremely upset, and became very aggressive and hostile.  Patient started threatening to kill staff and became verbally abusive.  Patient stated that he has "killed all of his family members" and that he is a "vampire" and "drinks blood".  It started appearing to me that patient was psychotic, and unstable.  I will IVC the  patient.  We will give him IM Geodon for the safety of the patient and our staff.  All of the labs, besides tox screen is back and negative. Patient is medically cleared.  [AN]    Clinical Course User Index [AN] Derwood KaplanNanavati, Cherylin Waguespack, MD    Patient comes in with chief complaint of overdose. Patient is a poor historian, and we do not have a clear history as to what complaint is troubling him the most, and if he has abused any sort of drugs before coming to the ER. On exam patient does not have any focal neurologic deficit.  EKG is reassuring.  We will get basic psych/tox workup initiated..  Final Clinical Impressions(s) / ED Diagnoses   Final diagnoses:  Polysubstance abuse (HCC)  Schizophrenia, unspecified type [facility](HCC)    ED Discharge Orders    None       Derwood KaplanNanavati, Naquita Nappier, MD 08/23/17 0430
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
## 2017-08-23 NOTE — B[facility] Counselor (Signed)
Re-assessment:   TTS attempted to see patient to complete assessment. Per Jill AlexandersJustin, RN, patient continues to be asleep. TTS will be notified when patient is able to complete assessment.
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
## 2017-08-23 NOTE — BHH Counselor (Signed)
Per Drue Flirtandy, RN pt was given 20 mg of Geodon and unable to engage in TTS consult. Clinician asked RN if she would removed TTS consult until pt is roused/alert and able to engage in TTS consult.    Redmond Pullingreylese D Dantre Yearwood, MS, [facility], [facility] Triage Specialist [phone]

## 2017-08-23 NOTE — ED Notes (Signed)
Pt awake at this time. Pt eating a snack.
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
## 2017-08-23 NOTE — Progress Notes (Signed)
TTS spoke with Dartt, Sheppard Pentonorey D, RN who states the pt is still sleeping and unable to be assessed.  Princess BruinsAquicha Yuette Putnam, MSW, LCSW Therapeutic Triage Specialist  [phone]

## 2017-08-24 LAB — RAPID URINE DRUG SCREEN, HOSP PERFORMED
AMPHETAMINES: NOT DETECTED
BARBITURATES: NOT DETECTED
BENZODIAZEPINES: NOT DETECTED
COCAINE: NOT DETECTED
Opiates: NOT DETECTED
Tetrahydrocannabinol: POSITIVE — AB

## 2017-08-24 MED ORDER — ZIPRASIDONE MESYLATE 20 MG IM SOLR
20.0000 mg | Freq: Once | INTRAMUSCULAR | Status: AC
  Administered 2017-08-24: 20 mg via INTRAMUSCULAR
  Filled 2017-08-24 (×2): qty 20

## 2017-08-24 MED ORDER — RISPERIDONE 0.5 MG PO TABS
0.5000 mg | ORAL_TABLET | Freq: Two times a day (BID) | ORAL | Status: DC
  Administered 2017-08-24 – 2017-08-26 (×5): 0.5 mg via ORAL
  Filled 2017-08-24 (×5): qty 1

## 2017-08-24 MED ORDER — BENZTROPINE MESYLATE 1 MG PO TABS
0.5000 mg | ORAL_TABLET | Freq: Every day | ORAL | Status: DC
  Administered 2017-08-24 – 2017-08-26 (×3): 0.5 mg via ORAL
  Filled 2017-08-24 (×3): qty 1

## 2017-08-24 MED ORDER — STERILE WATER FOR INJECTION IJ SOLN
INTRAMUSCULAR | Status: AC
  Filled 2017-08-24: qty 10

## 2017-08-24 MED ORDER — STERILE WATER FOR INJECTION IJ SOLN
INTRAMUSCULAR | Status: AC
  Administered 2017-08-24: 1.2 mL
  Filled 2017-08-24: qty 10

## 2017-08-24 MED ORDER — LORAZEPAM 1 MG PO TABS
1.0000 mg | ORAL_TABLET | Freq: Once | ORAL | Status: AC
  Administered 2017-08-24: 1 mg via ORAL
  Filled 2017-08-24: qty 1

## 2017-08-24 MED ORDER — DIPHENHYDRAMINE HCL 25 MG PO CAPS
50.0000 mg | ORAL_CAPSULE | Freq: Once | ORAL | Status: AC
  Administered 2017-08-24: 50 mg via ORAL
  Filled 2017-08-24: qty 2

## 2017-08-24 MED ORDER — HYDROXYZINE HCL 25 MG PO TABS
25.0000 mg | ORAL_TABLET | Freq: Three times a day (TID) | ORAL | Status: DC | PRN
  Administered 2017-08-25 (×2): 25 mg via ORAL
  Filled 2017-08-24 (×3): qty 1

## 2017-08-24 NOTE — ED Notes (Signed)
Pt asked for RN to speak w/his father who is on the phone w/pt. Father asked if he may speak w/pt on a phone in pt's room. Advised pt's father pt uses phone from nurses' desk x 2 phone calls daily x 5 min each. Pt and father voiced understanding. Pt states he does not take Eliquis. Pt had what looks like a new pt Eliquis pack w/his belongings.
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
## 2017-08-24 NOTE — ED Notes (Signed)
Pt on 2nd and last phone call of the day. Voiced understanding.

## 2017-08-24 NOTE — ED Notes (Signed)
Spoke to MD to get PO ativan d/t pt outburst. Will try PO ativan and progress to Geodon if needed

## 2017-08-24 NOTE — ED Notes (Signed)
Pt called out stating he couldn't breath. Pt lungs CTA. Pt HOB raised at this time

## 2017-08-24 NOTE — ED Notes (Signed)
Woke pt to advise dinner tray has been delivered. Pt returned to sleeping.
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
## 2017-08-24 NOTE — BH Assessment (Addendum)
Tele Assessment Note   Patient Name: Marc Hart MRN: [id number] Referring Physician: Dr. Vivia EwingNanvati Location of Patient: [facility] Location of Provider: Behavioral Health TTS Department  Marc MartinezLandon D Hart is an 26 y.o. male. Pt was not oriented. Pt was poor historian. Pt did not cooperate with the writer during the assessment. Pt was verbally aggressive. Pt was yelling throughout the assessment. Pt repeatedly stated "they took my child away and will not give it back.." Pt had tangential speech. Pt had flight of ideas.  Per Epic notes the Pt admits to using marijuana and "toxic mushrooms."  Inetta Fermoina, NP recommends inpatient treatment. TTS to seek placement. Inetta Fermoina, NP will be giving medication recommendations.   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 No pertinent past medical history.  History reviewed. No pertinent surgical history.  Family History: No family history on file.  Social History:  reports that  has never smoked. he has never used smokeless tobacco. He reports that he drinks alcohol. He reports that he uses drugs.  Additional Social History:  Alcohol / Drug Use Pain Medications: please see mar Prescriptions: please see mar Over the Counter: please see mar History of alcohol / drug use?: Yes Longest period of sobriety (when/how long): NA Substance #1 Name of Substance 1: marijuana 1 - Age of First Use: unknown 1 - Amount (size/oz): unknown 1 - Frequency: unknown 1 - Duration: unknown 1 - Last Use / Amount: unknown  CIWA: CIWA-Ar BP: (!) 102/56 Pulse Rate: (!) 54 COWS:    PATIENT STRENGTHS: (choose at least two) Average or above average intelligence Communication skills  Allergies: No Known Allergies  Home Medications:  (Not in a hospital admission)  OB/GYN Status:  No LMP for male patient.  General Assessment Data Assessment unable to be completed: Yes Reason for not completing assessment: TTS spoke with Dartt, Sheppard Pentonorey D, RN  who states the pt is still sleeping and unable to be assessed. [facility] TTS Assessment: In system Is this a Tele or Face-to-Face Assessment?: Tele Assessment Is this an Initial Assessment or a Re-assessment for this encounter?: Initial Assessment Marital status: Single Maiden name: NA Is patient pregnant?: No Pregnancy Status: No Living Arrangements: (unknown) Can pt return to current living arrangement?: Yes Admission Status: Involuntary Is patient capable of signing voluntary admission?: No Referral Source: Self/Family/Friend Insurance type: SP     Crisis Care Plan Living Arrangements: (unknown) Legal Guardian: Other:(self) Name of Psychiatrist: NA Name of Therapist: NA  Education Status Is patient currently in school?: No Current Grade: unknown Highest grade of school patient has completed: unknown Name of school: unknown Contact person: NA  Risk to self with the past 6 months Suicidal Ideation: No Has patient been a risk to self within the past 6 months prior to admission? : No Suicidal Intent: No Has patient had any suicidal intent within the past 6 months prior to admission? : No Is patient at risk for suicide?: No Suicidal Plan?: No Has patient had any suicidal plan within the past 6 months prior to admission? : No Access to Means: No What has been your use of drugs/alcohol within the last 12 months?: marijuana, mushrooms Previous Attempts/Gestures: No How many times?: 0 Other Self Harm Risks: NA Triggers for Past Attempts: None known Intentional Self Injurious Behavior: None Family Suicide History: Unknown Recent stressful life event(s): (unknown) Persecutory voices/beliefs?: No Depression: No Depression Symptoms: (unknown) Substance abuse history and/or treatment for substance abuse?: (refuses to answer) Suicide prevention information  given to non-admitted patients: Not applicable  Risk to Others within the past 6 months Homicidal  Ideation: No Does patient have any lifetime risk of violence toward others beyond the six months prior to admission? : Unknown Thoughts of Harm to Others: No Current Homicidal Intent: No Current Homicidal Plan: No Access to Homicidal Means: No Identified Victim: unknown History of harm to others?: No Assessment of Violence: None Noted Violent Behavior Description: NA Does patient have access to weapons?: No Criminal Charges Pending?: No Does patient have a court date: No Is patient on probation?: No  Psychosis Hallucinations: None noted(pt refused to answer) Delusions: None noted(pt refused to answer)  Mental Status Report Appearance/Hygiene: Unremarkable, In scrubs Eye Contact: Poor Motor Activity: Freedom of movement, Unremarkable Speech: Tangential Level of Consciousness: Alert Mood: Angry Affect: Angry Anxiety Level: Minimal Thought Processes: Flight of Ideas, Tangential Judgement: Impaired Orientation: Not oriented Obsessive Compulsive Thoughts/Behaviors: None  Cognitive Functioning Concentration: Decreased Memory: Recent Impaired, Remote Impaired IQ: Average Insight: Poor Impulse Control: Poor Appetite: Fair Weight Loss: 0 Weight Gain: 0 Sleep: Unable to Assess Total Hours of Sleep: 8 Vegetative Symptoms: None  ADLScreening [facility] Assessment Services) Patient's cognitive ability adequate to safely complete daily activities?: Yes Patient able to express need for assistance with ADLs?: Yes Independently performs ADLs?: Yes (appropriate for developmental age)  Prior Inpatient Therapy Prior Inpatient Therapy: No(unknown) Prior Therapy Dates: Na Prior Therapy Facilty/Provider(s): nA Reason for Treatment: nA  Prior Outpatient Therapy Prior Outpatient Therapy: No Prior Therapy Dates: NA Prior Therapy Facilty/Provider(s): NA Reason for Treatment: nA Does patient have an ACCT team?: No Does patient have Intensive In-House Services?  : No Does patient have  [facility] services? : No Does patient have P4CC services?: No  ADL Screening (condition at time of admission) Patient's cognitive ability adequate to safely complete daily activities?: Yes Is the patient deaf or have difficulty hearing?: No Does the patient have difficulty seeing, even when wearing glasses/contacts?: No Does the patient have difficulty concentrating, remembering, or making decisions?: No Patient able to express need for assistance with ADLs?: Yes Does the patient have difficulty dressing or bathing?: No Independently performs ADLs?: Yes (appropriate for developmental age) Does the patient have difficulty walking or climbing stairs?: No Weakness of Legs: None Weakness of Arms/Hands: None       Abuse/Neglect Assessment (Assessment to be complete while patient is alone) Abuse/Neglect Assessment Can Be Completed: Yes Physical Abuse: Denies Verbal Abuse: Denies Sexual Abuse: Denies Exploitation of patient/patient's resources: Denies     Merchant navy officerAdvance Directives (For Healthcare) Does Patient Have a Medical Advance Directive?: No Would patient like information on creating a medical advance directive?: No - Patient declined    Additional Information 1:1 In Past 12 Months?: No CIRT Risk: Yes Elopement Risk: No Does patient have medical clearance?: Yes     Disposition:  Disposition Initial Assessment Completed for this Encounter: Yes Disposition of Patient: Inpatient treatment program Type of inpatient treatment program: Adult  This service was provided via telemedicine using a 2-way, interactive audio and video technology.  Names of all persons participating in this telemedicine service and their role in this encounter. Name: Riley LamJustina O. Role: NP  Name:  Role:   Name:  Role:   Name:  Role:     Emmit PomfretLevette,Niharika Savino D 08/24/2017 8:42 AM

## 2017-08-24 NOTE — ED Notes (Signed)
TTS being performed.  

## 2017-08-24 NOTE — ED Notes (Signed)
[facility], [facility], for med recommendations.

## 2017-08-24 NOTE — ED Notes (Signed)
Pt yelling and threatening staff -

## 2017-08-24 NOTE — ED Notes (Signed)
Pt awake - noted to be talking loudly - placed his right hand near nurse and squeezed his hand - stating "I want to squeeze your butt". Advised pt inappropriate behavior will not be tolerated.

## 2017-08-24 NOTE — ED Notes (Addendum)
Pt wanted tylenol. RN got tylenol for pt and pt stated he did not want it anymore d/t him being allergic to it. Pt stated he wanted ibuprofen or percocet. RN stated you would not get percocet, but I can get you ibuprofen. Pt started to get upset. Pt stated this was the worst visit her has ever had. Pt stated he needed his DC paperwork to go home.

## 2017-08-24 NOTE — ED Notes (Signed)
Pt yelling out random words for attention. [employer] officer in pod F at this time

## 2017-08-24 NOTE — ED Notes (Signed)
Pt up to use bathroom. Ambulates w/ no issues

## 2017-08-24 NOTE — Progress Notes (Signed)
TTS received a call from Dartt, Sheppard Pentonorey D, RN who states the pt is alert and able to participate in the assessment. Dartt, Sheppard Pentonorey D, RN advised all TTS counselors are currently working on other assessments with other patients at this time. Dartt, Sheppard Pentonorey D, RN advised staff will be notified that the pt is alert and the first available counselor will complete the assessment.  Princess BruinsAquicha Elwood Bazinet, MSW, LCSW Therapeutic Triage Specialist  [phone]

## 2017-08-24 NOTE — ED Notes (Signed)
Pt noted to be sleeping at times then wakes and yells - threatening staff and making delusional comments. RN has asked pt to please lower voice - pt refuses and states "Y'all are going to get it when they know what you're doing to me". Pt lying on bed. Sitter at doorway. Offered pt to void - refuses.

## 2017-08-24 NOTE — ED Notes (Signed)
Pt approached nursing station to ask for Ativan to help him rest. Pt was Very calm and polite at the time. Pt informed that RN would discuss with EDP.

## 2017-08-24 NOTE — ED Notes (Addendum)
Pt talking on phone at nurses' desk. Pt giving caller visiting times. Pt noted to be calm, cooperative.

## 2017-08-24 NOTE — ED Notes (Signed)
Pt lying on bed yelling "All the men jack off!" repeatedly - approx 6-7 times.

## 2017-08-24 NOTE — ED Notes (Signed)
Aware of need for urine specimen.

## 2017-08-24 NOTE — ED Notes (Addendum)
Pt asking for tylenol again at this time. Tylenol given to pt. Pt states he is not actually allergic to tylenol

## 2017-08-24 NOTE — ED Notes (Signed)
Pt has been moving about on bed. nad.

## 2017-08-24 NOTE — ED Notes (Signed)
RN asked pt if he would be able to do his skype call w/ the counselor. Pt stated he would be able to.

## 2017-08-25 MED ORDER — ZIPRASIDONE MESYLATE 20 MG IM SOLR
10.0000 mg | Freq: Once | INTRAMUSCULAR | Status: AC
  Administered 2017-08-25: 10 mg via INTRAMUSCULAR
  Filled 2017-08-25: qty 20

## 2017-08-25 MED ORDER — MIDAZOLAM HCL 2 MG/2ML IJ SOLN
4.0000 mg | Freq: Once | INTRAMUSCULAR | Status: AC
  Administered 2017-08-25: 4 mg via INTRAMUSCULAR
  Filled 2017-08-25: qty 4

## 2017-08-25 MED ORDER — STERILE WATER FOR INJECTION IJ SOLN
INTRAMUSCULAR | Status: AC
  Administered 2017-08-25: 1.2 mL
  Filled 2017-08-25: qty 10

## 2017-08-25 NOTE — ED Notes (Signed)
Pt talking to Sitter while VS's being taken.

## 2017-08-25 NOTE — ED Notes (Signed)
Pt initially refusing for VS's to be taken. Now pt allowing to be performed.

## 2017-08-25 NOTE — BHH Counselor (Signed)
Attempted re-assessment.  Pt remains asleep.

## 2017-08-25 NOTE — ED Provider Notes (Signed)
I received a call about the patient being increasingly aggressive.  He was yelling threats and of the entities that nurses and not following directions.  I will give him Geodon and Versed.  Reassess.   Melene PlanFloyd, Filippo Puls, DO 08/25/17 [phone]

## 2017-08-25 NOTE — ED Notes (Signed)
Sitting up in bed watching tv.  
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
## 2017-08-25 NOTE — ED Notes (Signed)
Pt tolerated injections well after much encouragement. Security and Off-Duty [employer] Officer standing by.

## 2017-08-25 NOTE — ED Notes (Signed)
Dr Adela LankFloyd aware of pt's behavior - yelling, cursing, throwing paper at staff. Refuses to lower voice after much encouragement.

## 2017-08-25 NOTE — ED Notes (Signed)
Pt has been moving about on bed.

## 2017-08-25 NOTE — ED Notes (Signed)
Pt given Caff-Free Coke as requested d/t pt was sleeping during snack time.

## 2017-08-25 NOTE — ED Notes (Signed)
Pharm Tech in w/pt. 

## 2017-08-26 NOTE — B[facility] Counselor (Signed)
Disposition:   Per Inetta Fermoina, NP, patient is psych-cleared.

## 2017-08-26 NOTE — B[facility] Counselor (Signed)
Re-assessment:   Patient report he feels better than he has been feeling since he came into the hospital. Report he was high on drugs and needed to sleep it off (UDS's positive for marijuana). Report over the past 4 years has only used drugs 4x. Report report his granddad passed away (unable to remember time) therefore he gave up on life.   Patient denies SI, HI, and AH and VH.   Patient report he lives alone, has his own apartment. Report family and friends assists with paying bills since he does not work.

## 2017-08-26 NOTE — ED Notes (Signed)
Mom came to visit patient , patient refused to talk with her.

## 2017-08-26 NOTE — ED Notes (Signed)
Pt belongings returned

## 2017-08-28 ENCOUNTER — Encounter (HOSPITAL_COMMUNITY): Payer: Self-pay | Admitting: Emergency Medicine

## 2017-09-04 ENCOUNTER — Emergency Department (HOSPITAL_COMMUNITY)
Admission: EM | Admit: 2017-09-04 | Discharge: 2017-09-04 | Disposition: A | Discharge status: Home / Self Care | Payer: Self-pay | Attending: Emergency Medicine | Admitting: Emergency Medicine

## 2017-09-04 ENCOUNTER — Encounter (HOSPITAL_COMMUNITY): Payer: Self-pay

## 2017-09-04 DIAGNOSIS — Z5321 Procedure and treatment not carried out due to patient leaving prior to being seen by health care provider: Secondary | ICD-10-CM | POA: Insufficient documentation

## 2017-09-04 DIAGNOSIS — M79604 Pain in right leg: Secondary | ICD-10-CM | POA: Insufficient documentation

## 2017-09-04 DIAGNOSIS — M79605 Pain in left leg: Secondary | ICD-10-CM | POA: Insufficient documentation

## 2017-09-04 NOTE — ED Notes (Signed)
Pt in lobby harassing other patients and making them uncomfortable. Pt continues to move from chair to chair. Upon standing pt stumbled and almost fell into child. Asked not to get up from chairs

## 2017-09-04 NOTE — ED Notes (Signed)
Security asked pt to leave property d/t disruptive and harassing behavior.

## 2017-09-04 NOTE — ED Triage Notes (Addendum)
Per Pt, Pt is coming in today for bilateral leg soreness. He is not cooperative at this time when asked what is going on, pt will not fully answer. Pt is alert and oriented x4 upon arrival to the ED. Reports that he is having some soreness in his legs and he recently did some drugs. Will not report when his last use was. Pt denies any cough, chest pain or SOB. No stating some generalized body soreness with some nasal congestion.

## 2017-09-04 NOTE — ED Notes (Signed)
Pt asking this RN when he will be seen. I informed him he was next for triage. Pt continues to ask the same question over and over. Pt asked to have a seat in chair.

## 2017-09-08 ENCOUNTER — Emergency Department (HOSPITAL_COMMUNITY)
Admission: EM | Admit: 2017-09-08 | Discharge: 2017-09-09 | Disposition: A | Discharge status: Home / Self Care | Payer: Self-pay | Attending: Emergency Medicine | Admitting: Emergency Medicine

## 2017-09-08 ENCOUNTER — Other Ambulatory Visit: Payer: Self-pay

## 2017-09-08 DIAGNOSIS — R45851 Suicidal ideations: Secondary | ICD-10-CM | POA: Insufficient documentation

## 2017-09-08 DIAGNOSIS — F209 Schizophrenia, unspecified: Secondary | ICD-10-CM | POA: Insufficient documentation

## 2017-09-08 DIAGNOSIS — R451 Restlessness and agitation: Secondary | ICD-10-CM | POA: Insufficient documentation

## 2017-09-08 DIAGNOSIS — F17210 Nicotine dependence, cigarettes, uncomplicated: Secondary | ICD-10-CM | POA: Insufficient documentation

## 2017-09-08 DIAGNOSIS — F259 Schizoaffective disorder, unspecified: Secondary | ICD-10-CM | POA: Diagnosis present

## 2017-09-08 DIAGNOSIS — Z91040 Latex allergy status: Secondary | ICD-10-CM | POA: Insufficient documentation

## 2017-09-08 DIAGNOSIS — R443 Hallucinations, unspecified: Secondary | ICD-10-CM | POA: Insufficient documentation

## 2017-09-08 DIAGNOSIS — Z91010 Allergy to peanuts: Secondary | ICD-10-CM | POA: Insufficient documentation

## 2017-09-08 DIAGNOSIS — Z046 Encounter for general psychiatric examination, requested by authority: Secondary | ICD-10-CM | POA: Insufficient documentation

## 2017-09-08 LAB — COMPREHENSIVE METABOLIC PANEL
ALT: 14 U/L — AB (ref 17–63)
AST: 19 U/L (ref 15–41)
Albumin: 4.2 g/dL (ref 3.5–5.0)
Alkaline Phosphatase: 65 U/L (ref 38–126)
Anion gap: 8 (ref 5–15)
BILIRUBIN TOTAL: 1 mg/dL (ref 0.3–1.2)
BUN: 11 mg/dL (ref 6–20)
CALCIUM: 9 mg/dL (ref 8.9–10.3)
CO2: 27 mmol/L (ref 22–32)
CREATININE: 0.94 mg/dL (ref 0.61–1.24)
Chloride: 104 mmol/L (ref 101–111)
GFR calc Af Amer: >60 mL/min (ref >60)
Glucose, Bld: 138 mg/dL — ABNORMAL HIGH (ref 65–99)
Potassium: 3.6 mmol/L (ref 3.5–5.1)
Sodium: 139 mmol/L (ref 135–145)
TOTAL PROTEIN: 6.8 g/dL (ref 6.5–8.1)

## 2017-09-08 LAB — ACETAMINOPHEN LEVEL

## 2017-09-08 LAB — CBC
HEMATOCRIT: 44.2 % (ref 39.0–52.0)
Hemoglobin: 15.5 g/dL (ref 13.0–17.0)
MCH: 30.7 pg (ref 26.0–34.0)
MCHC: 35.1 g/dL (ref 30.0–36.0)
MCV: 87.5 fL (ref 78.0–100.0)
PLATELETS: 200 10*3/uL (ref 150–400)
RBC: 5.05 MIL/uL (ref 4.22–5.81)
RDW: 12.6 % (ref 11.5–15.5)
WBC: 6.3 10*3/uL (ref 4.0–10.5)

## 2017-09-08 LAB — RAPID URINE DRUG SCREEN, HOSP PERFORMED
AMPHETAMINES: NOT DETECTED
BENZODIAZEPINES: NOT DETECTED
Barbiturates: NOT DETECTED
COCAINE: NOT DETECTED
Opiates: NOT DETECTED
Tetrahydrocannabinol: POSITIVE — AB

## 2017-09-08 LAB — ETHANOL: Alcohol, Ethyl (B): <10 mg/dL

## 2017-09-08 LAB — SALICYLATE LEVEL: Salicylate Lvl: <7 mg/dL (ref 2.8–30.0)

## 2017-09-08 MED ORDER — LORAZEPAM 2 MG/ML IJ SOLN
2.0000 mg | Freq: Once | INTRAMUSCULAR | Status: AC
  Administered 2017-09-08: 2 mg via INTRAMUSCULAR
  Filled 2017-09-08: qty 1

## 2017-09-08 MED ORDER — ZIPRASIDONE MESYLATE 20 MG IM SOLR
20.0000 mg | Freq: Once | INTRAMUSCULAR | Status: AC | PRN
  Administered 2017-09-08: 20 mg via INTRAMUSCULAR
  Filled 2017-09-08: qty 20

## 2017-09-08 MED ORDER — STERILE WATER FOR INJECTION IJ SOLN
INTRAMUSCULAR | Status: AC
  Administered 2017-09-08: 10 mL
  Filled 2017-09-08: qty 10

## 2017-09-08 NOTE — ED Notes (Signed)
[facility] Expected date:  Expected time:  Means of arrival:  Comments: [place]

## 2017-09-08 NOTE — BHH Counselor (Addendum)
Clinician attempted to complete pt's TTS consult however per pt's nurse is shouting and screaming. Clinician asked nurse to contact TTS once pt is calm and able to engage. Clinician discussed with [name], [place].    Marc Pullingreylese D Aniqa Hare, MS, [facility], [facility] Triage Specialist [phone]

## 2017-09-08 NOTE — ED Provider Notes (Signed)
[facility]-EMERGENCY DEPT Provider Note   CSN: [id number] Arrival date & time: 09/08/17  1904     History   Chief Complaint Chief Complaint  Patient presents with  . Suicidal    HPI Marc Hart is a 27 y.o. male past medical history of schizophrenia, polysubstance abuse, [facility].  [facility] IVC'd  this patient for SI and per triage note stated "I am just going to take care of myself and leave the world." He was also noted to be hallucinating.  Upon evaluation, pt unwilling to answer all questions. Does endorse SI that began this morning. States he does have a plan but tangential and does not describe plan. No medical complaints. Unable to assess full ROS.   LEVEL 5 CAVEAT; pt refusing to answer questions.  The history is provided by the patient. The history is limited by the condition of the patient.    Past Medical History:  Diagnosis Date  . Schizophrenia (HCC)   . Substance abuse [facility](HCC)     Patient Active Problem List   Diagnosis Date Noted  . Depression   . Polysubstance abuse (HCC)   . Substance induced mood disorder (HCC) 09/25/2015  . Alcohol dependence with uncomplicated withdrawal (HCC) 09/16/2015  . 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 Depressive disorder 09/12/2015  . History of schizophrenia   . Schizoaffective disorder (HCC) 12/18/2014    Past Surgical History:  Procedure Laterality Date  . OTHER SURGICAL HISTORY     Ear Surgery        Home Medications    Prior to Admission medications   Not on File    Family History No family history on file.  Social History Social History   Tobacco Use  . Smoking status: Current Some Day Smoker  . Smokeless tobacco: Never Used  Substance Use Topics  . Alcohol use: Yes  . Drug use: Yes    Types: Marijuana, IV, Cocaine    Comment: Reports Marijuana use today. Last use of cocaine yesterday 09/03/2017      Allergies   Amoxicillin; Amoxicillin; Ibuprofen; Peanuts [peanut oil]; Ibuprofen; Latex; Other; Peanut-containing drug products; Tylenol [acetaminophen]; Other; and Tylenol [acetaminophen]   Review of Systems Review of Systems  Unable to perform ROS: Psychiatric disorder  Psychiatric/Behavioral: Positive for hallucinations and suicidal ideas.     Physical Exam Updated Vital Signs BP 120/67 (BP Location: Left Arm)   Pulse 61   Temp 98.9 F (37.2 C) (Oral)   Resp 20   SpO2 97%   Physical Exam  Constitutional: He appears well-developed and well-nourished. No distress.  HENT:  Head: Normocephalic and atraumatic.  Eyes: Conjunctivae are normal.  Cardiovascular: Normal rate.  Pulmonary/Chest: Effort normal.  Abdominal: Soft.  Neurological: He is alert.  Skin: Skin is warm.  Psychiatric: His behavior is normal. His affect is labile. His speech is tangential. He expresses suicidal ideation.  Pt tangential with thought and answering questions. Pt's mood is labile, from agitated to calm and making eye contact. Initially, patient was inappropriate stating "how can I stay here when I need to have sex." "I haven't had sex in a while and I tried to get laid earlier but couldn't find anyone."  Does endorse SI but when asked for plan he began describing an apartment complex he used to live and described a Museum/gallery exhibitions officercircuit breaker. States he turned it off and it became dark, then "I don't know what happened ma'am."  Nursing  note and vitals reviewed.    ED Treatments / Results  Labs (all labs ordered are listed, but only abnormal results are displayed) Labs Reviewed  COMPREHENSIVE METABOLIC PANEL - Abnormal; Notable for the following components:      Result Value   Glucose, Bld 138 (*)    ALT 14 (*)    All other components within normal limits  ACETAMINOPHEN LEVEL - Abnormal; Notable for the following components:   Acetaminophen (Tylenol), Serum <10 (*)    All other components within  normal limits  RAPID URINE DRUG SCREEN, HOSP PERFORMED - Abnormal; Notable for the following components:   Tetrahydrocannabinol POSITIVE (*)    All other components within normal limits  ETHANOL  SALICYLATE LEVEL  CBC    EKG  EKG Interpretation None       Radiology No results found.  Procedures Procedures (including critical care time)  Medications Ordered in ED Medications  ziprasidone (GEODON) injection 20 mg (20 mg Intramuscular Given 09/08/17 2124)  LORazepam (ATIVAN) injection 2 mg (2 mg Intramuscular Given 09/08/17 2124)  sterile water (preservative free) injection (10 mLs  Given 09/08/17 2124)     Initial Impression / Assessment and Plan / ED Course  I have reviewed the triage vital signs and the nursing notes.  Pertinent labs & imaging results that were available during my care of the patient were reviewed by me and considered in my medical decision making (see chart for details).     Pt presenting from [facility] with IVC paperwork for SI, aggressive behavior and hallucinations. Pt labile on exam, unable to obtain full ROS, however pt does endorse SI. Geodon and ativan given for agitation. Labs unremarkable. Pt is medically cleared for TTS evaluation.   Discussed results, findings, treatment and follow up. Patient advised of return precautions. Patient verbalized understanding and agreed with plan.  Final Clinical Impressions(s) / ED Diagnoses   Final diagnoses:  Suicidal ideation  Schizophrenia, unspecified type [facility])  Agitation    ED Discharge Orders    None       Robinson, [place] N, PA-C 09/08/17 2353    Charlynne Pander, MD 09/09/17 1452

## 2017-09-08 NOTE — ED Notes (Signed)
Pt is verbally escalating threatening the sitter and staff members. Verbal deescalation seems un-effective at this time. Will communicate these to EDP for further recommendations/orders.
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
## 2017-09-08 NOTE — ED Notes (Signed)
Bed: [id number] Expected date:  Expected time:  Means of arrival:  Comments[facility]

## 2017-09-08 NOTE — Progress Notes (Signed)
TTS attempted to assess pt. Spoke with pt's nurse Jari Favrescar, RN who states the pt was given Geodon due to agitation. Pt is unable to be assessed at this time.   Princess BruinsAquicha Konnor Jorden, MSW, LCSW Therapeutic Triage Specialist  [phone]

## 2017-09-08 NOTE — ED Notes (Signed)
SBAR Report received from previous nurse. Pt received calm and visible on unit. Pt unable to answer assessment questions related to current SI/ HI, A/V H, depression, anxiety, or pain at this time, and appears otherwise stable and free of distress. Pt reminded of camera surveillance, q 15 min rounds, and rules of the milieu. Will continue to assess. Pt showered came out of the rest room wearing only the mesh briefs and was asking the MHT to join him in the shower. He has been directed to bring his concerns to me directly

## 2017-09-09 DIAGNOSIS — F259 Schizoaffective disorder, unspecified: Secondary | ICD-10-CM

## 2017-09-09 DIAGNOSIS — Z590 Homelessness: Secondary | ICD-10-CM

## 2017-09-09 DIAGNOSIS — Z560 Unemployment, unspecified: Secondary | ICD-10-CM

## 2017-09-09 NOTE — Progress Notes (Signed)
TTS attempted to assess pt. Pt is still currently sleeping and unable to be aroused in order to engage in the assessment. TTS to complete assessment once pt is alert and able to participate. Darel HongJudy, RN advised the pt is still sleeping and unable to be aroused.  Princess BruinsAquicha Janaki Hart, MSW, LCSW Therapeutic Triage Specialist  [phone]'

## 2017-09-09 NOTE — [facility] Suicide Risk Assessment (Signed)
Suicide Risk Assessment  Discharge Assessment   [facility]   Principal Problem: Schizoaffective disorder [facility](HCC) Discharge Diagnoses:  Patient Active Problem List   Diagnosis Date Noted  . Depression [F32.9]   . Polysubstance abuse (HCC) [F19.10]   . Substance induced mood disorder (HCC) [F19.94] 09/25/2015  . Alcohol dependence with uncomplicated withdrawal (HCC) [F10.230] 09/16/2015  . Alcohol-induced mood disorder (HCC) [F10.94] 09/16/2015  . Depressive disorder [F32.9] 09/12/2015  . History of schizophrenia [Z86.59]   . Schizoaffective disorder (HCC) [F25.9] 12/18/2014   Pt was seen and chart reviewed with Dr Sharma CovertNorman and treatment team. Pt was threatening to jump off a bridge. Today,  Pt denies suicidal/homicidal ideation, denies auditory/visual hallucinations and does not appear to be responding to internal stimuli. Pt stated he just got out of jail and is homeless but can call his mother to come and get him. Pt is stable and psychiatrically clear for discharge.  Total Time spent with patient: 45 minutes  Musculoskeletal: Strength & Muscle Tone: within normal limits Gait & Station: normal Patient leans: N/A  Psychiatric Specialty Exam:   Blood pressure 120/67, pulse 61, temperature 98.9 F (37.2 C), temperature source Oral, resp. rate 20, SpO2 97 %.There is no height or weight on file to calculate BMI.  General Appearance: Casual  Eye Contact::  Good  Speech:  Clear and Coherent and Normal Rate409  Volume:  Normal  Mood:  Anxious and Irritable  Affect:  Congruent  Thought Process:  Coherent, Goal Directed and Linear  Orientation:  Full (Time, Place, and Person)  Thought Content:  Logical  Suicidal Thoughts:  No  Homicidal Thoughts:  No  Memory:  Immediate;   Good Recent;   Fair Remote;   Fair  Judgement:  Fair  Insight:  Shallow  Psychomotor Activity:  Normal  Concentration:  Good  Recall:  Good  Fund of Knowledge:Good  Language: Good   Akathisia:  No  Handed:  Right  AIMS (if indicated):     Assets:  Communication Skills Physical Health Resilience Social Support  Sleep:     Cognition: WNL  ADL's:  Intact   Mental Status Per Nursing Assessment::   On Admission:   Suicidal ideation  Demographic Factors:  Male, Adolescent or young adult, Caucasian, Low socioeconomic status and Unemployed  Loss Factors: Legal issues and Financial problems/change in socioeconomic status  Historical Factors: Impulsivity  Risk Reduction Factors:   Responsible for children under 25 years of age and Sense of responsibility to family  Continued Clinical Symptoms:  Bipolar Disorder:   Depressive phase Depression:   Impulsivity Alcohol/Substance Abuse/Dependencies More than one psychiatric diagnosis Previous Psychiatric Diagnoses and Treatments  Cognitive Features That Contribute To Risk:  Closed-mindedness    Suicide Risk:  Minimal: No identifiable suicidal ideation.  Patients presenting with no risk factors but with morbid ruminations; may be classified as minimal risk based on the severity of the depressive symptoms    Plan Of Care/Follow-up recommendations:  Activity:  as tolerated Diet:  Heart Healthy  Laveda AbbeLaurie Britton Karmella Bouvier, NP 09/09/2017, 12:18 PM

## 2017-09-09 NOTE — ED Notes (Signed)
Pt discharged home. Discharged instructions read to pt who verbalized understanding. All belongings returned to pt who signed for same. Denies SI/HI, is not delusional and not responding to internal stimuli. Escorted pt to the ED exit.    

## 2017-09-09 NOTE — Discharge Instructions (Signed)
To help you maintain a sober lifestyle, a substance abuse treatment program may be beneficial to you.  Contact [facility] at your earliest opportunity to ask about enrolling in their program: ° °     [facility] ([facility] °     [address] °     [place], [place] [address] °     [phone] °     New patients are seen at the walk-in clinic every Tuesday from 9:00 am - 12:00 pm °

## 2017-09-09 NOTE — BH Assessment (Signed)
[facility] Assessment Progress Note Patient was evaluated by Sharma CovertNorman MD, Arville CareParks FNP who recommended IVC be rescinded and patient be discharged this date and encouraged to follow with OP resources that will be provided.

## 2017-09-09 NOTE — BH Assessment (Signed)
[facility] Assessment Progress Note  Per Jacqueline Norman, DO, this pt does not require psychiatric hospitalization at this time.  Pt is to be discharged from [facility] with recommendation to follow [facility] with [facility].  This has been included in pt's discharge instructions.  Pt would also benefit from seeing Peer Support Specialists; they will be asked to speak to pt.  Pt's nurse, Diane, has been notified.  Marc Stanish, MA Triage Specialist [phone]     

## 2017-09-09 NOTE — ED Notes (Signed)
When speaking with the treatment team this morning pt indicated that he is ready to go home and denied thoughts of wanting to hurt himself or others. He then made a few telephone calls. Subsequently he told this writer that he thinks he needs to be admitted because he has multiple personalities.
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
## 2017-09-13 ENCOUNTER — Other Ambulatory Visit: Payer: Self-pay

## 2017-09-13 ENCOUNTER — Encounter (HOSPITAL_COMMUNITY): Payer: Self-pay | Admitting: Emergency Medicine

## 2017-09-13 DIAGNOSIS — M79673 Pain in unspecified foot: Secondary | ICD-10-CM | POA: Insufficient documentation

## 2017-09-13 DIAGNOSIS — Z5321 Procedure and treatment not carried out due to patient leaving prior to being seen by health care provider: Secondary | ICD-10-CM | POA: Insufficient documentation

## 2017-09-13 LAB — CBG MONITORING, ED: GLUCOSE-CAPILLARY: 76 mg/dL (ref 65–99)

## 2017-09-13 NOTE — ED Notes (Addendum)
Checked CBG 76, RN Kim informed

## 2017-09-13 NOTE — ED Triage Notes (Addendum)
Pt to ED with c/o bil foot pain for several years.  Pt st's he is diabetic but can't afford his medications. Now pt is changing his story and st's he is here because he has a cold because he is homeless.

## 2017-09-14 ENCOUNTER — Emergency Department (HOSPITAL_COMMUNITY)
Admission: EM | Admit: 2017-09-14 | Discharge: 2017-09-14 | Discharge status: Against Medical Advice | Payer: Self-pay | Attending: Emergency Medicine | Admitting: Emergency Medicine

## 2017-09-14 NOTE — ED Notes (Signed)
Pt stated to Benita- EMT that he does not want to be seen he just wants to be out of the cold. Pt currently asleep on Waiting Room bench.

## 2017-10-01 ENCOUNTER — Emergency Department (HOSPITAL_COMMUNITY)
Admission: EM | Admit: 2017-10-01 | Discharge: 2017-10-01 | Disposition: A | Discharge status: Home / Self Care | Payer: Self-pay | Attending: Emergency Medicine | Admitting: Emergency Medicine

## 2017-10-01 ENCOUNTER — Other Ambulatory Visit: Payer: Self-pay

## 2017-10-01 ENCOUNTER — Encounter (HOSPITAL_COMMUNITY): Payer: Self-pay

## 2017-10-01 DIAGNOSIS — Y939 Activity, unspecified: Secondary | ICD-10-CM | POA: Insufficient documentation

## 2017-10-01 DIAGNOSIS — S61212A Laceration without foreign body of right middle finger without damage to nail, initial encounter: Secondary | ICD-10-CM | POA: Insufficient documentation

## 2017-10-01 DIAGNOSIS — W268XXA Contact with other sharp object(s), not elsewhere classified, initial encounter: Secondary | ICD-10-CM | POA: Insufficient documentation

## 2017-10-01 DIAGNOSIS — Y929 Unspecified place or not applicable: Secondary | ICD-10-CM | POA: Insufficient documentation

## 2017-10-01 DIAGNOSIS — Y999 Unspecified external cause status: Secondary | ICD-10-CM | POA: Insufficient documentation

## 2017-10-01 DIAGNOSIS — Z91040 Latex allergy status: Secondary | ICD-10-CM | POA: Insufficient documentation

## 2017-10-01 DIAGNOSIS — Z91010 Allergy to peanuts: Secondary | ICD-10-CM | POA: Insufficient documentation

## 2017-10-01 DIAGNOSIS — F17210 Nicotine dependence, cigarettes, uncomplicated: Secondary | ICD-10-CM | POA: Insufficient documentation

## 2017-10-01 MED ORDER — DOXYCYCLINE HYCLATE 100 MG PO CAPS: 100.0000 mg | ORAL_CAPSULE | Freq: Two times a day (BID) | ORAL | 0 refills | Status: DC

## 2017-10-01 MED ORDER — LIDOCAINE HCL (PF) 1 % IJ SOLN
5.0000 mL | Freq: Once | INTRAMUSCULAR | Status: AC
  Administered 2017-10-01: 5 mL
  Filled 2017-10-01: qty 5

## 2017-10-01 NOTE — ED Provider Notes (Signed)
Marc Hart [facility] EMERGENCY DEPARTMENT Provider Note   CSN: [id number] Arrival date & time: 10/01/17  1527     History   Chief Complaint No chief complaint on file.   HPI Marc Hart is a 27 y.o. male presenting for evaluation of finger laceration.  Patient states he was throwing a can at a wall to open it, when he picked it up and cut his right middle finger.  Laceration is on the pad of the finger.  He has full range of motion of the finger and denies any new numbness or tingling.  Bleeding was easily controlled, and is not actively bleeding.  He denies injury elsewhere.  He had a tetanus shot 2 years ago.  He denies immunocompromised status or history of diabetes.  He is not on any blood thinners.  Laceration occurred just prior to arrival.  HPI  Past Medical History:  Diagnosis Date  . Schizophrenia (HCC)   . Substance abuse [facility])     Patient Active Problem List   Diagnosis Date Noted  . Depression   . Polysubstance abuse (HCC)   . Substance induced mood disorder (HCC) 09/25/2015  . Alcohol dependence with uncomplicated withdrawal (HCC) 09/16/2015  . 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 Depressive disorder 09/12/2015  . History of schizophrenia   . Schizoaffective disorder (HCC) 12/18/2014    Past Surgical History:  Procedure Laterality Date  . OTHER SURGICAL HISTORY     Ear Surgery        Home Medications    Prior to Admission medications   Medication Sig Start Date End Date Taking? Authorizing Provider  doxycycline (VIBRAMYCIN) 100 MG capsule Take 1 capsule (100 mg total) by mouth 2 (two) times daily. 10/01/17   Terryl Niziolek, PA-C    Family History No family history on file.  Social History Social History   Tobacco Use  . Smoking status: Current Some Day Smoker  . Smokeless tobacco: Never Used  Substance Use Topics  . Alcohol use: Yes  . Drug use: Yes    Types: Marijuana, IV, Cocaine    Comment: Reports  Marijuana use today. Last use of cocaine yesterday 09/03/2017     Allergies   Amoxicillin; Amoxicillin; Ibuprofen; Peanuts [peanut oil]; Ibuprofen; Latex; Other; Peanut-containing drug products; Tylenol [acetaminophen]; Other; and Tylenol [acetaminophen]   Review of Systems Review of Systems  Skin: Positive for wound.  Neurological: Negative for numbness.  Hematological: Does not bruise/bleed easily.     Physical Exam Updated Vital Signs BP 118/68 (BP Location: Left Arm)   Pulse 79   Temp 98.2 F (36.8 C) (Oral)   Resp 16   Ht 5\' 11"  (1.803 m)   Wt 81.6 kg (180 lb)   SpO2 100%   BMI 25.10 kg/m   Physical Exam  Constitutional: He is oriented to person, place, and time. He appears well-developed and well-nourished. No distress.  HENT:  Head: Normocephalic and atraumatic.  Eyes: EOM are normal.  Neck: Normal range of motion.  Pulmonary/Chest: Effort normal.  Abdominal: He exhibits no distension.  Musculoskeletal: Normal range of motion.  Full active range of motion of the finger.  Strength against resistance intact.  Sensation intact.  Radial pulses equal bilaterally.  Good cap refill.  Neurological: He is alert and oriented to person, place, and time.  Skin: Skin is warm. No rash noted.  0.75 cm lack of the pad of the right finger without active bleeding.  Approximately 2 mm deep.  No obvious injury  noted elsewhere.  Psychiatric: He has a normal mood and affect.  Nursing note and vitals reviewed.    ED Treatments / Results  Labs (all labs ordered are listed, but only abnormal results are displayed) Labs Reviewed - No data to display  EKG  EKG Interpretation None       Radiology No results found.  Procedures .Marland Kitchen.Laceration Repair Date/Time: 10/01/2017 6:30 PM Performed by: Alveria Apleyaccavale, Colburn Asper, PA-C Authorized by: Alveria Apleyaccavale, Osei Anger, PA-C   Consent:    Consent obtained:  Verbal   Consent given by:  Patient   Risks discussed:  Infection, pain, poor cosmetic  result and poor wound healing   Alternatives discussed:  No treatment Anesthesia (see MAR for exact dosages):    Anesthesia method:  Local infiltration   Local anesthetic:  Lidocaine 1% w/o epi Laceration details:    Location:  Finger   Finger location:  R long finger   Length (cm):  0.8   Depth (mm):  2 Repair type:    Repair type:  Simple Exploration:    Wound exploration: wound explored through full range of motion and entire depth of wound probed and visualized     Wound exploration comment:  Wound explored to its fullest extent in a nonbloody field   Wound extent: no muscle damage noted, no nerve damage noted and no tendon damage noted   Treatment:    Area cleansed with:  Saline and Hibiclens   Amount of cleaning:  Standard   Irrigation solution:  Sterile water   Irrigation method:  Syringe Skin repair:    Repair method:  Sutures   Suture size:  5-0   Suture material:  Prolene   Suture technique:  Simple interrupted   Number of sutures:  2 Approximation:    Approximation:  Close Post-procedure details:    Dressing:  Sterile dressing   Patient tolerance of procedure:  Tolerated well, no immediate complications   (including critical care time)  Medications Ordered in ED Medications  lidocaine (PF) (XYLOCAINE) 1 % injection 5 mL (5 mLs Infiltration Given by Other 10/01/17 1823)     Initial Impression / Assessment and Plan / ED Course  I have reviewed the triage vital signs and the nursing notes.  Pertinent labs & imaging results that were available during my care of the patient were reviewed by me and considered in my medical decision making (see chart for details).     Patient presenting for laceration of the right middle finger.  No active bleeding.  He is neurovascularly intact.  Tetanus is up-to-date.  Wound was numbed, cleaned, and sutured.  Aftercare instructions given.  Patient placed on antibiotics.  At this time, patient appears safe for discharge.  Return  precautions given.  Patient states he understands and agrees to plan.   Final Clinical Impressions(s) / ED Diagnoses   Final diagnoses:  Laceration of right middle finger without foreign body without damage to nail, initial encounter    ED Discharge Orders        Ordered    doxycycline (VIBRAMYCIN) 100 MG capsule  2 times daily     10/01/17 1851       Alveria ApleyCaccavale, Avrom Robarts, PA-C 10/01/17 1947    Linwood DibblesKnapp, Jon, MD 10/02/17 [phone]

## 2017-10-01 NOTE — ED Triage Notes (Signed)
Patient here with small laceration to right hand middle finger after cutting same on top of can

## 2017-10-01 NOTE — ED Notes (Signed)
Patient ambulated to restroom.

## 2017-10-01 NOTE — ED Notes (Signed)
Provided [place] sandwich and coke, also given a bus pass and prescription discount card. Patient verbalized understanding of discharge instructions and denies any further needs or questions at this time. VS stable. Patient ambulatory with steady gait.

## 2017-10-01 NOTE — Discharge Instructions (Signed)
1. Medications: Tylenol or ibuprofen for pain 2. Treatment: ice for swelling, keep wound clean with warm soap and water and keep bandage dry, do not submerge in water for 24 hours 3. Follow Up: Please return in 7-10 days to have your stitches/staples removed or sooner if you have concerns. Return to the emergency department for increased redness, drainage of pus from the wound   WOUND CARE  Keep area clean and dry for 24 hours. Do not remove bandage, if applied.  After 24 hours, remove bandage and wash wound gently with mild soap and warm water. Reapply a new bandage after cleaning wound, if directed.   Continue daily cleansing with soap and water until stitches/staples are removed.  Do not apply any ointments or creams to the wound while stitches/staples are in place, as this may cause delayed healing. Return if you experience any of the following signs of infection: Swelling, redness, pus drainage, streaking, fever >101.0 F  Return if you experience excessive bleeding that does not stop after 15-20 minutes of constant, firm pressure.

## 2017-10-01 NOTE — ED Notes (Signed)
Patient states he was opening a can of beans and cut the distal part of his R middle finger. Approx. 0.5 inch laceration to pad of finger. Bleeding controlled.

## 2017-10-19 ENCOUNTER — Emergency Department (HOSPITAL_COMMUNITY)
Admission: EM | Admit: 2017-10-19 | Discharge: 2017-10-19 | Disposition: A | Discharge status: Home / Self Care | Payer: Self-pay | Attending: Emergency Medicine | Admitting: Emergency Medicine

## 2017-10-19 ENCOUNTER — Other Ambulatory Visit: Payer: Self-pay

## 2017-10-19 ENCOUNTER — Encounter (HOSPITAL_COMMUNITY): Payer: Self-pay | Admitting: Emergency Medicine

## 2017-10-19 DIAGNOSIS — Z91040 Latex allergy status: Secondary | ICD-10-CM | POA: Insufficient documentation

## 2017-10-19 DIAGNOSIS — J069 Acute upper respiratory infection, unspecified: Secondary | ICD-10-CM | POA: Insufficient documentation

## 2017-10-19 DIAGNOSIS — Z91010 Allergy to peanuts: Secondary | ICD-10-CM | POA: Insufficient documentation

## 2017-10-19 DIAGNOSIS — F17210 Nicotine dependence, cigarettes, uncomplicated: Secondary | ICD-10-CM | POA: Insufficient documentation

## 2017-10-19 MED ORDER — ALBUTEROL SULFATE HFA 108 (90 BASE) MCG/ACT IN AERS
2.0000 | INHALATION_SPRAY | RESPIRATORY_TRACT | Status: DC | PRN
  Administered 2017-10-19: 2 via RESPIRATORY_TRACT
  Filled 2017-10-19: qty 6.7

## 2017-10-19 MED ORDER — DEXAMETHASONE SODIUM PHOSPHATE 10 MG/ML IJ SOLN
10.0000 mg | Freq: Once | INTRAMUSCULAR | Status: AC
  Administered 2017-10-19: 10 mg via INTRAMUSCULAR
  Filled 2017-10-19: qty 1

## 2017-10-19 NOTE — ED Notes (Signed)
Pt reports "not feeling well" since Monday. Endorses malaise, body aches, congestion

## 2017-10-19 NOTE — ED Triage Notes (Signed)
Patient states that he has not been feeling well for the last few days.  He has had headache and congestion.

## 2017-10-19 NOTE — ED Provider Notes (Signed)
[facility] EMERGENCY DEPARTMENT Provider Note   CSN: [id number] Arrival date & time: 10/19/17  0554     History   Chief Complaint Chief Complaint  Patient presents with  . URI    HPI Marc Hart is a 27 y.o. male.  Patient presents to the ER for evaluation of upper respiratory infection symptoms.  He reports he has not been feeling well for several days.  He reports sinus congestion, cough and chest congestion.  He has not noticed any fever.  No vomiting or diarrhea.      Past Medical History:  Diagnosis Date  . Schizophrenia (HCC)   . Substance abuse [facility])     Patient Active Problem List   Diagnosis Date Noted  . Depression   . Polysubstance abuse (HCC)   . Substance induced mood disorder (HCC) 09/25/2015  . Alcohol dependence with uncomplicated withdrawal (HCC) 09/16/2015  . 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 Depressive disorder 09/12/2015  . History of schizophrenia   . Schizoaffective disorder (HCC) 12/18/2014    Past Surgical History:  Procedure Laterality Date  . OTHER SURGICAL HISTORY     Ear Surgery        Home Medications    Prior to Admission medications   Medication Sig Start Date End Date Taking? Authorizing Provider  doxycycline (VIBRAMYCIN) 100 MG capsule Take 1 capsule (100 mg total) by mouth 2 (two) times daily. 10/01/17   Caccavale, Sophia, PA-C    Family History No family history on file.  Social History Social History   Tobacco Use  . Smoking status: Current Some Day Smoker  . Smokeless tobacco: Never Used  Substance Use Topics  . Alcohol use: Yes  . Drug use: Yes    Types: Marijuana, IV, Cocaine    Comment: Reports Marijuana use today. Last use of cocaine yesterday 09/03/2017     Allergies   Amoxicillin; Amoxicillin; Ibuprofen; Peanuts [peanut oil]; Ibuprofen; Latex; Other; Peanut-containing drug products; Tylenol [acetaminophen]; Other; and Tylenol [acetaminophen]   Review of  Systems Review of Systems  HENT: Positive for congestion.   Respiratory: Positive for cough.   All other systems reviewed and are negative.    Physical Exam Updated Vital Signs BP 131/80 (BP Location: Right Arm)   Pulse 78   Temp 97.9 F (36.6 C) (Oral)   Resp 16   SpO2 98%   Physical Exam  Constitutional: He is oriented to person, place, and time. He appears well-developed and well-nourished. No distress.  HENT:  Head: Normocephalic and atraumatic.  Right Ear: Hearing normal.  Left Ear: Hearing normal.  Nose: Nose normal.  Mouth/Throat: Oropharynx is clear and moist and mucous membranes are normal.  Eyes: Conjunctivae and EOM are normal. Pupils are equal, round, and reactive to light.  Neck: Normal range of motion. Neck supple.  Cardiovascular: Regular rhythm, S1 normal and S2 normal. Exam reveals no gallop and no friction rub.  No murmur heard. Pulmonary/Chest: Effort normal and breath sounds normal. No respiratory distress. He exhibits no tenderness.  Abdominal: Soft. Normal appearance and bowel sounds are normal. There is no hepatosplenomegaly. There is no tenderness. There is no rebound, no guarding, no tenderness at McBurney's point and negative Murphy's sign. No hernia.  Musculoskeletal: Normal range of motion.  Neurological: He is alert and oriented to person, place, and time. He has normal strength. No cranial nerve deficit or sensory deficit. Coordination normal. GCS eye subscore is 4. GCS verbal subscore is 5. GCS motor subscore is  6.  Skin: Skin is warm, dry and intact. No rash noted. No cyanosis.  Psychiatric: He has a normal mood and affect. His speech is normal and behavior is normal. Thought content normal.  Nursing note and vitals reviewed.    ED Treatments / Results  Labs (all labs ordered are listed, but only abnormal results are displayed) Labs Reviewed - No data to display  EKG  EKG Interpretation None       Radiology No results  found.  Procedures Procedures (including critical care time)  Medications Ordered in ED Medications  dexamethasone (DECADRON) injection 10 mg (not administered)  albuterol (PROVENTIL HFA;VENTOLIN HFA) 108 (90 Base) MCG/ACT inhaler 2 puff (not administered)     Initial Impression / Assessment and Plan / ED Course  I have reviewed the triage vital signs and the nursing notes.  Pertinent labs & imaging results that were available during my care of the patient were reviewed by me and considered in my medical decision making (see chart for details).     Patient appears well.  He has not hypoxic, tachypneic or tachycardic.  He is afebrile.  Lungs are clear, no clinical concern for pneumonia.  He is a smoker.  He reports cough and chest congestion.  Will treat with IM Decadron and albuterol, does not require any further workup or intervention.  Final Clinical Impressions(s) / ED Diagnoses   Final diagnoses:  Viral upper respiratory tract infection    ED Discharge Orders    None       Gilda CreasePollina, Moritz Lever J, MD 10/19/17 [phone]

## 2017-11-02 ENCOUNTER — Other Ambulatory Visit: Payer: Self-pay

## 2017-11-02 ENCOUNTER — Emergency Department (HOSPITAL_COMMUNITY)
Admission: EM | Admit: 2017-11-02 | Discharge: 2017-11-02 | Disposition: A | Discharge status: Home / Self Care | Payer: Self-pay | Attending: Emergency Medicine | Admitting: Emergency Medicine

## 2017-11-02 DIAGNOSIS — F17210 Nicotine dependence, cigarettes, uncomplicated: Secondary | ICD-10-CM | POA: Insufficient documentation

## 2017-11-02 DIAGNOSIS — M25511 Pain in right shoulder: Secondary | ICD-10-CM | POA: Insufficient documentation

## 2017-11-02 DIAGNOSIS — Z91010 Allergy to peanuts: Secondary | ICD-10-CM | POA: Insufficient documentation

## 2017-11-02 DIAGNOSIS — Z91040 Latex allergy status: Secondary | ICD-10-CM | POA: Insufficient documentation

## 2017-11-02 MED ORDER — ACETAMINOPHEN 325 MG PO TABS
650.0000 mg | ORAL_TABLET | Freq: Once | ORAL | Status: AC
  Administered 2017-11-02: 650 mg via ORAL
  Filled 2017-11-02: qty 2

## 2017-11-02 NOTE — ED Triage Notes (Addendum)
Pt sts he is chronically ill, has not been feeling well, and has back pain but that the back isn't the true location. Later when asked location of pain pt pointed to penis and says that his head hurts when he breathes. Pt uncooperative to provide other pertinent information in triage other than "sometimes I come here to see pretty girls."

## 2017-11-02 NOTE — ED Provider Notes (Signed)
[place] EMERGENCY DEPARTMENT Provider Note   CSN: [id number] Arrival date & time: 11/02/17  4098     History   Chief Complaint No chief complaint on file.   HPI Marc Hart is a 27 y.o. male.  HPI   27 year old male with history of schizophrenia, and history of polysubstance abuse presenting with out any specific complaint.  It was difficult to obtain history from patient.  Patient mentioned he is not feeling well.  To the nurse, he mention having back pain but states that he does not back pain.  He also mentioned to the nurse that he has headache whenever he breathes but denies any headache currently.  Patient mentioned he has a keep both of his elbow away from his body in order to prevent having pain.  He denies any specific injury.  Currently he requesting for Tylenol for his right shoulder and states he always has pain in the shoulder.  He also mention "sometimes I come here to see pretty girls" to the nursing staff.  He currently denies SI or HI.  He mentioned being kicked out of [employer] 2 weeks ago for fighting but denies injuring his shoulder.  Past Medical History:  Diagnosis Date  . Schizophrenia ([place])   . Substance abuse [facility])     Patient Active Problem List   Diagnosis Date Noted  . Depression   . Polysubstance abuse ([place])   . Substance induced mood disorder ([place]) 09/25/2015  . Alcohol dependence with uncomplicated withdrawal ([place]) 09/16/2015  . Alcohol-induced mood disorder ([place]) 09/16/2015  . Depressive disorder 09/12/2015  . History of schizophrenia   . Schizoaffective disorder ([place]) 12/18/2014    Past Surgical History:  Procedure Laterality Date  . OTHER SURGICAL HISTORY     Ear Surgery        Home Medications    Prior to Admission medications   Medication Sig Start Date End Date Taking? Authorizing Provider  doxycycline (VIBRAMYCIN) 100 MG capsule Take 1 capsule (100 mg total) by mouth 2 (two) times daily. 10/01/17   Caccavale,  Sophia, PA-C    Family History No family history on file.  Social History Social History   Tobacco Use  . Smoking status: Current Some Day Smoker  . Smokeless tobacco: Never Used  Substance Use Topics  . Alcohol use: Yes  . Drug use: Yes    Types: Marijuana, IV, Cocaine    Comment: Reports Marijuana use today. Last use of cocaine yesterday 09/03/2017     Allergies   Amoxicillin; Amoxicillin; Ibuprofen; Peanuts [peanut oil]; Ibuprofen; Latex; Other; Peanut-containing drug products; Tylenol [acetaminophen]; Other; and Tylenol [acetaminophen]   Review of Systems Review of Systems  Constitutional: Negative for fever.  Respiratory: Negative for shortness of breath.   Cardiovascular: Negative for chest pain.  Gastrointestinal: Negative for abdominal pain.  Psychiatric/Behavioral: Negative for suicidal ideas.     Physical Exam Updated Vital Signs BP (!) 142/81 (BP Location: Right Arm)   Pulse 88   Temp 98.5 F (36.9 C) (Oral)   Resp 20   SpO2 97%   Physical Exam  Constitutional: He appears well-developed and well-nourished. No distress.  Patient appears in no acute discomfort, eating a bag of chips, and conversant.  HENT:  Head: Atraumatic.  Eyes: Conjunctivae are normal.  Neck: Neck supple.  Cardiovascular: Normal rate and regular rhythm.  Pulmonary/Chest: Effort normal and breath sounds normal.  Musculoskeletal:  Right shoulder: Shoulder is nontender on palpation, no obvious deformity, full range of motion  noted.  Right elbow and right wrist with full range of motion.  No pain to his neck or upper back.  Neurological: He is alert.  Skin: No rash noted.  Psychiatric: His affect is inappropriate. His speech is tangential. He expresses no homicidal and no suicidal ideation.  Nursing note and vitals reviewed.    ED Treatments / Results  Labs (all labs ordered are listed, but only abnormal results are displayed) Labs Reviewed - No data to display  EKG  EKG  Interpretation None       Radiology No results found.  Procedures Procedures (including critical care time)  Medications Ordered in ED Medications - No data to display   Initial Impression / Assessment and Plan / ED Course  I have reviewed the triage vital signs and the nursing notes.  Pertinent labs & imaging results that were available during my care of the patient were reviewed by me and considered in my medical decision making (see chart for details).     BP (!) 142/81 (BP Location: Right Arm)   Pulse 88   Temp 98.5 F (36.9 C) (Oral)   Resp 20   SpO2 97%    Final Clinical Impressions(s) / ED Diagnoses   Final diagnoses:  Right shoulder pain, unspecified chronicity    ED Discharge Orders    None     7:01 AM Patient with schizophrenia here with a myriad of random complaints.  He requesting for Tylenol for his shoulder pain.  He does not have any signs of injury noted on initial exam, both shoulders with full range of motion and nontender.  He is breathing appropriately, does not have any SI or HI.  At this time, I am unable to identify any other acute emergent medical condition.  Tylenol given.  Patient otherwise stable for discharge.   Domenic Moras, PA-C 11/02/17 0703    Carmin Muskrat, MD 11/03/17 [phone]

## 2017-11-02 NOTE — ED Notes (Signed)
Patient verbalizes understanding of discharge instructions. Opportunity for questioning and answers were provided. Armband removed by staff, pt discharged from ED.  

## 2017-12-08 ENCOUNTER — Encounter (HOSPITAL_COMMUNITY): Payer: Self-pay

## 2017-12-08 ENCOUNTER — Emergency Department (HOSPITAL_COMMUNITY)
Admission: EM | Admit: 2017-12-08 | Discharge: 2017-12-08 | Discharge status: Against Medical Advice | Payer: Self-pay | Attending: Emergency Medicine | Admitting: Emergency Medicine

## 2017-12-08 ENCOUNTER — Other Ambulatory Visit: Payer: Self-pay

## 2017-12-08 DIAGNOSIS — Z91040 Latex allergy status: Secondary | ICD-10-CM | POA: Insufficient documentation

## 2017-12-08 DIAGNOSIS — Z91010 Allergy to peanuts: Secondary | ICD-10-CM | POA: Insufficient documentation

## 2017-12-08 DIAGNOSIS — F419 Anxiety disorder, unspecified: Secondary | ICD-10-CM | POA: Insufficient documentation

## 2017-12-08 DIAGNOSIS — F209 Schizophrenia, unspecified: Secondary | ICD-10-CM | POA: Insufficient documentation

## 2017-12-08 DIAGNOSIS — R51 Headache: Secondary | ICD-10-CM | POA: Insufficient documentation

## 2017-12-08 DIAGNOSIS — F329 Major depressive disorder, single episode, unspecified: Secondary | ICD-10-CM | POA: Insufficient documentation

## 2017-12-08 DIAGNOSIS — F17210 Nicotine dependence, cigarettes, uncomplicated: Secondary | ICD-10-CM | POA: Insufficient documentation

## 2017-12-08 NOTE — ED Triage Notes (Signed)
Pt reports he has been having anxiety and headaches. Pt states he was taking haldol and cogentin and had extreme weight loss so he stopped the meds.

## 2017-12-08 NOTE — ED Provider Notes (Signed)
[facility] EMERGENCY DEPARTMENT Provider Note  CSN: [id number] Arrival date & time: 12/08/17 1415  Chief Complaint(s) Anxiety  HPI Marc Hart is a 27 y.o. male   The history is provided by the patient.  Anxiety  This is a chronic problem. Episode onset: several months. The problem occurs constantly. Progression since onset: fluctuating. Associated symptoms include headaches. Exacerbated by: social situations. Nothing relieves the symptoms. He has tried nothing for the symptoms.    Past Medical History Past Medical History:  Diagnosis Date  . Schizophrenia (HCC)   . Substance abuse [facility]    Patient Active Problem List   Diagnosis Date Noted  . Depression   . Polysubstance abuse (HCC)   . Substance induced mood disorder (HCC) 09/25/2015  . Alcohol dependence with uncomplicated withdrawal (HCC) 09/16/2015  . 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 Depressive disorder 09/12/2015  . History of schizophrenia   . Schizoaffective disorder (HCC) 12/18/2014   Home Medication(s) Prior to Admission medications   Medication Sig Start Date End Date Taking? Authorizing Provider  doxycycline (VIBRAMYCIN) 100 MG capsule Take 1 capsule (100 mg total) by mouth 2 (two) times daily. 10/01/17   Alveria Apley, PA-C                                                                                                                                    Past Surgical History Past Surgical History:  Procedure Laterality Date  . OTHER SURGICAL HISTORY     Ear Surgery    Family History History reviewed. No pertinent family history.  Social History Social History   Tobacco Use  . Smoking status: Current Some Day Smoker  . Smokeless tobacco: Never Used  Substance Use Topics  . Alcohol use: Yes  . Drug use: Yes    Types: Marijuana, IV, Cocaine    Comment: Reports Marijuana use today. Last use of cocaine yesterday 12/07/17   Allergies Amoxicillin; Amoxicillin;  Ibuprofen; Peanuts [peanut oil]; Ibuprofen; Latex; Other; Peanut-containing drug products; Tylenol [acetaminophen]; Other; and Tylenol [acetaminophen]  Review of Systems Review of Systems  Constitutional: Positive for appetite change and fatigue.  Neurological: Positive for headaches.   All other systems are reviewed and are negative for acute change except as noted in the HPI  Physical Exam Vital Signs  I have reviewed the triage vital signs BP (!) 142/76 (BP Location: Right Arm)   Pulse 74   Temp (!) 97.5 F (36.4 C) (Oral)   Resp 14   Ht 5\' 11"  (1.803 m)   Wt 81.6 kg (180 lb)   SpO2 100%   BMI 25.10 kg/m   Physical Exam  Constitutional: He is oriented to person, place, and time. He appears well-developed and well-nourished. No distress.  HENT:  Head: Normocephalic and atraumatic.  Nose: Nose normal.  Eyes: Pupils are equal, round, and reactive to light. Conjunctivae and EOM are normal. Right eye exhibits no  discharge. Left eye exhibits no discharge. No scleral icterus.  Neck: Normal range of motion. Neck supple.  Cardiovascular: Normal rate and regular rhythm. Exam reveals no gallop and no friction rub.  No murmur heard. Pulmonary/Chest: Effort normal and breath sounds normal. No stridor. No respiratory distress. He has no rales.  Abdominal: Soft. He exhibits no distension. There is no tenderness.  Musculoskeletal: He exhibits no edema or tenderness.  Neurological: He is alert and oriented to person, place, and time.  Skin: Skin is warm and dry. No rash noted. He is not diaphoretic. No erythema.  Psychiatric: He has a normal mood and affect. His behavior is normal. His speech is tangential. Thought content is not paranoid and not delusional. He expresses no homicidal and no suicidal ideation.  Vitals reviewed.   ED Results and Treatments Labs (all labs ordered are listed, but only abnormal results are displayed) Labs Reviewed - No data to display                                                                                                                        EKG  EKG Interpretation  Date/Time:    Ventricular Rate:    PR Interval:    QRS Duration:   QT Interval:    QTC Calculation:   R Axis:     Text Interpretation:        Radiology No results found. Pertinent labs & imaging results that were available during my care of the patient were reviewed by me and considered in my medical decision making (see chart for details).  Medications Ordered in ED Medications - No data to display                                                                                                                                  Procedures Procedures  (including critical care time)  Medical Decision Making / ED Course I have reviewed the nursing notes for this encounter and the patient's prior records (if available in EHR or on provided paperwork).    Will get screening labs and consult [facility]. No SI, HI, or AVH.   [facility]  Final Clinical Impression(s) / ED Diagnoses Final diagnoses:  Anxiety      This chart was dictated using voice recognition software.  Despite best efforts to proofread,  errors can occur which can change the documentation meaning.  Nira Conn, MD 12/09/17 [phone]

## 2017-12-08 NOTE — ED Notes (Signed)
Patient left and didn't inform staff he was leaving ED.

## 2017-12-20 ENCOUNTER — Encounter (HOSPITAL_COMMUNITY): Payer: Self-pay | Admitting: Emergency Medicine

## 2017-12-20 ENCOUNTER — Other Ambulatory Visit: Payer: Self-pay

## 2017-12-20 ENCOUNTER — Emergency Department (HOSPITAL_COMMUNITY)
Admission: EM | Admit: 2017-12-20 | Discharge: 2017-12-23 | Disposition: A | Discharge status: Home / Self Care | Payer: Self-pay | Attending: Emergency Medicine | Admitting: Emergency Medicine

## 2017-12-20 ENCOUNTER — Emergency Department (HOSPITAL_COMMUNITY)
Admission: EM | Admit: 2017-12-20 | Discharge: 2017-12-20 | Disposition: A | Discharge status: Home / Self Care | Payer: Self-pay | Attending: Emergency Medicine | Admitting: Emergency Medicine

## 2017-12-20 DIAGNOSIS — F17200 Nicotine dependence, unspecified, uncomplicated: Secondary | ICD-10-CM | POA: Insufficient documentation

## 2017-12-20 DIAGNOSIS — R456 Violent behavior: Secondary | ICD-10-CM | POA: Insufficient documentation

## 2017-12-20 DIAGNOSIS — F209 Schizophrenia, unspecified: Secondary | ICD-10-CM | POA: Insufficient documentation

## 2017-12-20 DIAGNOSIS — Z5321 Procedure and treatment not carried out due to patient leaving prior to being seen by health care provider: Secondary | ICD-10-CM | POA: Insufficient documentation

## 2017-12-20 DIAGNOSIS — F28 Other psychotic disorder not due to a substance or known physiological condition: Secondary | ICD-10-CM

## 2017-12-20 DIAGNOSIS — Z79899 Other long term (current) drug therapy: Secondary | ICD-10-CM | POA: Insufficient documentation

## 2017-12-20 DIAGNOSIS — R45851 Suicidal ideations: Secondary | ICD-10-CM | POA: Insufficient documentation

## 2017-12-20 DIAGNOSIS — Z91040 Latex allergy status: Secondary | ICD-10-CM | POA: Insufficient documentation

## 2017-12-20 LAB — COMPREHENSIVE METABOLIC PANEL
ALT: 19 U/L (ref 17–63)
AST: 25 U/L (ref 15–41)
Albumin: 4.5 g/dL (ref 3.5–5.0)
Alkaline Phosphatase: 119 U/L (ref 38–126)
Anion gap: 12 (ref 5–15)
BUN: 12 mg/dL (ref 6–20)
CHLORIDE: 106 mmol/L (ref 101–111)
CO2: 21 mmol/L — AB (ref 22–32)
CREATININE: 1.1 mg/dL (ref 0.61–1.24)
Calcium: 9.1 mg/dL (ref 8.9–10.3)
GFR calc non Af Amer: >60 mL/min (ref >60)
Glucose, Bld: 98 mg/dL (ref 65–99)
POTASSIUM: 3.3 mmol/L — AB (ref 3.5–5.1)
SODIUM: 139 mmol/L (ref 135–145)
Total Bilirubin: 1 mg/dL (ref 0.3–1.2)
Total Protein: 7.5 g/dL (ref 6.5–8.1)

## 2017-12-20 LAB — CBC
HCT: 45.6 % (ref 39.0–52.0)
HEMOGLOBIN: 16 g/dL (ref 13.0–17.0)
MCH: 30.7 pg (ref 26.0–34.0)
MCHC: 35.1 g/dL (ref 30.0–36.0)
MCV: 87.4 fL (ref 78.0–100.0)
Platelets: 238 10*3/uL (ref 150–400)
RBC: 5.22 MIL/uL (ref 4.22–5.81)
RDW: 12.9 % (ref 11.5–15.5)
WBC: 8.7 10*3/uL (ref 4.0–10.5)

## 2017-12-20 LAB — SALICYLATE LEVEL

## 2017-12-20 LAB — ETHANOL: Alcohol, Ethyl (B): <10 mg/dL (ref <10)

## 2017-12-20 LAB — ACETAMINOPHEN LEVEL: Acetaminophen (Tylenol), Serum: <10 ug/mL — ABNORMAL LOW (ref 10–30)

## 2017-12-20 MED ORDER — OLANZAPINE 5 MG PO TABS
5.0000 mg | ORAL_TABLET | Freq: Every day | ORAL | Status: DC
  Administered 2017-12-20 – 2017-12-22 (×3): 5 mg via ORAL
  Filled 2017-12-20 (×3): qty 1

## 2017-12-20 MED ORDER — DIPHENHYDRAMINE HCL 50 MG/ML IJ SOLN
50.0000 mg | Freq: Once | INTRAMUSCULAR | Status: AC
  Administered 2017-12-21: 50 mg via INTRAVENOUS
  Filled 2017-12-20: qty 1

## 2017-12-20 MED ORDER — ZIPRASIDONE MESYLATE 20 MG IM SOLR
20.0000 mg | INTRAMUSCULAR | Status: AC | PRN
  Administered 2017-12-20: 20 mg via INTRAMUSCULAR
  Filled 2017-12-20: qty 20

## 2017-12-20 MED ORDER — OLANZAPINE 5 MG PO TBDP
10.0000 mg | ORAL_TABLET | Freq: Three times a day (TID) | ORAL | Status: DC | PRN
  Administered 2017-12-20 – 2017-12-22 (×3): 10 mg via ORAL
  Filled 2017-12-20 (×3): qty 2

## 2017-12-20 MED ORDER — LORAZEPAM 2 MG/ML IJ SOLN
1.0000 mg | Freq: Once | INTRAMUSCULAR | Status: AC
  Administered 2017-12-21: 1 mg via INTRAVENOUS
  Filled 2017-12-20: qty 1

## 2017-12-20 MED ORDER — HALOPERIDOL LACTATE 5 MG/ML IJ SOLN
1.0000 mg | Freq: Once | INTRAMUSCULAR | Status: AC
  Administered 2017-12-21: 1 mg via INTRAMUSCULAR
  Filled 2017-12-20: qty 1

## 2017-12-20 NOTE — ED Notes (Signed)
[facility]; Copy of IVC placed in Medical Records and original placed in red folder in Med room on PODF-Monique,RN

## 2017-12-20 NOTE — ED Notes (Signed)
Pt sleeping soundly at this time. NAD. Will continue to monitor.

## 2017-12-20 NOTE — ED Notes (Signed)
TTS in process-Monique,RN  

## 2017-12-20 NOTE — ED Provider Notes (Signed)
[facility] EMERGENCY [facility]PARTMENT Provider Note   CSN: [id number] Arrival date & time: 12/20/17  0249     History   Chief Complaint Chief Complaint  Patient presents with  . Medical Clearance    HPI Marc Hart is a 27 y.o. male.  Comes to the ER under unclear circumstances.  Patient clearly paranoid and delusional, likely hallucinating.  He is extremely disorganized, cannot follow any commands or be conversational.  Extremely tangential.  During evaluation, patient got up and ran out of the evaluation room.  He went outside the hospital and jumped into traffic.  He was apprehended by the police and brought back into the ER for further evaluation.  Patient is too disorganized to answer questions. Level V Caveat due to psychiatric condition.     Past Medical History:  Diagnosis Date  . Schizophrenia (HCC)   . Substance abuse [facility](HCC)     Patient Active Problem List   Diagnosis Date Noted  . Depression   . Polysubstance abuse (HCC)   . Substance induced mood disorder (HCC) 09/25/2015  . Alcohol dependence with uncomplicated withdrawal (HCC) 09/16/2015  . 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 Depressive disorder 09/12/2015  . History of schizophrenia   . Schizoaffective disorder (HCC) 12/18/2014    Past Surgical History:  Procedure Laterality Date  . OTHER SURGICAL HISTORY     Ear Surgery         Home Medications    Prior to Admission medications   Medication Sig Start Date End Date Taking? Authorizing Provider  doxycycline (VIBRAMYCIN) 100 MG capsule Take 1 capsule (100 mg total) by mouth 2 (two) times daily. 10/01/17   Caccavale, Sophia, PA-C    Family History No family history on file.  Social History Social History   Tobacco Use  . Smoking status: Current Some Day Smoker  . Smokeless tobacco: Never Used  Substance Use Topics  . Alcohol use: Yes  . Drug use: Yes    Types: Marijuana, IV, Cocaine    Comment: Reports  Marijuana use today. Last use of cocaine yesterday 12/07/17     Allergies   Amoxicillin; Amoxicillin; Ibuprofen; Peanuts [peanut oil]; Ibuprofen; Latex; Other; Peanut-containing drug products; Tylenol [acetaminophen]; Other; and Tylenol [acetaminophen]   Review of Systems Review of Systems  Unable to perform ROS: Psychiatric disorder     Physical Exam Updated Vital Signs There were no vitals taken for this visit.  Physical Exam  Constitutional: He is oriented to person, place, and time. He appears well-developed and well-nourished. No distress.  HENT:  Head: Normocephalic and atraumatic.  Right Ear: Hearing normal.  Left Ear: Hearing normal.  Nose: Nose normal.  Mouth/Throat: Oropharynx is clear and moist and mucous membranes are normal.  Eyes: Pupils are equal, round, and reactive to light. Conjunctivae and EOM are normal.  Neck: Normal range of motion. Neck supple.  Cardiovascular: Regular rhythm, S1 normal and S2 normal. Exam reveals no gallop and no friction rub.  No murmur heard. Pulmonary/Chest: Effort normal and breath sounds normal. No respiratory distress. He exhibits no tenderness.  Abdominal: Soft. Normal appearance and bowel sounds are normal. There is no hepatosplenomegaly. There is no tenderness. There is no rebound, no guarding, no tenderness at McBurney's point and negative Murphy's sign. No hernia.  Musculoskeletal: Normal range of motion.  Neurological: He is alert and oriented to person, place, and time. He has normal strength. No cranial nerve deficit or sensory deficit. Coordination normal. GCS eye subscore is 4.  GCS verbal subscore is 5. GCS motor subscore is 6.  Skin: Skin is warm, dry and intact. No rash noted. No cyanosis.  Psychiatric: His affect is inappropriate. His speech is rapid and/or pressured and tangential. He is agitated, aggressive and hyperactive. Thought content is paranoid and delusional. He expresses suicidal ideation.  Nursing note and  vitals reviewed.    ED Treatments / Results  Labs (all labs ordered are listed, but only abnormal results are displayed) Labs Reviewed  COMPREHENSIVE METABOLIC PANEL  ETHANOL  SALICYLATE LEVEL  ACETAMINOPHEN LEVEL  CBC  RAPID URINE DRUG SCREEN, HOSP PERFORMED    EKG None  Radiology No results found.  Procedures Procedures (including critical care time)  Medications Ordered in ED Medications - No data to display   Initial Impression / Assessment and Plan / ED Course  I have reviewed the triage vital signs and the nursing notes.  Pertinent labs & imaging results that were available during my care of the patient were reviewed by me and considered in my medical decision making (see chart for details).     Patient with previous psychiatric and drug abuse history presents with obvious psychosis.  He will require psychiatric evaluation.  Final Clinical Impressions(s) / ED Diagnoses   Final diagnoses:  Other psychotic disorder not due to substance or known physiological condition [facility])    ED Discharge Orders    None       [place] Scheiderer, Canary Brim, MD 12/20/17 [phone]

## 2017-12-20 NOTE — ED Notes (Signed)
Regular Diet was ordered for Lunch. 

## 2017-12-20 NOTE — ED Triage Notes (Signed)
Pt BIB [employer] after pt was found running down the street. Dr. Blinda LeatherwoodPollina aware of pt.

## 2017-12-20 NOTE — ED Notes (Signed)
Patient is now fully and asking about length of stay; [facility] to have TTS assessement completed while pt awake; Pt has concerns about medication ordered-Monique,RN

## 2017-12-20 NOTE — BH Assessment (Signed)
Clinician called pt's nurse to check in and see if pt was awake and could be assessed. Nurse stated the pt woke up took a shower and went back to sleep. Pt's nurse stated he would contact clinician once pt was awake at [phone].

## 2017-12-20 NOTE — ED Notes (Signed)
Dr. Blinda LeatherwoodPollina with pt.

## 2017-12-20 NOTE — ED Notes (Signed)
Pt laying in bed with eyes closed. One wrist removed from handcuffs.
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
## 2017-12-20 NOTE — ED Notes (Signed)
Charge RN present with pt and provider, pt denies SI/HI and has decided to leave.

## 2017-12-20 NOTE — ED Notes (Signed)
Staffing notified for sitter 
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
## 2017-12-20 NOTE — ED Notes (Signed)
Breakfast tray ordered. Pt still sleeping soundly.

## 2017-12-20 NOTE — BH Assessment (Signed)
Unable to complete assessment at this time. Pt is refusing to answer questions and has been given Geodon.

## 2017-12-20 NOTE — BH Assessment (Signed)
  TTS called to complete assessment; RN Toniann FailWendy informed me that pt is still sleeping. TTS will call back to see if pt is awake and able to be assessed.
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
## 2017-12-20 NOTE — BH Assessment (Signed)
This clinician called in to assess patient and nurse informed clinician pt was still resting and had inappropriate behaviors and had not had medication that was recommended by provider. Clinician and nurse agreed to wait a little longer and clinician would check back at 3:00 PM and nurse would contact clinician if pt woke up and took medication and was manageable before that time.

## 2017-12-20 NOTE — ED Triage Notes (Signed)
Pt presents to ED, hallucinating, being verbally aggressive with staff. Pt delusional, denies SI/HI.

## 2017-12-20 NOTE — ED Notes (Signed)
[facility] re: [facility] and discussed plan, pt asleep and will have TTS when pt wakes up, meds to be administered once pt is awake and has completed TTS
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
## 2017-12-20 NOTE — Progress Notes (Signed)
Pt is recommended for inpt treatment. Pt's nurse Janett Labellaavis, Shorbyshawron M, RN and EDP Dr. Rennis ChrisJacobowitz, MD has been advised of the disposition. TTS to seek placement.  Princess BruinsAquicha Sharnita Bogucki, MSW, LCSW Therapeutic Triage Specialist  [phone]

## 2017-12-20 NOTE — Consult Note (Addendum)
  Medication Review    Marc Hart, 27 y.o., male [facility] with unclear complaints; but noted disorganization and  hallucination.  Patient's medication and chart reviewed by this provider;  and consulted with Dr. Lucianne MussKumar on 12/20/17.  Current Psychotropic Medication: Zyprexa Zydis 10 mg Q 8 hr prn agitation with Geodon 20 mg IM prn agitation   Medication recommendation: Start Zyprexa Zydis 5 mg daily Stop Zyprexa zydis/Geodon prn For agitation can use Haldol 1 mg/Ativan 1 mg/Benadry 50 mg IM prn Q 8 hr for agitation Will need to get a current 12 lead EKG to rule QT   Spoke with Dr Dalene SeltzerSchlossman informed of the above medication recommendations   Shuvon B. Rankin FNP-BC

## 2017-12-20 NOTE — ED Notes (Signed)
Patient was given a Ginger Ale and Cookies  For Snack.

## 2017-12-21 LAB — RAPID URINE DRUG SCREEN, HOSP PERFORMED
AMPHETAMINES: NOT DETECTED
BARBITURATES: NOT DETECTED
Benzodiazepines: NOT DETECTED
COCAINE: NOT DETECTED
Opiates: NOT DETECTED
TETRAHYDROCANNABINOL: POSITIVE — AB
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
## 2017-12-21 NOTE — ED Notes (Signed)
Pt up to restroom.

## 2017-12-21 NOTE — ED Notes (Signed)
Pt yelling loudly - stating "I'm fucking dehydrated!" Pt has a cup of water and cup of ice at bedside. Pt stating he does not want to talk w/Sitter and RN - calling staff names - stating "You're ugly and I need you to get out of my room!". I am leaving. I brought myself in and I can leave. Advised pt he is under IVC. Pt states "I don't care. I'll just leave!" Security and [employer] called to assist. Pt given injections. Pt tolerated well - advised RN he only wanted them in his right anterior thigh. Security stood by. Pt given another cup of water.

## 2017-12-21 NOTE — ED Notes (Signed)
Patient denies pain and is resting comfortably.  

## 2017-12-21 NOTE — ED Notes (Signed)
Pt walking around in his room - states "I just had to stand up for a minute".

## 2017-12-21 NOTE — Progress Notes (Signed)
[facility]  Ilean SkillMeghan Olivia Pavelko, MSW, [place] Clinical Social Work 12/21/2017 [phone]

## 2017-12-21 NOTE — ED Notes (Signed)
Pt voiced understanding of need for urine specimen - labeled specimen cup placed at bedside.

## 2017-12-21 NOTE — ED Notes (Signed)
Charge RN aware Child psychotherapisttaffing Office does not have Sitter available for pt. Pt being monitored by staff.

## 2017-12-21 NOTE — ED Notes (Signed)
Snack given.

## 2017-12-22 NOTE — BH Assessment (Signed)
12/22/2017: Marc Rankin NP recommends inpatient treatment. Patient referred to the following hospitals for [facility]: [facility], [facility], [facility], [facility], [address], [facility], [facility], and [facility] [facility] [place].

## 2017-12-22 NOTE — ED Notes (Signed)
Pt up to restroom. Calm and cooperative.  

## 2017-12-22 NOTE — ED Notes (Signed)
Snack and drink given. Pt pleasant and cooperative.

## 2017-12-22 NOTE — ED Notes (Signed)
Pt was given 2 graham cracker packets, 2 peanut butter cups and 1 cup of spirit.

## 2017-12-22 NOTE — BH Assessment (Signed)
Contacted pt to determine if he continues to meet the criteria for inpatient hospitalization. Pt denies SI and HI and states he "just wants to go home." Pt states he has been sleeping good and that his appetite has been "ok," stating that the food at the hospital is "not the best." Clinician inquired as to whether pt has been/is currently on medication; pt became agitated at this point, stating that the questions clinician were not important and that all clinician needed to know was whether he was feeling like he was going to hurt himself or someone else and that he had already stated that he was not going to. Clinician stated that the questions posed were used to assist in determining how to best identify what pt's needs are and how those needs might best be met, so pt does not have to answer them & it can simply be shared that he chose not to cooperate. Pt argued that clinician could go ahead and ask her questions, so clinician again asked her questions. Pt became angry when clinician posed the questions, as evidenced by pt pausing before answering, scowling at clinician, and gritting his teeth. Pt stated he is not currently working, he lives alone, he is not currently on medication, and he does not currently have a therapist nor a psychiatrist. Pt was on-edge and highly reactive throughout the assessment.

## 2017-12-22 NOTE — ED Notes (Signed)
Pt sleeping. NAD noted. Will continue to monitor. Sitter at bedside.

## 2017-12-22 NOTE — ED Notes (Signed)
Pt was given graham crackers, peanut butter and caffeine free coke. Pt said thank you

## 2017-12-22 NOTE — ED Notes (Signed)
TTS completed. 

## 2017-12-22 NOTE — ED Notes (Signed)
Patient was given [place] CloudGraham Crackers. Peanut Butter and Drink  For snack.

## 2017-12-22 NOTE — ED Notes (Signed)
Pt used his last phone call of the day

## 2017-12-22 NOTE — ED Notes (Signed)
Pt cooperative and calm at this time.

## 2017-12-22 NOTE — ED Notes (Addendum)
Sitter arrived. Pt eating snack given.

## 2017-12-22 NOTE — ED Notes (Signed)
Pt awake now - eating breakfast.

## 2017-12-22 NOTE — ED Notes (Signed)
Regular Diet was ordered for Dinner. 

## 2017-12-22 NOTE — ED Notes (Addendum)
Pt was given a cup of water.  Pt used the restroom and placed order for dinner.

## 2017-12-23 ENCOUNTER — Inpatient Hospital Stay (HOSPITAL_COMMUNITY)
Admission: AD | Admit: 2017-12-23 | Payer: No Typology Code available for payment source | Source: Intra-hospital | Admitting: Psychiatry

## 2017-12-23 NOTE — Progress Notes (Signed)
Patient seen by me via telepsych and I consulted with Dr. Lucianne MussKumar. Patient is cleared by psychiatry and does not meet criteria for inpatient treatment. Patient denies any SI/HI/AVH and contracts for safety. [facility].

## 2017-12-23 NOTE — Discharge Instructions (Addendum)
[facility]

## 2017-12-23 NOTE — ED Notes (Signed)
Pt sleeping soundly - NAD.

## 2017-12-23 NOTE — ED Notes (Signed)
Pt cooperative and calm throughout the evening.

## 2017-12-23 NOTE — BH Assessment (Addendum)
[facility] Assessment Progress Note  Pt reassessed today. Pt denies SI, HI AVH. He reports that if d/c, he will go home to his apartment. 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 Pt also reports that he has a supportive family. Pt is calm and cooperative during assessment. Pt is able to appropriately attend to questions. He does not appear to be responding to internal stimuli.   Staffed case with [name], NP, who also reassessed pt. Case staffed with Dr. [name] for final disposition. Pt is recommended to be d/c and f/u [facility]. Pt's RN, [name], and EDP [name] notified of disposition.   Marc ShockSamantha M. Ladona Ridgelaylor, MS, NCC, LPCA Counselor

## 2017-12-23 NOTE — ED Notes (Signed)
Breakfast tray ordered 

## 2018-01-02 ENCOUNTER — Encounter (HOSPITAL_COMMUNITY): Payer: Self-pay | Admitting: Emergency Medicine

## 2018-01-02 ENCOUNTER — Other Ambulatory Visit: Payer: Self-pay

## 2018-01-02 ENCOUNTER — Emergency Department (HOSPITAL_COMMUNITY)
Admission: EM | Admit: 2018-01-02 | Discharge: 2018-01-02 | Disposition: A | Discharge status: Home / Self Care | Payer: Self-pay | Attending: Emergency Medicine | Admitting: Emergency Medicine

## 2018-01-02 DIAGNOSIS — F17200 Nicotine dependence, unspecified, uncomplicated: Secondary | ICD-10-CM | POA: Insufficient documentation

## 2018-01-02 DIAGNOSIS — Z79899 Other long term (current) drug therapy: Secondary | ICD-10-CM | POA: Insufficient documentation

## 2018-01-02 DIAGNOSIS — F1910 Other psychoactive substance abuse, uncomplicated: Secondary | ICD-10-CM | POA: Insufficient documentation

## 2018-01-02 DIAGNOSIS — Z91040 Latex allergy status: Secondary | ICD-10-CM | POA: Insufficient documentation

## 2018-01-02 LAB — RAPID URINE DRUG SCREEN, HOSP PERFORMED
Amphetamines: NOT DETECTED
BARBITURATES: NOT DETECTED
Benzodiazepines: POSITIVE — AB
Cocaine: POSITIVE — AB
Opiates: NOT DETECTED
Tetrahydrocannabinol: POSITIVE — AB

## 2018-01-02 LAB — COMPREHENSIVE METABOLIC PANEL
ALK PHOS: 66 U/L (ref 38–126)
ALT: 17 U/L (ref 17–63)
AST: 20 U/L (ref 15–41)
Albumin: 3.8 g/dL (ref 3.5–5.0)
Anion gap: 10 (ref 5–15)
BUN: 16 mg/dL (ref 6–20)
CALCIUM: 8.7 mg/dL — AB (ref 8.9–10.3)
CHLORIDE: 110 mmol/L (ref 101–111)
CO2: 23 mmol/L (ref 22–32)
CREATININE: 1.05 mg/dL (ref 0.61–1.24)
GFR calc Af Amer: >60 mL/min (ref >60)
Glucose, Bld: 109 mg/dL — ABNORMAL HIGH (ref 65–99)
Potassium: 3.4 mmol/L — ABNORMAL LOW (ref 3.5–5.1)
Sodium: 143 mmol/L (ref 135–145)
Total Bilirubin: 0.6 mg/dL (ref 0.3–1.2)
Total Protein: 6.6 g/dL (ref 6.5–8.1)

## 2018-01-02 LAB — CBC
HCT: 42.9 % (ref 39.0–52.0)
Hemoglobin: 14.9 g/dL (ref 13.0–17.0)
MCH: 30.6 pg (ref 26.0–34.0)
MCHC: 34.7 g/dL (ref 30.0–36.0)
MCV: 88.1 fL (ref 78.0–100.0)
PLATELETS: 238 10*3/uL (ref 150–400)
RBC: 4.87 MIL/uL (ref 4.22–5.81)
RDW: 12.9 % (ref 11.5–15.5)
WBC: 9.2 10*3/uL (ref 4.0–10.5)

## 2018-01-02 LAB — ETHANOL: ALCOHOL ETHYL (B): 57 mg/dL — AB (ref <10)

## 2018-01-02 MED ORDER — NICOTINE 21 MG/24HR TD PT24: 21.0000 mg | MEDICATED_PATCH | Freq: Every day | TRANSDERMAL | Status: DC

## 2018-01-02 MED ORDER — ONDANSETRON 8 MG PO TBDP
8.0000 mg | ORAL_TABLET | Freq: Once | ORAL | Status: DC
  Filled 2018-01-02: qty 1

## 2018-01-02 MED ORDER — ONDANSETRON HCL 4 MG PO TABS: 4.0000 mg | ORAL_TABLET | Freq: Three times a day (TID) | ORAL | Status: DC | PRN
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
## 2018-01-02 NOTE — ED Notes (Signed)
Bed: [facility] Expected date:  Expected time:  Means of arrival:  Comments: 
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
## 2018-01-02 NOTE — ED Notes (Signed)
PA made aware that pt refused Zofran at this time.
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
## 2018-01-02 NOTE — ED Triage Notes (Signed)
Pt presents by EMS for evaluation of psychiatric illness. EMS reports that he was speaking incoherently during transport. Pt having random conversations during triage but denies any SI/HI at this time.

## 2018-01-02 NOTE — ED Notes (Signed)
Pt refused Zofran ODT after PA witnessed pt having episode of emesis. Pt stated " I don't want your fucking drugs." Will continue to monitor.

## 2018-02-06 ENCOUNTER — Encounter (HOSPITAL_COMMUNITY): Payer: Self-pay | Admitting: Emergency Medicine

## 2018-02-06 ENCOUNTER — Emergency Department (HOSPITAL_COMMUNITY)
Admission: EM | Admit: 2018-02-06 | Discharge: 2018-02-07 | Disposition: A | Discharge status: Home / Self Care | Payer: Self-pay | Attending: Emergency Medicine | Admitting: Emergency Medicine

## 2018-02-06 DIAGNOSIS — Z91010 Allergy to peanuts: Secondary | ICD-10-CM | POA: Insufficient documentation

## 2018-02-06 DIAGNOSIS — Z91040 Latex allergy status: Secondary | ICD-10-CM | POA: Insufficient documentation

## 2018-02-06 DIAGNOSIS — F17200 Nicotine dependence, unspecified, uncomplicated: Secondary | ICD-10-CM | POA: Insufficient documentation

## 2018-02-06 DIAGNOSIS — R4182 Altered mental status, unspecified: Secondary | ICD-10-CM | POA: Insufficient documentation

## 2018-02-06 LAB — COMPREHENSIVE METABOLIC PANEL
ALBUMIN: 4.7 g/dL (ref 3.5–5.0)
ALT: 16 U/L — ABNORMAL LOW (ref 17–63)
AST: 23 U/L (ref 15–41)
Alkaline Phosphatase: 82 U/L (ref 38–126)
Anion gap: 12 (ref 5–15)
BUN: 11 mg/dL (ref 6–20)
CHLORIDE: 103 mmol/L (ref 101–111)
CO2: 26 mmol/L (ref 22–32)
Calcium: 9.3 mg/dL (ref 8.9–10.3)
Creatinine, Ser: 1.19 mg/dL (ref 0.61–1.24)
GFR calc Af Amer: >60 mL/min (ref >60)
GFR calc non Af Amer: >60 mL/min (ref >60)
GLUCOSE: 110 mg/dL — AB (ref 65–99)
POTASSIUM: 3.1 mmol/L — AB (ref 3.5–5.1)
Sodium: 141 mmol/L (ref 135–145)
Total Bilirubin: 1.5 mg/dL — ABNORMAL HIGH (ref 0.3–1.2)
Total Protein: 7.3 g/dL (ref 6.5–8.1)

## 2018-02-06 LAB — CBC WITH DIFFERENTIAL/PLATELET
ABS IMMATURE GRANULOCYTES: 0 10*3/uL (ref 0.0–0.1)
BASOS ABS: 0.1 10*3/uL (ref 0.0–0.1)
BASOS PCT: 1 %
Eosinophils Absolute: 0.1 10*3/uL (ref 0.0–0.7)
Eosinophils Relative: 1 %
HCT: 49.2 % (ref 39.0–52.0)
HEMOGLOBIN: 16.7 g/dL (ref 13.0–17.0)
IMMATURE GRANULOCYTES: 0 %
LYMPHS PCT: 18 %
Lymphs Abs: 2.2 10*3/uL (ref 0.7–4.0)
MCH: 29.8 pg (ref 26.0–34.0)
MCHC: 33.9 g/dL (ref 30.0–36.0)
MCV: 87.9 fL (ref 78.0–100.0)
MONOS PCT: 9 %
Monocytes Absolute: 1.2 10*3/uL — ABNORMAL HIGH (ref 0.1–1.0)
NEUTROS ABS: 9 10*3/uL — AB (ref 1.7–7.7)
NEUTROS PCT: 71 %
PLATELETS: 352 10*3/uL (ref 150–400)
RBC: 5.6 MIL/uL (ref 4.22–5.81)
RDW: 12.4 % (ref 11.5–15.5)
WBC: 12.6 10*3/uL — ABNORMAL HIGH (ref 4.0–10.5)

## 2018-02-06 LAB — ETHANOL

## 2018-02-06 LAB — SALICYLATE LEVEL: Salicylate Lvl: <7 mg/dL (ref 2.8–30.0)

## 2018-02-06 LAB — ACETAMINOPHEN LEVEL: Acetaminophen (Tylenol), Serum: <10 ug/mL — ABNORMAL LOW (ref 10–30)

## 2018-02-06 MED ORDER — ZIPRASIDONE MESYLATE 20 MG IM SOLR
20.0000 mg | Freq: Once | INTRAMUSCULAR | Status: AC
  Administered 2018-02-06: 20 mg via INTRAMUSCULAR
  Filled 2018-02-06: qty 20
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
## 2018-02-06 NOTE — ED Triage Notes (Signed)
Pt showed up at [facility] first. Speaking with word salad. Ambulatory with steady gait. Pt has psych history. Pt unable to answer simple questions. Tresa EndoKelly PA made aware.  AMS
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
## 2018-02-07 NOTE — ED Notes (Addendum)
Ambulated pt around the unit.

## 2018-02-07 NOTE — ED Notes (Addendum)
Bilateral ankle and wrist restraints discontinued. Pt cooperative and calm at this time.  Pt informed that if any violent behavior begins again he will have to be restrained.  Offered pt a drink and toileting. Skin free of injury.  Will continue to monitor.

## 2018-03-23 ENCOUNTER — Emergency Department (HOSPITAL_COMMUNITY)
Admission: EM | Admit: 2018-03-23 | Discharge: 2018-03-23 | Discharge status: Against Medical Advice | Payer: Self-pay | Attending: Emergency Medicine | Admitting: Emergency Medicine

## 2018-03-23 ENCOUNTER — Encounter (HOSPITAL_COMMUNITY): Payer: Self-pay

## 2018-03-23 DIAGNOSIS — Z7721 Contact with and (suspected) exposure to potentially hazardous body fluids: Secondary | ICD-10-CM | POA: Insufficient documentation

## 2018-03-23 DIAGNOSIS — Z5321 Procedure and treatment not carried out due to patient leaving prior to being seen by health care provider: Secondary | ICD-10-CM | POA: Insufficient documentation

## 2018-03-23 NOTE — ED Triage Notes (Signed)
Pt presents for evaluation related to potentially using IV drugs recently. Pt unable to tell me when the stick happened or what drugs were involved but stated he was thinking about using drugs for the first time and is now concerned that he has an illness related to IV drug use. Pt appears paranoid, stating he is "having a hard time with women today" and requesting a male nurse. Denies SI/HI.

## 2018-03-23 NOTE — ED Notes (Signed)
PT would like to leave as he doesn't want to be a bother. Explained to PT that he is about ready to be roomed. PT stated that he really wants to leave and that he doesn't need to be seen anymore. Removed arm band from PT arm.

## 2018-03-24 ENCOUNTER — Encounter (HOSPITAL_COMMUNITY): Payer: Self-pay | Admitting: Emergency Medicine

## 2018-03-24 ENCOUNTER — Emergency Department (HOSPITAL_COMMUNITY)
Admission: EM | Admit: 2018-03-24 | Discharge: 2018-03-24 | Disposition: A | Discharge status: Home / Self Care | Payer: Self-pay | Attending: Emergency Medicine | Admitting: Emergency Medicine

## 2018-03-24 ENCOUNTER — Other Ambulatory Visit: Payer: Self-pay

## 2018-03-24 DIAGNOSIS — Z91040 Latex allergy status: Secondary | ICD-10-CM | POA: Insufficient documentation

## 2018-03-24 DIAGNOSIS — F1920 Other psychoactive substance dependence, uncomplicated: Secondary | ICD-10-CM | POA: Insufficient documentation

## 2018-03-24 DIAGNOSIS — F1410 Cocaine abuse, uncomplicated: Secondary | ICD-10-CM | POA: Insufficient documentation

## 2018-03-24 DIAGNOSIS — F200 Paranoid schizophrenia: Secondary | ICD-10-CM | POA: Insufficient documentation

## 2018-03-24 DIAGNOSIS — Z79899 Other long term (current) drug therapy: Secondary | ICD-10-CM | POA: Insufficient documentation

## 2018-03-24 DIAGNOSIS — F1210 Cannabis abuse, uncomplicated: Secondary | ICD-10-CM | POA: Insufficient documentation

## 2018-03-24 DIAGNOSIS — F17200 Nicotine dependence, unspecified, uncomplicated: Secondary | ICD-10-CM | POA: Insufficient documentation

## 2018-03-24 DIAGNOSIS — F209 Schizophrenia, unspecified: Secondary | ICD-10-CM | POA: Insufficient documentation

## 2018-03-24 DIAGNOSIS — Z91010 Allergy to peanuts: Secondary | ICD-10-CM | POA: Insufficient documentation

## 2018-03-24 LAB — COMPREHENSIVE METABOLIC PANEL
ALT: 22 U/L (ref 0–44)
ANION GAP: 10 (ref 5–15)
AST: 25 U/L (ref 15–41)
Albumin: 4 g/dL (ref 3.5–5.0)
Alkaline Phosphatase: 72 U/L (ref 38–126)
BILIRUBIN TOTAL: 0.9 mg/dL (ref 0.3–1.2)
BUN: 8 mg/dL (ref 6–20)
CALCIUM: 9.2 mg/dL (ref 8.9–10.3)
CO2: 28 mmol/L (ref 22–32)
Chloride: 102 mmol/L (ref 98–111)
Creatinine, Ser: 1.05 mg/dL (ref 0.61–1.24)
GFR calc non Af Amer: >60 mL/min (ref >60)
Glucose, Bld: 116 mg/dL — ABNORMAL HIGH (ref 70–99)
POTASSIUM: 4.2 mmol/L (ref 3.5–5.1)
SODIUM: 140 mmol/L (ref 135–145)
TOTAL PROTEIN: 6.5 g/dL (ref 6.5–8.1)

## 2018-03-24 LAB — CBC
HEMATOCRIT: 46.9 % (ref 39.0–52.0)
Hemoglobin: 15.8 g/dL (ref 13.0–17.0)
MCH: 29.6 pg (ref 26.0–34.0)
MCHC: 33.7 g/dL (ref 30.0–36.0)
MCV: 87.8 fL (ref 78.0–100.0)
Platelets: 241 10*3/uL (ref 150–400)
RBC: 5.34 MIL/uL (ref 4.22–5.81)
RDW: 12 % (ref 11.5–15.5)
WBC: 5.4 10*3/uL (ref 4.0–10.5)

## 2018-03-24 LAB — RAPID URINE DRUG SCREEN, HOSP PERFORMED
Amphetamines: NOT DETECTED
Barbiturates: NOT DETECTED
Benzodiazepines: NOT DETECTED
Cocaine: NOT DETECTED
Opiates: NOT DETECTED
Tetrahydrocannabinol: NOT DETECTED

## 2018-03-24 LAB — ACETAMINOPHEN LEVEL

## 2018-03-24 LAB — SALICYLATE LEVEL

## 2018-03-24 LAB — ETHANOL: Alcohol, Ethyl (B): <10 mg/dL (ref <10)

## 2018-03-24 MED ORDER — LORAZEPAM 1 MG PO TABS
1.0000 mg | ORAL_TABLET | Freq: Once | ORAL | Status: AC
  Administered 2018-03-24: 1 mg via ORAL
  Filled 2018-03-24: qty 1

## 2018-03-24 MED ORDER — LORAZEPAM 1 MG PO TABS: 0.0000 mg | ORAL_TABLET | Freq: Two times a day (BID) | ORAL | Status: DC

## 2018-03-24 MED ORDER — LORAZEPAM 1 MG PO TABS
0.0000 mg | ORAL_TABLET | Freq: Four times a day (QID) | ORAL | Status: DC
  Administered 2018-03-24: 1 mg via ORAL
  Filled 2018-03-24: qty 1

## 2018-03-24 MED ORDER — VITAMIN B-1 100 MG PO TABS
100.0000 mg | ORAL_TABLET | Freq: Every day | ORAL | Status: DC
  Administered 2018-03-24: 100 mg via ORAL
  Filled 2018-03-24: qty 1

## 2018-03-24 MED ORDER — LORAZEPAM 2 MG/ML IJ SOLN: 0.0000 mg | Freq: Four times a day (QID) | INTRAMUSCULAR | Status: DC

## 2018-03-24 MED ORDER — THIAMINE HCL 100 MG/ML IJ SOLN: 100.0000 mg | Freq: Every day | INTRAMUSCULAR | Status: DC

## 2018-03-24 MED ORDER — ALUM & MAG HYDROXIDE-SIMETH 200-200-20 MG/5ML PO SUSP: 30.0000 mL | Freq: Four times a day (QID) | ORAL | Status: DC | PRN

## 2018-03-24 MED ORDER — LORAZEPAM 2 MG/ML IJ SOLN: 0.0000 mg | Freq: Two times a day (BID) | INTRAMUSCULAR | Status: DC

## 2018-03-24 NOTE — ED Notes (Signed)
Valuables given to security valuables number # [phone]

## 2018-03-24 NOTE — ED Notes (Signed)
Per Vernell LeepJean SW at [facility] pt psych cleared and recommendation for discharge

## 2018-03-24 NOTE — ED Notes (Signed)
Pt states, "I took a Suboxone yesterday and need a prescription for them." pt calm & cooperative at this time

## 2018-03-24 NOTE — ED Provider Notes (Signed)
[facility] Provider [facility]   CSN: [id number] Arrival date & time: 03/24/18  0340     History   Chief Complaint Chief Complaint  Patient presents with  . Suicidal    HPI Marc Hart is a 27 y.o. male with a hx of schizophrenia, substance abuse` presents to the Emergency Department with psychosis.  Level 5 caveat for psychiatric condition.  Patient has difficulty answering questions or following conversation.  He rambles stating "I have onions coming out of my armpits.  There are worms coming out of my ears."  When asked his name he replies "Marc Hart."  Patient dates he does not know who the president is, what day it is, what month it is or where he is.  Does deny a plan of suicide or homicide.  He denies auditory or visual hallucinations.  Record review shows the patient is taking Suboxone for his substance abuse.  When questioned about this he reports that he intermittently drinks alcohol.  He does report injecting heroin but cannot remember the last time he injected.  The history is provided by the patient and medical records. No language interpreter was used.    Past Medical History:  Diagnosis Date  . Schizophrenia (HCC)   . Substance abuse [facility](HCC)     Patient Active Problem List   Diagnosis Date Noted  . Depression   . Polysubstance abuse (HCC)   . Substance induced mood disorder (HCC) 09/25/2015  . Alcohol dependence with uncomplicated withdrawal (HCC) 09/16/2015  . 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 Depressive disorder 09/12/2015  . History of schizophrenia   . Schizoaffective disorder (HCC) 12/18/2014    Past Surgical History:  Procedure Laterality Date  . OTHER SURGICAL HISTORY     Ear Surgery         Home Medications    Prior to Admission medications   Medication Sig Start Date End Date Taking? Authorizing Provider  B Complex-C (B-COMPLEX WITH VITAMIN C) tablet Take 1 tablet by mouth daily.    Yes [provider]  buprenorphine-naloxone (SUBOXONE) 8-2 mg SUBL SL tablet Place 0.5 tablets under the tongue daily.   Yes [provider]  Pediatric Multiple Vit-C-FA (FLINSTONES GUMMIES OMEGA-3 DHA PO) Take 1 tablet by mouth daily.   Yes [provider]  doxycycline (VIBRAMYCIN) 100 MG capsule Take 1 capsule (100 mg total) by mouth 2 (two) times daily. Patient not taking: Reported on 12/20/2017 10/01/17   Caccavale, Jeanette CapriceSophia, PA-C    Family History No family history on file.  Social History Social History   Tobacco Use  . Smoking status: Current Some Day Smoker  . Smokeless tobacco: Never Used  Substance Use Topics  . Alcohol use: Yes  . Drug use: Yes    Types: Marijuana, IV, Cocaine    Comment: heroin     Allergies   Amoxicillin; Ibuprofen; Latex; Other; Peanut-containing drug products; Tylenol [acetaminophen]; Other; and Tylenol [acetaminophen]   Review of Systems Review of Systems  Unable to perform ROS: Psychiatric disorder     Physical Exam Updated Vital Signs BP 139/86 (BP Location: Right Arm)   Pulse (!) 117   Temp 99.2 F (37.3 C) (Oral)   Resp 18   SpO2 97%   Physical Exam  Constitutional: He appears well-developed and well-nourished. No distress.  HENT:  Head: Normocephalic.  Eyes: Conjunctivae are normal. No scleral icterus.  Neck: Normal range of motion.  Cardiovascular: Intact distal pulses. Tachycardia present.  Pulmonary/Chest: Effort  normal.  Musculoskeletal: Normal range of motion.  Neurological: He is alert.  Skin: Skin is warm and dry.  Psychiatric: He is actively hallucinating. He expresses no homicidal and no suicidal ideation. He expresses no suicidal plans and no homicidal plans.  Nursing note and vitals reviewed.    ED Treatments / Results  Labs (all labs ordered are listed, but only abnormal results are displayed) Labs Reviewed  COMPREHENSIVE METABOLIC PANEL - Abnormal; Notable for the following  components:      Result Value   Glucose, Bld 116 (*)    All other components within normal limits  ACETAMINOPHEN LEVEL - Abnormal; Notable for the following components:   Acetaminophen (Tylenol), Serum <10 (*)    All other components within normal limits  ETHANOL  SALICYLATE LEVEL  CBC  RAPID URINE DRUG SCREEN, HOSP PERFORMED    Procedures Procedures (including critical care time)  Medications Ordered in ED Medications  LORazepam (ATIVAN) injection 0-4 mg (has no administration in time range)    Or  LORazepam (ATIVAN) tablet 0-4 mg (has no administration in time range)  LORazepam (ATIVAN) injection 0-4 mg (has no administration in time range)    Or  LORazepam (ATIVAN) tablet 0-4 mg (has no administration in time range)  thiamine (VITAMIN B-1) tablet 100 mg (has no administration in time range)    Or  thiamine (B-1) injection 100 mg (has no administration in time range)  alum & mag hydroxide-simeth (MAALOX/MYLANTA) 200-200-20 MG/5ML suspension 30 mL (has no administration in time range)     Initial Impression / Assessment and Plan / ED Course  I have reviewed the triage vital signs and the nursing notes.  Pertinent labs & imaging results that were available during my care of the patient were reviewed by me and considered in my medical decision making (see chart for details).     With history of schizophrenia presents with psychosis today.  He denies auditory or visual hallucinations however his statements lead me to believe otherwise.  He also denies homicidal or suicidal ideations.  He is disheveled.  Labs are reassuring.  At this time there is no emergency medical condition which requires him.  He may be seen by TTS.  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

## 2018-03-24 NOTE — ED Notes (Signed)
Pt attempting again to obtain urine specimen.
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
## 2018-03-24 NOTE — ED Provider Notes (Signed)
Pt examined by TTS.  He is no longer suicidal.  Their recommendation is d/c.  Pt is stable for d/c.  He is given resources for f/u.   Jacalyn LefevreHaviland, Madaline Lefeber, MD 03/24/18 [phone]

## 2018-03-24 NOTE — ED Notes (Signed)
Pt in Pod F Room 7 for TTS completion, pt given Sprite and snack

## 2018-03-24 NOTE — ED Notes (Addendum)
Pt changed into burgundy paper scrubs, belongings bagged and placed in LOCKER # 1, Security called to wand pt, and Jillyn HiddenGary in staffing [facility].

## 2018-03-24 NOTE — ED Notes (Signed)
Pt attempted to obtain urine specimen again without success.

## 2018-03-24 NOTE — ED Triage Notes (Signed)
Pt states he is suicidal and homicidal- denies plan.  States he is also trying to get off heroin.

## 2018-03-24 NOTE — BHH Counselor (Signed)
This Probation officer met with pt again, this time including Elmarie Shiley, NP. Pt presents a little drowsy but cooperative. He reports he gets outpt psychiatric tx through the [facility] from [facility]. Pt denies current AVH, SI and HI.
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
## 2018-03-24 NOTE — ED Notes (Signed)
IVC rescinded by Particia NearingHaviland, MD. Pt belongings retrieved from locker and Security, given to pt, and signed for by pt. Pt departed in NAD, refused use of wheelchair.

## 2018-04-02 ENCOUNTER — Emergency Department (HOSPITAL_COMMUNITY)
Admission: EM | Admit: 2018-04-02 | Discharge: 2018-04-02 | Disposition: A | Discharge status: Home / Self Care | Payer: Self-pay | Attending: Emergency Medicine | Admitting: Emergency Medicine

## 2018-04-02 ENCOUNTER — Encounter (HOSPITAL_COMMUNITY): Payer: Self-pay | Admitting: Emergency Medicine

## 2018-04-02 DIAGNOSIS — H6690 Otitis media, unspecified, unspecified ear: Secondary | ICD-10-CM

## 2018-04-02 DIAGNOSIS — Z79899 Other long term (current) drug therapy: Secondary | ICD-10-CM | POA: Insufficient documentation

## 2018-04-02 DIAGNOSIS — H6692 Otitis media, unspecified, left ear: Secondary | ICD-10-CM | POA: Insufficient documentation

## 2018-04-02 DIAGNOSIS — Z91040 Latex allergy status: Secondary | ICD-10-CM | POA: Insufficient documentation

## 2018-04-02 DIAGNOSIS — F17210 Nicotine dependence, cigarettes, uncomplicated: Secondary | ICD-10-CM | POA: Insufficient documentation

## 2018-04-02 MED ORDER — CLINDAMYCIN HCL 150 MG PO CAPS
300.0000 mg | ORAL_CAPSULE | Freq: Once | ORAL | Status: AC
  Administered 2018-04-02: 300 mg via ORAL
  Filled 2018-04-02: qty 2

## 2018-04-02 MED ORDER — CLINDAMYCIN HCL 300 MG PO CAPS: 300.0000 mg | ORAL_CAPSULE | Freq: Four times a day (QID) | ORAL | 0 refills | Status: DC

## 2018-04-02 MED ORDER — IBUPROFEN 400 MG PO TABS
600.0000 mg | ORAL_TABLET | Freq: Once | ORAL | Status: AC
Start: 2018-04-02 — End: 2018-04-02
  Administered 2018-04-02: 600 mg via ORAL
  Filled 2018-04-02: qty 1

## 2018-04-02 NOTE — ED Triage Notes (Signed)
Patient reports intermittent  bilateral upper jaw pain for 2 weeks and left ear ache today , denies injury , no hearting loss or drainage .

## 2018-04-02 NOTE — Discharge Instructions (Addendum)
You have an ear infection on the left side this likely causing your discomfort.  Please take clindamycin as directed, make sure to take entire course of antibiotics even if your symptoms resolve.  You may use Tylenol and Motrin to help with your generalized symptoms, Biofreeze for muscle aches as needed.  Make sure you are drinking plenty of fluids.  Please follow-up with your primary care doctor.  Return to the emergency department for fevers, worsening ear pain, redness or swelling behind the ear, severe headaches with neck stiffness or any other new or concerning symptoms.

## 2018-04-02 NOTE — ED Provider Notes (Signed)
[facility] EMERGENCY DEPARTMENT Provider Note   CSN: [id number] Arrival date & time: 04/02/18  2013     History   Chief Complaint Chief Complaint  Patient presents with  . Jaw Pain  . Otalgia    HPI Marc Hart is a 27 y.o. male.  Marc Hart is a 27 y.o. Male with a history of schizophrenia and polysubstance abuse, who presents to the emergency department for evaluation of left ear pain for the past few days as well as some upper jaw pain.  This pain has been constant and worsening in nature.  He denies drainage from the ear or hearing loss.  No recent head injury.  He also reports just feeling achy all over.  Patient denies fevers or chills.  Patient denies chest pain or shortness of breath.  No cough, or rhinorrhea.  No abdominal pain, nausea or vomiting.  No arthralgias or back pain.  No urinary symptoms.  Patient has taken nothing for his symptoms prior to arrival.  He also reports the symptoms may be related to the fact that he recently stopped using meth, heroin and other recreational drugs.     Past Medical History:  Diagnosis Date  . Schizophrenia (HCC)   . Substance abuse [facility])     Patient Active Problem List   Diagnosis Date Noted  . Depression   . Polysubstance abuse (HCC)   . Substance induced mood disorder (HCC) 09/25/2015  . Alcohol dependence with uncomplicated withdrawal (HCC) 09/16/2015  . 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 Depressive disorder 09/12/2015  . History of schizophrenia   . Schizoaffective disorder (HCC) 12/18/2014    Past Surgical History:  Procedure Laterality Date  . OTHER SURGICAL HISTORY     Ear Surgery         Home Medications    Prior to Admission medications   Medication Sig Start Date End Date Taking? Authorizing Provider  B Complex-C (B-COMPLEX WITH VITAMIN C) tablet Take 1 tablet by mouth daily.    [provider]  buprenorphine-naloxone (SUBOXONE) 8-2 mg SUBL SL  tablet Place 0.5 tablets under the tongue daily.    [provider]  doxycycline (VIBRAMYCIN) 100 MG capsule Take 1 capsule (100 mg total) by mouth 2 (two) times daily. Patient not taking: Reported on 12/20/2017 10/01/17   Alveria Apley, PA-C  Pediatric Multiple Vit-C-FA (FLINSTONES GUMMIES OMEGA-3 DHA PO) Take 1 tablet by mouth daily.    [provider]    Family History No family history on file.  Social History Social History   Tobacco Use  . Smoking status: Current Some Day Smoker  . Smokeless tobacco: Never Used  Substance Use Topics  . Alcohol use: Yes  . Drug use: Yes    Types: Marijuana, IV, Cocaine, Methamphetamines    Comment: heroin     Allergies   Amoxicillin; Ibuprofen; Latex; Other; Peanut-containing drug products; Tylenol [acetaminophen]; Other; and Tylenol [acetaminophen]   Review of Systems Review of Systems  Constitutional: Negative for chills and fever.  HENT: Positive for ear pain. Negative for congestion, rhinorrhea, sinus pressure, sinus pain, sneezing and sore throat.   Eyes: Negative for visual disturbance.  Respiratory: Negative for cough and shortness of breath.   Cardiovascular: Negative for chest pain.  Gastrointestinal: Negative for abdominal pain, nausea and vomiting.  Genitourinary: Negative for dysuria and frequency.  Musculoskeletal: Negative for arthralgias and joint swelling.  Skin: Negative for color change and rash.  Neurological: Negative for dizziness, syncope, light-headedness  and headaches.     Physical Exam Updated Vital Signs BP 137/75 (BP Location: Right Arm)   Pulse 77   Temp 98.5 F (36.9 C) (Oral)   Resp 18   SpO2 100%   Physical Exam  Constitutional: He appears well-developed and well-nourished. No distress.  HENT:  Head: Normocephalic and atraumatic.  Left TM dull, with evidence of purulent effusion, auditory canal clear, no mastoid tenderness or erythema.  Left TM clear with good light  reflection.  Bilateral nares clear with mild amount of clear rhinorrhea.  Posterior oropharynx is clear, uvula midline.  Eyes: Right eye exhibits no discharge. Left eye exhibits no discharge.  Neck: Normal range of motion. Neck supple.  No rigidity  Cardiovascular: Normal rate, regular rhythm, normal heart sounds and intact distal pulses.  Pulmonary/Chest: Effort normal and breath sounds normal. No respiratory distress.  Respirations equal and unlabored, patient able to speak in full sentences, lungs clear to auscultation bilaterally  Abdominal: Soft. Bowel sounds are normal. He exhibits no distension and no mass. There is no tenderness. There is no guarding.  Abdomen soft, nondistended, nontender to palpation in all quadrants without guarding or peritoneal signs  Musculoskeletal:  T-spine and L-spine nontender to palpation at midline. Patient moves all extremities without difficulty. All joints supple and easily movable, no erythema, swelling or palpable deformity, all compartments soft.  Neurological: He is alert. Coordination normal.  Skin: Skin is warm and dry. No rash noted. He is not diaphoretic.  Psychiatric: He has a normal mood and affect. His behavior is normal.  Nursing note and vitals reviewed.    ED Treatments / Results  Labs (all labs ordered are listed, but only abnormal results are displayed) Labs Reviewed - No data to display  EKG None  Radiology No results found.  Procedures Procedures (including critical care time)  Medications Ordered in ED Medications  clindamycin (CLEOCIN) capsule 300 mg (has no administration in time range)  ibuprofen (ADVIL,MOTRIN) tablet 600 mg (has no administration in time range)     Initial Impression / Assessment and Plan / ED Course  I have reviewed the triage vital signs and the nursing notes.  Pertinent labs & imaging results that were available during my care of the patient were reviewed by me and considered in my medical  decision making (see chart for details).  Patient presents with otalgia and exam consistent with acute otitis media. No concern for acute mastoiditis, meningitis.  Patient has documented allergy to amoxicillin and is unsure if he is ever had cephalosporins, will treat with clindamycin.  Discussed appropriate symptomatic management.  Return precautions discussed.  Patient follow-up with primary care.  Patient expresses understanding and is in agreement with this plan.   At this time there does not appear to be any evidence of an acute emergency medical condition and the patient appears stable for discharge with appropriate outpatient follow up   Final Clinical Impressions(s) / ED Diagnoses   Final diagnoses:  Acute otitis media, unspecified otitis media type    ED Discharge Orders        Ordered    clindamycin (CLEOCIN) 300 MG capsule  4 times daily     04/02/18 2231       Legrand RamsFord, Thatcher Doberstein N, PA-C 04/02/18 2237    Marily MemosMesner, Jason, MD 04/04/18 [phone]

## 2018-04-20 ENCOUNTER — Emergency Department (HOSPITAL_COMMUNITY): Admission: EM | Admit: 2018-04-20 | Discharge: 2018-04-20 | Discharge status: Against Medical Advice | Payer: Self-pay

## 2018-06-24 ENCOUNTER — Emergency Department (HOSPITAL_COMMUNITY)
Admission: EM | Admit: 2018-06-24 | Discharge: 2018-06-26 | Disposition: A | Discharge status: Other Acute Inpatient Hospital | Payer: Self-pay | Attending: Emergency Medicine | Admitting: Emergency Medicine

## 2018-06-24 ENCOUNTER — Encounter (HOSPITAL_COMMUNITY): Payer: Self-pay

## 2018-06-24 DIAGNOSIS — R4182 Altered mental status, unspecified: Secondary | ICD-10-CM | POA: Insufficient documentation

## 2018-06-24 DIAGNOSIS — F10920 Alcohol use, unspecified with intoxication, uncomplicated: Secondary | ICD-10-CM

## 2018-06-24 DIAGNOSIS — Z79899 Other long term (current) drug therapy: Secondary | ICD-10-CM | POA: Insufficient documentation

## 2018-06-24 DIAGNOSIS — Z91040 Latex allergy status: Secondary | ICD-10-CM | POA: Insufficient documentation

## 2018-06-24 DIAGNOSIS — F17200 Nicotine dependence, unspecified, uncomplicated: Secondary | ICD-10-CM | POA: Insufficient documentation

## 2018-06-24 DIAGNOSIS — L03032 Cellulitis of left toe: Secondary | ICD-10-CM | POA: Insufficient documentation

## 2018-06-24 DIAGNOSIS — F10220 Alcohol dependence with intoxication, uncomplicated: Secondary | ICD-10-CM | POA: Insufficient documentation

## 2018-06-25 ENCOUNTER — Encounter (HOSPITAL_COMMUNITY): Payer: Self-pay

## 2018-06-25 ENCOUNTER — Emergency Department (HOSPITAL_COMMUNITY): Payer: Self-pay

## 2018-06-25 ENCOUNTER — Other Ambulatory Visit: Payer: Self-pay | Admitting: Registered Nurse

## 2018-06-25 LAB — CBC WITH DIFFERENTIAL/PLATELET
ABS IMMATURE GRANULOCYTES: 0.02 10*3/uL (ref 0.00–0.07)
BASOS ABS: 0.1 10*3/uL (ref 0.0–0.1)
Basophils Relative: 1 %
Eosinophils Absolute: 0.2 10*3/uL (ref 0.0–0.5)
Eosinophils Relative: 2 %
HEMATOCRIT: 46.7 % (ref 39.0–52.0)
HEMOGLOBIN: 15.3 g/dL (ref 13.0–17.0)
Immature Granulocytes: 0 %
LYMPHS PCT: 34 %
Lymphs Abs: 3.1 10*3/uL (ref 0.7–4.0)
MCH: 28.5 pg (ref 26.0–34.0)
MCHC: 32.8 g/dL (ref 30.0–36.0)
MCV: 87 fL (ref 80.0–100.0)
MONO ABS: 1 10*3/uL (ref 0.1–1.0)
Monocytes Relative: 10 %
NEUTROS ABS: 4.9 10*3/uL (ref 1.7–7.7)
NRBC: 0 % (ref 0.0–0.2)
Neutrophils Relative %: 53 %
Platelets: 260 10*3/uL (ref 150–400)
RBC: 5.37 MIL/uL (ref 4.22–5.81)
RDW: 13.1 % (ref 11.5–15.5)
WBC: 9.3 10*3/uL (ref 4.0–10.5)

## 2018-06-25 LAB — URINALYSIS, ROUTINE W REFLEX MICROSCOPIC
Bilirubin Urine: NEGATIVE
GLUCOSE, UA: NEGATIVE mg/dL
Hgb urine dipstick: NEGATIVE
Ketones, ur: NEGATIVE mg/dL
LEUKOCYTES UA: NEGATIVE
NITRITE: NEGATIVE
PH: 6 (ref 5.0–8.0)
Protein, ur: NEGATIVE mg/dL
Specific Gravity, Urine: 1.001 — ABNORMAL LOW (ref 1.005–1.030)

## 2018-06-25 LAB — AMMONIA
Ammonia: 37 umol/L — ABNORMAL HIGH (ref 9–35)
Ammonia: 31 umol/L (ref 9–35)

## 2018-06-25 LAB — COMPREHENSIVE METABOLIC PANEL
ALK PHOS: 93 U/L (ref 38–126)
ALT: 780 U/L — AB (ref 0–44)
AST: 435 U/L — AB (ref 15–41)
Albumin: 3.8 g/dL (ref 3.5–5.0)
Anion gap: 13 (ref 5–15)
BUN: 11 mg/dL (ref 6–20)
CALCIUM: 8.8 mg/dL — AB (ref 8.9–10.3)
CO2: 23 mmol/L (ref 22–32)
CREATININE: 0.85 mg/dL (ref 0.61–1.24)
Chloride: 107 mmol/L (ref 98–111)
GFR calc Af Amer: >60 mL/min (ref >60)
GFR calc non Af Amer: >60 mL/min (ref >60)
GLUCOSE: 96 mg/dL (ref 70–99)
Potassium: 3.7 mmol/L (ref 3.5–5.1)
SODIUM: 143 mmol/L (ref 135–145)
Total Bilirubin: 1.1 mg/dL (ref 0.3–1.2)
Total Protein: 6.4 g/dL — ABNORMAL LOW (ref 6.5–8.1)

## 2018-06-25 LAB — RAPID URINE DRUG SCREEN, HOSP PERFORMED
AMPHETAMINES: NOT DETECTED
Barbiturates: NOT DETECTED
Benzodiazepines: NOT DETECTED
Cocaine: NOT DETECTED
Opiates: NOT DETECTED
TETRAHYDROCANNABINOL: NOT DETECTED

## 2018-06-25 LAB — ETHANOL: Alcohol, Ethyl (B): 193 mg/dL — ABNORMAL HIGH (ref <10)

## 2018-06-25 LAB — I-STAT CG4 LACTIC ACID, ED: LACTIC ACID, VENOUS: 1.51 mmol/L (ref 0.5–1.9)

## 2018-06-25 LAB — SALICYLATE LEVEL: Salicylate Lvl: <7 mg/dL (ref 2.8–30.0)

## 2018-06-25 LAB — ACETAMINOPHEN LEVEL: Acetaminophen (Tylenol), Serum: <10 ug/mL — ABNORMAL LOW (ref 10–30)

## 2018-06-25 LAB — CK: CK TOTAL: 179 U/L (ref 49–397)

## 2018-06-25 MED ORDER — OLANZAPINE 5 MG PO TBDP
5.0000 mg | ORAL_TABLET | Freq: Every day | ORAL | Status: DC
  Administered 2018-06-25: 5 mg via ORAL
  Filled 2018-06-25 (×2): qty 1

## 2018-06-25 MED ORDER — DOXYCYCLINE HYCLATE 100 MG PO CAPS: 100.0000 mg | ORAL_CAPSULE | Freq: Two times a day (BID) | ORAL | 0 refills | Status: DC

## 2018-06-25 MED ORDER — DOXYCYCLINE HYCLATE 100 MG PO TABS
100.0000 mg | ORAL_TABLET | Freq: Once | ORAL | Status: AC
  Administered 2018-06-25: 100 mg via ORAL
  Filled 2018-06-25: qty 1

## 2018-06-25 MED ORDER — CLOTRIMAZOLE 1 % EX CREA: TOPICAL_CREAM | CUTANEOUS | 0 refills | Status: DC

## 2018-06-25 MED ORDER — CLOTRIMAZOLE 1 % EX CREA
TOPICAL_CREAM | Freq: Two times a day (BID) | CUTANEOUS | Status: DC
  Administered 2018-06-25: 13:00:00 via TOPICAL
  Filled 2018-06-25 (×2): qty 15

## 2018-06-25 MED ORDER — DOXYCYCLINE HYCLATE 100 MG PO TABS
100.0000 mg | ORAL_TABLET | Freq: Two times a day (BID) | ORAL | Status: DC
  Administered 2018-06-25 (×2): 100 mg via ORAL
  Filled 2018-06-25 (×2): qty 1
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
## 2018-06-25 NOTE — ED Notes (Signed)
Patient transported to CT 

## 2018-06-25 NOTE — ED Notes (Addendum)
Pt recommended by behavioral NP to to admitted to IP services. Pt wanded by security, changed into maroon scrubs, and personal belongings inventoried in [place] F locker #2 (clothing and shoes, no valuables or medications on person). Pt unable to sign forms as he is currently in psychotic episode (speaking different languages, saying numbers, and uncontrollably laughing). Sitter ordered, pt being monitored continuously by ED staff at this time.

## 2018-06-25 NOTE — ED Notes (Signed)
Pt ambulated to restroom with steady gait.

## 2018-06-25 NOTE — ED Provider Notes (Signed)
Case discussed with TTS, who plan on admitting the patient.  They are going to start Zyprexa Zydis.  Will initiate holding orders, and transfer to the psychiatric holding area.   Mancel Bale, MD 06/25/18 1120
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
## 2018-06-25 NOTE — ED Notes (Signed)
Pt awake, eating breakfast. Denies HI/SI.
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
## 2018-06-25 NOTE — Progress Notes (Signed)
Pt. meets criteria for inpatient treatment per [facility] NP.  Referred out to the following hospitals: [facility]  [facility]  [facility]  [facility]  [facility]  [facility]  [facility]  [facility]  [facility]     Disposition CSW will continue to follow for placement.  Timmothy Euler. Kaylyn Lim, MSW, LCSWA Disposition Clinical Social Work [phone] (cell) [phone] (office)

## 2018-06-25 NOTE — Discharge Instructions (Addendum)
Follow up with a primary doctor to recheck your liver function. Reduce your alcohol intake and stop using drugs. Take the antibiotics as prescribed and follow-up with Dr. Logan Bores the podiatrist.  Also use the cream for possible athlete's foot.  Return to the ED with new or worsening symptoms.

## 2018-06-26 ENCOUNTER — Inpatient Hospital Stay (HOSPITAL_COMMUNITY)
Admission: AD | Admit: 2018-06-26 | Discharge: 2018-07-02 | DRG: 885 | Disposition: A | Discharge status: Home / Self Care | Payer: Federal, State, Local not specified - Other | Attending: Psychiatry | Admitting: Psychiatry

## 2018-06-26 ENCOUNTER — Encounter (HOSPITAL_COMMUNITY): Payer: Self-pay

## 2018-06-26 ENCOUNTER — Other Ambulatory Visit: Payer: Self-pay

## 2018-06-26 DIAGNOSIS — F1910 Other psychoactive substance abuse, uncomplicated: Secondary | ICD-10-CM | POA: Diagnosis present

## 2018-06-26 DIAGNOSIS — Z590 Homelessness: Secondary | ICD-10-CM

## 2018-06-26 DIAGNOSIS — L03039 Cellulitis of unspecified toe: Secondary | ICD-10-CM | POA: Diagnosis present

## 2018-06-26 DIAGNOSIS — R451 Restlessness and agitation: Secondary | ICD-10-CM | POA: Diagnosis not present

## 2018-06-26 DIAGNOSIS — F329 Major depressive disorder, single episode, unspecified: Secondary | ICD-10-CM | POA: Diagnosis present

## 2018-06-26 DIAGNOSIS — F200 Paranoid schizophrenia: Secondary | ICD-10-CM | POA: Diagnosis not present

## 2018-06-26 DIAGNOSIS — B1920 Unspecified viral hepatitis C without hepatic coma: Secondary | ICD-10-CM | POA: Diagnosis present

## 2018-06-26 DIAGNOSIS — G4700 Insomnia, unspecified: Secondary | ICD-10-CM | POA: Diagnosis present

## 2018-06-26 DIAGNOSIS — L03119 Cellulitis of unspecified part of limb: Secondary | ICD-10-CM | POA: Diagnosis not present

## 2018-06-26 DIAGNOSIS — B353 Tinea pedis: Secondary | ICD-10-CM | POA: Diagnosis present

## 2018-06-26 DIAGNOSIS — F10239 Alcohol dependence with withdrawal, unspecified: Secondary | ICD-10-CM | POA: Diagnosis present

## 2018-06-26 DIAGNOSIS — F209 Schizophrenia, unspecified: Principal | ICD-10-CM

## 2018-06-26 DIAGNOSIS — F419 Anxiety disorder, unspecified: Secondary | ICD-10-CM

## 2018-06-26 DIAGNOSIS — F17210 Nicotine dependence, cigarettes, uncomplicated: Secondary | ICD-10-CM | POA: Diagnosis present

## 2018-06-26 LAB — HEPATITIS PANEL, ACUTE
HCV Ab: >11 s/co ratio — ABNORMAL HIGH (ref 0.0–0.9)
HEP A IGM: NEGATIVE
Hep B C IgM: NEGATIVE
Hepatitis B Surface Ag: NEGATIVE

## 2018-06-26 MED ORDER — LORAZEPAM 1 MG PO TABS
1.0000 mg | ORAL_TABLET | Freq: Three times a day (TID) | ORAL | Status: AC
  Administered 2018-06-27 – 2018-06-28 (×3): 1 mg via ORAL
  Filled 2018-06-26 (×3): qty 1

## 2018-06-26 MED ORDER — ZIPRASIDONE MESYLATE 20 MG IM SOLR: 20.0000 mg | Freq: Two times a day (BID) | INTRAMUSCULAR | Status: DC | PRN

## 2018-06-26 MED ORDER — ONDANSETRON 4 MG PO TBDP: 4.0000 mg | ORAL_TABLET | Freq: Four times a day (QID) | ORAL | Status: AC | PRN

## 2018-06-26 MED ORDER — VITAMIN B-1 100 MG PO TABS
100.0000 mg | ORAL_TABLET | Freq: Every day | ORAL | Status: DC
  Administered 2018-06-27 – 2018-07-01 (×5): 100 mg via ORAL
  Filled 2018-06-26 (×9): qty 1

## 2018-06-26 MED ORDER — TRAZODONE HCL 50 MG PO TABS
50.0000 mg | ORAL_TABLET | Freq: Every evening | ORAL | Status: DC | PRN
  Administered 2018-06-26 – 2018-06-30 (×6): 50 mg via ORAL
  Filled 2018-06-26 (×5): qty 1

## 2018-06-26 MED ORDER — THIAMINE HCL 100 MG/ML IJ SOLN
100.0000 mg | Freq: Once | INTRAMUSCULAR | Status: AC
  Administered 2018-06-26: 100 mg via INTRAMUSCULAR
  Filled 2018-06-26: qty 2

## 2018-06-26 MED ORDER — PALIPERIDONE ER 6 MG PO TB24
6.0000 mg | ORAL_TABLET | Freq: Every day | ORAL | Status: DC
  Administered 2018-06-26 – 2018-07-02 (×7): 6 mg via ORAL
  Filled 2018-06-26 (×10): qty 1

## 2018-06-26 MED ORDER — DOXYCYCLINE HYCLATE 100 MG PO TABS
100.0000 mg | ORAL_TABLET | Freq: Two times a day (BID) | ORAL | Status: DC
  Administered 2018-06-26 – 2018-07-02 (×12): 100 mg via ORAL
  Filled 2018-06-26 (×3): qty 1
  Filled 2018-06-26: qty 9
  Filled 2018-06-26 (×3): qty 1
  Filled 2018-06-26: qty 9
  Filled 2018-06-26 (×9): qty 1

## 2018-06-26 MED ORDER — OLANZAPINE 5 MG PO TBDP
5.0000 mg | ORAL_TABLET | Freq: Three times a day (TID) | ORAL | Status: DC | PRN
  Administered 2018-06-27 – 2018-07-01 (×3): 5 mg via ORAL
  Filled 2018-06-26 (×3): qty 1

## 2018-06-26 MED ORDER — LOPERAMIDE HCL 2 MG PO CAPS: 2.0000 mg | ORAL_CAPSULE | ORAL | Status: AC | PRN

## 2018-06-26 MED ORDER — ADULT MULTIVITAMIN W/MINERALS CH
1.0000 | ORAL_TABLET | Freq: Every day | ORAL | Status: DC
  Administered 2018-06-26 – 2018-07-01 (×6): 1 via ORAL
  Filled 2018-06-26 (×10): qty 1

## 2018-06-26 MED ORDER — NICOTINE 21 MG/24HR TD PT24
21.0000 mg | MEDICATED_PATCH | Freq: Every day | TRANSDERMAL | Status: DC
  Filled 2018-06-26 (×9): qty 1

## 2018-06-26 MED ORDER — LORAZEPAM 1 MG PO TABS
1.0000 mg | ORAL_TABLET | Freq: Four times a day (QID) | ORAL | Status: AC
  Administered 2018-06-26 – 2018-06-27 (×4): 1 mg via ORAL
  Filled 2018-06-26 (×4): qty 1

## 2018-06-26 MED ORDER — HYDROXYZINE HCL 50 MG PO TABS
50.0000 mg | ORAL_TABLET | Freq: Four times a day (QID) | ORAL | Status: DC | PRN
  Administered 2018-06-26 – 2018-07-01 (×6): 50 mg via ORAL
  Filled 2018-06-26 (×4): qty 1
  Filled 2018-06-26: qty 2
  Filled 2018-06-26: qty 1

## 2018-06-26 MED ORDER — LORAZEPAM 1 MG PO TABS: 1.0000 mg | ORAL_TABLET | Freq: Four times a day (QID) | ORAL | Status: AC | PRN

## 2018-06-26 MED ORDER — HYDROXYZINE HCL 25 MG PO TABS: 25.0000 mg | ORAL_TABLET | Freq: Four times a day (QID) | ORAL | Status: DC | PRN

## 2018-06-26 MED ORDER — LORAZEPAM 1 MG PO TABS
1.0000 mg | ORAL_TABLET | Freq: Two times a day (BID) | ORAL | Status: AC
  Administered 2018-06-28 – 2018-06-29 (×2): 1 mg via ORAL
  Filled 2018-06-26 (×2): qty 1

## 2018-06-26 MED ORDER — CLOTRIMAZOLE 1 % EX CREA
TOPICAL_CREAM | Freq: Two times a day (BID) | CUTANEOUS | Status: DC
  Administered 2018-06-26 – 2018-07-01 (×9): via TOPICAL
  Filled 2018-06-26 (×2): qty 15

## 2018-06-26 MED ORDER — LORAZEPAM 1 MG PO TABS
1.0000 mg | ORAL_TABLET | Freq: Every day | ORAL | Status: AC
  Administered 2018-06-30: 1 mg via ORAL
  Filled 2018-06-26: qty 1
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
## 2018-06-26 NOTE — ED Notes (Addendum)
Pt ate breakfast- showered and changed into fresh scrubs. Report given to Lanora Manis, RN at [facility] at this time. [facility] called to transport. MD informed pt transferring to [facility].

## 2018-06-26 NOTE — Progress Notes (Signed)
Recreation Therapy Notes  Date: 10.24.19 Time: 1000 Location: 500 Hall Dayroom  Group Topic: Leisure Education  Goal Area(s) Addresses:  Patient will identify positive leisure activities.  Patient will identify one positive benefit of participation in leisure activities.   Intervention: Magazines, scissors, construction paper, glue sticks  Activity: Leisure Collage.  Patients were to create a leisure collage of activities they can do to relax or to engage with family/friends.  Education:  Leisure Education, Discharge Planning  Education Outcome: Acknowledges education/In group clarification offered/Needs additional education  Clinical Observations/Feedback: Pt did not attend group.    Ceriah Kohler, LRT/CTRS         Karlina Suares A 06/26/2018 12:13 PM 

## 2018-06-26 NOTE — Progress Notes (Addendum)
Pt is observed in the dayroom, seen interacting with peers. Pt attended wrap-up group.Pt appears flat/irritable/pained in affect and mood. Pt denies SI/HI/AVH/Pain at this time. Pt has bilat foot swelling. Ice pack given and Pt told to elevate. PRN vistaril and trazodone requested and given. No new c.o's. Support and encouragement provided. High falls risk protocol initiated and maintained due to unsteady gait. Will continue with POC.

## 2018-06-26 NOTE — ED Notes (Signed)
Pt resting at this time.

## 2018-06-26 NOTE — Progress Notes (Signed)
Recreation Therapy Notes  INPATIENT RECREATION THERAPY ASSESSMENT  Patient Details Name: Marc Hart MRN: [id number] DOB: 23-Jul-1991 Today's Date: 06/26/2018       Information Obtained From: Patient  Able to Participate in Assessment/Interview: Yes  Patient Presentation: Alert  Reason for Admission (Per Patient): Other (Comments)("I don't know why")  Patient Stressors: Other (Comment)(Pain problems, financial, homelessness)  Coping Skills:   Substance Abuse  Leisure Interests (2+):  (Pt stated he lost his ambition to do anything)  Frequency of Recreation/Participation:    Awareness of Community Resources:  No  Expressed Interest in [employer] Information: No  County of Residence:  [place]  Patient Main Form of Transportation: Walk  Patient Strengths:  "I don't know"  Patient Identified Areas of Improvement:  None  Patient Goal for Hospitalization:  "I don't know, get some sleep"  Current SI (including self-harm):  No  Current HI:  No  Current AVH: No  Staff Intervention Plan: Group Attendance, Collaborate with Interdisciplinary Treatment Team  Consent to Intern Participation: N/A    Caroll Rancher, LRT/CTRS  Lillia Abed, Jari Dipasquale A 06/26/2018, 1:06 PM
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
## 2018-06-26 NOTE — ED Notes (Signed)
RN called [facility]- asked to call back report at 0830.

## 2018-06-26 NOTE — [facility] Suicide Risk Assessment (Signed)
[facility] Admission Suicide Risk Assessment   Nursing information obtained from:  Patient, Review of record Demographic factors:  Male, Low socioeconomic status, Unemployed, Access to firearms, Caucasian Current Mental Status:  NA Loss Factors:  Financial problems / change in socioeconomic status, Legal issues Historical Factors:  Impulsivity Risk Reduction Factors:  NA  Total Time spent with patient: 1 hour Principal Problem: Schizophrenia (HCC) Diagnosis:   Patient Active Problem List   Diagnosis Date Noted  . Schizophrenia (HCC) [F20.9] 06/26/2018  . Depression [F32.9]   . Polysubstance abuse (HCC) [F19.10]   . Substance induced mood disorder (HCC) [F19.94] 09/25/2015  . Alcohol dependence with uncomplicated withdrawal (HCC) [F10.230] 09/16/2015  . Alcohol-induced mood disorder (HCC) [F10.94] 09/16/2015  . Depressive disorder [F32.9] 09/12/2015  . History of schizophrenia [Z86.59]   . Schizoaffective disorder (HCC) [F25.9] 12/18/2014   Subjective Data: see H&P  Continued Clinical Symptoms:    The "Alcohol Use Disorders Identification Test", Guidelines for Use in Primary Care, Second Edition.  World Science writer [facility]). Score between 0-7:  no or low risk or alcohol related problems. Score between 8-15:  moderate risk of alcohol related problems. Score between 16-19:  high risk of alcohol related problems. Score 20 or above:  warrants further diagnostic evaluation for alcohol dependence and treatment.  Psychiatric Specialty Exam: Physical Exam  Nursing note and vitals reviewed.     Blood pressure 137/70, pulse 86, temperature 98.3 F (36.8 C), temperature source Oral, resp. rate 18, height 5\' 6"  (1.676 m), weight 80 kg, SpO2 99 %.Body mass index is 28.47 kg/m.   COGNITIVE FEATURES THAT CONTRIBUTE TO RISK:  None    SUICIDE RISK:   Minimal: No identifiable suicidal ideation.  Patients presenting with no risk factors but with morbid ruminations; may be classified as  minimal risk based on the severity of the depressive symptoms  PLAN OF CARE: see H&P  I certify that inpatient services furnished can reasonably be expected to improve the patient's condition.   Micheal Likens, MD 06/26/2018, 5:17 PM

## 2018-06-26 NOTE — Progress Notes (Signed)
Adult Psychoeducational Group Note  Date:  06/26/2018 Time:  9:25 PM  Group Topic/Focus:  Wrap-Up Group:   The focus of this group is to help patients review their daily goal of treatment and discuss progress on daily workbooks.  Participation Level:  Active  Participation Quality:  Appropriate  Affect:  Appropriate  Cognitive:  Alert  Insight: Appropriate  Engagement in Group:  Engaged  Modes of Intervention:  Discussion  Additional Comments: Patient stated having a good day. Patient complained about the pain in his foot. Patient's goal for today was to talk with the Social Worker about getting into rehab following discharge.  Doninique Lwin L Hanadi Stanly 06/26/2018, 9:25 PM

## 2018-06-26 NOTE — [facility] Group Notes (Signed)
[facility] LCSW Group Therapy Note  Date/Time: 06/26/18, 1315  Type of Therapy/Topic:  Group Therapy:  Balance in Life  Participation Level:  moderate  Description of Group:    This group will address the concept of balance and how it feels and looks when one is unbalanced. Patients will be encouraged to process areas in their lives that are out of balance, and identify reasons for remaining unbalanced. Facilitators will guide patients utilizing problem- solving interventions to address and correct the stressor making their life unbalanced. Understanding and applying boundaries will be explored and addressed for obtaining  and maintaining a balanced life. Patients will be encouraged to explore ways to assertively make their unbalanced needs known to significant others in their lives, using other group members and facilitator for support and feedback.  Therapeutic Goals: 1. Patient will identify two or more emotions or situations they have that consume much of in their lives. 2. Patient will identify signs/triggers that life has become out of balance:  3. Patient will identify two ways to set boundaries in order to achieve balance in their lives:  4. Patient will demonstrate ability to communicate their needs through discussion and/or role plays  Summary of Patient Progress:Pt shared that financial, family, and friends are currently out of balance.  Pt made several comments during group discussion regarding identifying and trying to address things that get out of balance and was attentive throughout.           Therapeutic Modalities:   Cognitive Behavioral Therapy Solution-Focused Therapy Assertiveness Training  Daleen Squibb, [place]

## 2018-06-26 NOTE — ED Notes (Signed)
Consent for signed to go to [facility] and faxed to [facility] at this time with confirmation

## 2018-06-26 NOTE — Tx Team (Signed)
Initial Treatment Plan 06/26/2018 9:51 AM [name] UJW:[id number]    PATIENT STRESSORS: Financial difficulties Substance abuse   PATIENT STRENGTHS: Physical Health  General Fund of Knowledge    PATIENT IDENTIFIED PROBLEMS: Patient refused to answer  Substance Abuse                   DISCHARGE CRITERIA:  Ability to meet basic life and health needs Adequate post-discharge living arrangements Improved stabilization in mood, thinking, and/or behavior Medical problems require only outpatient monitoring Motivation to continue treatment in a less acute level of care Need for constant or close observation no longer present Reduction of life-threatening or endangering symptoms to within safe limits Safe-care adequate arrangements made Verbal commitment to aftercare and medication compliance Withdrawal symptoms are absent or subacute and managed without 24-hour nursing intervention  PRELIMINARY DISCHARGE PLAN: Outpatient therapy  PATIENT/FAMILY INVOLVEMENT: This treatment plan has been presented to and reviewed with the patient, Marc Hart.  The patient and family have been given the opportunity to ask questions and make suggestions.  Ferrel Logan, RN 06/26/2018, 9:51 AM

## 2018-06-27 NOTE — Progress Notes (Signed)
D: Pt denies SI/HI/AVH. Pt was anxious, agitated and somewhat somatic this evening. Pt appeared to have attention seeking behaviors. Pt stated to the MHT he wanted his own room, but did not express this information to this Clinical research associate. Pt stated he was having excruciating pain in his toe, but pt was visible on the unit in no distress. Pt continues to be focused on his foot. Pt was given an ice pack and told to elevate his foot.   A: Pt was offered support and encouragement. Pt was given scheduled medications. Pt was encourage to attend groups. Q 15 minute checks were done for safety.   R: Pt is taking medication. Pt receptive to treatment and safety maintained on unit.   Problem: Activity: Goal: Sleeping patterns will improve Outcome: Progressing   Problem: Coping: Goal: Ability to demonstrate self-control will improve Outcome: Progressing

## 2018-06-27 NOTE — Plan of Care (Signed)
I was called by nurse practitioner for hepatitis C antibody positive.  I did notice his transaminases are elevated.  Would recommend to follow-up CMP to make sure liver functions are normalizing.  Make sure patient follows up with the PCP and a GI doctor upon discharge.  Please put it in his AVS that he may follow-up with the [facility] or lab our GI.  There is no treatment required at this time for this hepatitis C antibody positive.

## 2018-06-27 NOTE — Progress Notes (Signed)
Adult Psychoeducational Group Note  Date:  06/27/2018 Time:  9:46 PM  Group Topic/Focus:  Wrap-Up Group:   The focus of this group is to help patients review their daily goal of treatment and discuss progress on daily workbooks.  Participation Level:  Active  Participation Quality:  Appropriate  Affect:  Appropriate  Cognitive:  Confused  Insight: Appropriate  Engagement in Group:  Engaged  Modes of Intervention:  Discussion  Additional Comments:  Patient attended group and said that his day was a 4.  Patient said he thinks he slept the entire day, but he wasn't sure   Yandell Mcjunkins W Sarabi Sockwell 06/27/2018, 9:46 PM

## 2018-06-27 NOTE — BHH Counselor (Signed)
Adult Comprehensive Assessment  Patient ID: Ogle Hoeffner Twist, male   DOB: 04-Jun-1991, 27 y.o.   MRN: [id number]  Information Source: Information source: Patient  Current Stressors:  Patient states their primary concerns and needs for treatment are:: Gwin stated he did not know why he was here, he thinks that he was brought in an ambulance Patient states their goals for this hospitilization and ongoing recovery are:: Fuad stated he is unsure about what his goals are while at the hospital Educational / Learning stressors: Veterinary surgeon / Lack of resources (include bankruptcy): He stated that he has been stressed about money Housing / Lack of housing: Currently unsheltered Murry reported Physical health (include injuries & life threatening diseases): 3 weeks ago he stated he twisted his foot and it has been hurting ever since  Living/Environment/Situation:  Living Arrangements: (Fermin reported that he is currently unsheltered) Living conditions (as described by patient or guardian): unsheltered  Family History:  Does patient have children?: (Does not want to answer "questions about my kids")  Childhood History:  By whom was/is the patient raised?: Father Description of patient's relationship with caregiver when they were a child: "Like me being his son" Patient's description of current relationship with people who raised him/her: "Good" How were you disciplined when you got in trouble as a child/adolescent?: "I don't know" Does patient have siblings?: Yes(I dont want to answer questions about my family)  Education:  Highest grade of school patient has completed: "I don't have much education.Marland KitchenMarland KitchenI don't know" Currently a student?: No Learning disability?: ("I don't know")  Employment/Work Situation:      Architect:   Financial resources: No income Does patient have a Lawyer or guardian?: No  Alcohol/Substance Abuse:   What has been your use of  drugs/alcohol within the last 12 months?: Tobacco use 2 cigarettes/day, sometimes weekly 1 or 2 joints/week. He stated he was done with the questions If attempted suicide, did drugs/alcohol play a role in this?: Yes Alcohol/Substance Abuse Treatment Hx: Denies past history Has alcohol/substance abuse ever caused legal problems?: No  Social Support System:   Patient's Community Support System: None Describe Community Support System: Stated he was finished answering questions.  Leisure/Recreation:      Strengths/Needs:      Discharge Plan:   Patient states they will know when they are safe and ready for discharge when: Markell is a 27 yo Caucasian males who presented voluntarily. He was irritable and unsure about why he was at the hospital. He is diagnosed with --. He stated that he did not want to answer questions about his family. Randon told me that he did not feel well, that he is detoxing and may want to oontinue to detox after leaving the hospital,. While here Kaegan may benefit from crises stabilization, medication management, a therapeutic milieu and referral for services.  Summary/Recommendations:  Nasif is 27 yo Caucasian Male who presented voluntarily. He has been diagnosed with  Schizophrenia. This Clinical research associate will contact his mother, Veatrice Kells to provide further information which Husain has agreed to     [employer]. 06/27/2018

## 2018-06-27 NOTE — BHH Counselor (Signed)
Adult Comprehensive Assessment  Patient ID: Marc Hart, male   DOB: 10-15-1990, 27 y.o.   MRN: [id number]  Information Source: Information source: Patient(Additional information provided by Marc Hart's mother)  Current Stressors:  Patient states their primary concerns and needs for treatment are:: Marc Hart stated that he was unsure why he was at the [facility] Patient states their goals for this hospitilization and ongoing recovery are:: Marc Hart stated he is unsure about what his goals are while at the hospital Educational / Learning stressors: Veterinary surgeon / Lack of resources (include bankruptcy): He stated that he has been stressed about money Housing / Lack of housing: Currently unsheltered Marc Hart reported Physical health (include injuries & life threatening diseases): 3 weeks ago he stated he twisted his foot and it has been hurting ever since  Living/Environment/Situation:  Living Arrangements: (Marc Hart reported that he is currently unsheltered) Living conditions (as described by patient or guardian): unsheltered  Family History:  Does patient have children?: Yes How many children?: 1 How is patient's relationship with their children?: He has an unstable relationship with his 5-yo son   Childhood History:  By whom was/is the patient raised?: Both parents Additional childhood history information: Parents divorced at age 60 Description of patient's relationship with caregiver when they were a child: He stated that he was mostly living with his dad Patient's description of current relationship with people who raised him/her: Currently he seems to get along with his mother but has very little contact with father, his mother stated. How were you disciplined when you got in trouble as a child/adolescent?: "I don't know" Does patient have siblings?: Yes(I dont want to answer questions about my family) Number of Siblings: 1 Description of patient's current relationship with siblings:  Mother stated that he misses the relationship that he had with his sister from their childhood. She added that the sister is now married and has children. Did patient suffer any verbal/emotional/physical/sexual abuse as a child?: (Unknown patient refused to answer) Did patient suffer from severe childhood neglect?: (Unknown) Has patient ever been sexually abused/assaulted/raped as an adolescent or adult?: (unknown) Was the patient ever a victim of a crime or a disaster?: (Unknown) Witnessed domestic violence?: (Unknown) Has patient been effected by domestic violence as an adult?: (Unknown)  Education:  Highest grade of school patient has completed: "I don't have much education.Marland KitchenMarland KitchenI don't know" Currently a student?: No Learning disability?: ("I don't know")  Employment/Work Situation:   Employment situation: Unemployed What is the longest time patient has a held a job?: Per his mother patient has never been able to consistently maintain employment Are There Guns or Other Weapons in Your Home?: No(Mother stated that there are no guns in her home)  Architect:   Financial resources: No income Does patient have a Lawyer or guardian?: No  Alcohol/Substance Abuse:   What has been your use of drugs/alcohol within the last 12 months?: Long-tern history of substance use disorder stated by his mother. He has been using meth, heroin, ecstasy. He has been revived by Narcan twice in the past 18 months. If attempted suicide, did drugs/alcohol play a role in this?: Yes Alcohol/Substance Abuse Treatment Hx: Past Tx, Inpatient If yes, describe treatment: Mother reported that he has been to [facility] as an adult twice; [name] in [place], [place]; at 27 yo he was inpatient at [facility]; he also was at [facility]; he had a youth program at [address], [place]; he went through [name] of the [place] as an adult  he was at [facility];  Has alcohol/substance abuse ever  caused legal problems?: Yes(History of mutiple drug charges)  Social Support System:   Patient's Community Support System: Fair(His mother has been a support to him and has custody of his 22 yo son) Describe Community Support System: Stated he was finished answering questions.  Leisure/Recreation:      Strengths/Needs:   Patient states these barriers may affect their return to the community: He is homeless and lacks any transportation Other important information patient would like considered in planning for their treatment: 3  Discharge Plan:   Currently receiving community mental health services: No Patient states concerns and preferences for aftercare planning are: [name] stated that he would like to be in a long-term recovery. His mother stated that she would help him into a long-term recovery program in [place] if he completes a 30day treatment program Patient states they will know when they are safe and ready for discharge when: Marc Hart is a 27 yo Caucasian males who presented voluntarily. He was irritable and unsure about why he was at the hospital. He is diagnosed with --. He stated that he did not want to answer questions about his family. Marc Hart told me that he did not feel well, that he is detoxing and may want to oontinue to detox after leaving the hospital,. While here Marc Hart may benefit from crises stabilization, medication management, a therapeutic milieu and referral for services. Does patient have access to transportation?: No Does patient have financial barriers related to discharge medications?: Yes Patient description of barriers related to discharge medications: He is uninsured  Summary/Recommendations:   Summary and Recommendations (to be completed by the evaluator): Marc Hart is a 27 yo Caucasian male who presented voluntarily.  He has been diagnosed with Schizophrenia. He was not cooperative with assessment questions but signed a consent form for permission to speak to his  mother. She provided most of the context in this assessment. [name] stated that he was interested in continuing inpatient substance use disorder treatment. His mother reported that if he completed a 30 day program she would help him with a long term recovery program in [place], [place]. His disposition in the community is unknown at this time. While here, Benen may benefit from crisis stabilization, medication management, a therapeutice millieu and referral for services.Marian Sorrow, MSW Intern. 06/27/2018
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
## 2018-06-27 NOTE — Tx Team (Signed)
Interdisciplinary Treatment and Diagnostic Plan Update  06/27/2018 Time of Session: 0944 [name] MRN: [id number]  Principal Diagnosis: Schizophrenia [facility])  Secondary Diagnoses: Principal Problem:   Schizophrenia ([place])   Current Medications:  Current Facility-Administered Medications  Medication Dose Route Frequency Provider Last Rate Last Dose  . clotrimazole (LOTRIMIN) 1 % cream   Topical BID Maris Berger T, MD      . doxycycline (VIBRA-TABS) tablet 100 mg  100 mg Oral Q12H Pennelope Bracken, MD   100 mg at 06/27/18 0830  . hydrOXYzine (ATARAX/VISTARIL) tablet 50 mg  50 mg Oral Q6H PRN Pennelope Bracken, MD   50 mg at 06/26/18 2101  . loperamide (IMODIUM) capsule 2-4 mg  2-4 mg Oral PRN Pennelope Bracken, MD      . LORazepam (ATIVAN) tablet 1 mg  1 mg Oral Q6H PRN Pennelope Bracken, MD      . LORazepam (ATIVAN) tablet 1 mg  1 mg Oral TID Pennelope Bracken, MD       Followed by  . [START ON 06/28/2018] LORazepam (ATIVAN) tablet 1 mg  1 mg Oral BID Pennelope Bracken, MD       Followed by  . [START ON 06/30/2018] LORazepam (ATIVAN) tablet 1 mg  1 mg Oral Daily [place], Christopher T, MD      . multivitamin with minerals tablet 1 tablet  1 tablet Oral Daily Pennelope Bracken, MD   1 tablet at 06/27/18 0830  . nicotine (NICODERM CQ - dosed in mg/24 hours) patch 21 mg  21 mg Transdermal Daily [place], Randa Ngo, MD      . OLANZapine zydis (ZYPREXA) disintegrating tablet 5 mg  5 mg Oral Q8H PRN Pennelope Bracken, MD       Or  . ziprasidone (GEODON) injection 20 mg  20 mg Intramuscular Q12H PRN Pennelope Bracken, MD      . ondansetron (ZOFRAN-ODT) disintegrating tablet 4 mg  4 mg Oral Q6H PRN Pennelope Bracken, MD      . paliperidone (INVEGA) 24 hr tablet 6 mg  6 mg Oral Daily Pennelope Bracken, MD   6 mg at 06/27/18 0830  . thiamine (VITAMIN B-1) tablet 100 mg  100 mg Oral Daily  Pennelope Bracken, MD   100 mg at 06/27/18 0830  . traZODone (DESYREL) tablet 50 mg  50 mg Oral QHS PRN,MR X 1 Pennelope Bracken, MD   50 mg at 06/26/18 2101   PTA Medications: Medications Prior to Admission  Medication Sig Dispense Refill Last Dose  . clindamycin (CLEOCIN) 300 MG capsule Take 1 capsule (300 mg total) by mouth 4 (four) times daily. X 7 days (Patient not taking: Reported on 06/25/2018) 28 capsule 0 Completed Course at Unknown time  . clotrimazole (LOTRIMIN) 1 % cream Apply to affected area 2 times daily 15 g 0   . doxycycline (VIBRAMYCIN) 100 MG capsule Take 1 capsule (100 mg total) by mouth 2 (two) times daily. 20 capsule 0     Patient Stressors: Financial difficulties Substance abuse  Patient Strengths: Physical Health  Treatment Modalities: Medication Management, Group therapy, Case management,  1 to 1 session with clinician, Psychoeducation, Recreational therapy.   Physician Treatment Plan for Primary Diagnosis: Schizophrenia [facility]) Long Term Goal(s): Improvement in symptoms so as ready for discharge Improvement in symptoms so as ready for discharge   Short Term Goals: Ability to identify and develop effective coping behaviors will improve Ability to maintain clinical measurements within  normal limits will improve  Medication Management: Evaluate patient's response, side effects, and tolerance of medication regimen.  Therapeutic Interventions: 1 to 1 sessions, Unit Group sessions and Medication administration.  Evaluation of Outcomes: Not Met  Physician Treatment Plan for Secondary Diagnosis: Principal Problem:   Schizophrenia ([place])  Long Term Goal(s): Improvement in symptoms so as ready for discharge Improvement in symptoms so as ready for discharge   Short Term Goals: Ability to identify and develop effective coping behaviors will improve Ability to maintain clinical measurements within normal limits will improve     Medication Management:  Evaluate patient's response, side effects, and tolerance of medication regimen.  Therapeutic Interventions: 1 to 1 sessions, Unit Group sessions and Medication administration.  Evaluation of Outcomes: Not Met   RN Treatment Plan for Primary Diagnosis: Schizophrenia ([place]) Long Term Goal(s): Knowledge of disease and therapeutic regimen to maintain health will improve  Short Term Goals: Ability to identify and develop effective coping behaviors will improve and Compliance with prescribed medications will improve  Medication Management: RN will administer medications as ordered by provider, will assess and evaluate patient's response and provide education to patient for prescribed medication. RN will report any adverse and/or side effects to prescribing provider.  Therapeutic Interventions: 1 on 1 counseling sessions, Psychoeducation, Medication administration, Evaluate responses to treatment, Monitor vital signs and CBGs as ordered, Perform/monitor CIWA, COWS, AIMS and Fall Risk screenings as ordered, Perform wound care treatments as ordered.  Evaluation of Outcomes: Not Met   LCSW Treatment Plan for Primary Diagnosis: Schizophrenia ([place]) Long Term Goal(s): Safe transition to appropriate next level of care at discharge, Engage patient in therapeutic group addressing interpersonal concerns.  Short Term Goals: Engage patient in aftercare planning with referrals and resources, Increase social support, Facilitate acceptance of mental health diagnosis and concerns and Increase skills for wellness and recovery  Therapeutic Interventions: Assess for all discharge needs, 1 to 1 time with Social worker, Explore available resources and support systems, Assess for adequacy in community support network, Educate family and significant other(s) on suicide prevention, Complete Psychosocial Assessment, Interpersonal group therapy.  Evaluation of Outcomes: Not Met   Progress in Treatment: Attending groups:  Yes. Participating in groups: Yes. Taking medication as prescribed: Yes. Toleration medication: Yes. Family/Significant other contact made: No, will contact:  mother Patient understands diagnosis: No. Discussing patient identified problems/goals with staff: Yes. Medical problems stabilized or resolved: Yes. Denies suicidal/homicidal ideation: Yes. Issues/concerns per patient self-inventory: No. Other: none  New problem(s) identified: No, Describe:  none  New Short Term/Long Term Goal(s):  Patient Goals:  "go to rehab"  Discharge Plan or Barriers:   Reason for Continuation of Hospitalization: Medication stabilization Other; describe altered mental status  Estimated Length of Stay: 3-5 days  Attendees: Patient: Marc Hart 06/27/2018   Physician: Dr. Nancy Fetter, MD 06/27/2018   Nursing: Sena Hitch, RN 06/27/2018   RN Care Manager: 06/27/2018   Social Worker: Lurline Idol, LCSW 06/27/2018   Recreational Therapist:  06/27/2018   Other:  06/27/2018   Other:  06/27/2018   Other: 06/27/2018        Scribe for Treatment Team: Joanne Chars, [place] 06/27/2018 11:21 AM

## 2018-06-27 NOTE — Progress Notes (Signed)
Recreation Therapy Notes  Date: 10.25.19 Time: 1000 Location: [address] Dayroom  Group Topic: Communication, Team Building, Problem Solving  Goal Area(s) Addresses:  Patient will effectively work with peer towards shared goal.  Patient will identify skill used to make activity successful.  Patient will identify how skills used during activity can be used to reach post d/c goals.   Intervention: STEM Activity   Activity: In team's, using 10 red plastic cups, patients were asked to build Hart pyramid.    Education: Social Skills, Discharge Planning.   Education Outcome: Acknowledges education/In group clarification offered/Needs additional education.   Clinical Observations/Feedback: Pt did not attend group.    Desree Leap, LRT/CTRS         Marc Hart 06/27/2018 11:30 AM 

## 2018-06-27 NOTE — Plan of Care (Signed)
  Problem: Activity: Goal: Interest or engagement in activities will improve Outcome: Progressing   Problem: Safety: Goal: Periods of time without injury will increase Outcome: Progressing  Dar Note: Patient presents with irritable affect and mood.  Denies suicidal thoughts, auditory and visual hallucinations.  Medications given as prescribed.  Routine safety checks maintained every 15 minutes.  Patient visible in milieu with minimal interactions.   Patient safe on and off the unit.

## 2018-06-28 DIAGNOSIS — L03119 Cellulitis of unspecified part of limb: Secondary | ICD-10-CM

## 2018-06-28 DIAGNOSIS — F10239 Alcohol dependence with withdrawal, unspecified: Secondary | ICD-10-CM

## 2018-06-28 DIAGNOSIS — B1920 Unspecified viral hepatitis C without hepatic coma: Secondary | ICD-10-CM

## 2018-06-28 DIAGNOSIS — F200 Paranoid schizophrenia: Secondary | ICD-10-CM

## 2018-06-28 MED ORDER — METHOCARBAMOL 500 MG PO TABS
500.0000 mg | ORAL_TABLET | Freq: Three times a day (TID) | ORAL | Status: DC | PRN
  Administered 2018-06-28 – 2018-07-01 (×2): 500 mg via ORAL
  Filled 2018-06-28 (×2): qty 1
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
## 2018-06-28 NOTE — [facility] Group Notes (Signed)
[facility] Group Notes: (Clinical Social Work)   06/28/2018      Type of Therapy:  Group Therapy   Participation Level:  Did Not Attend despite MHT prompting   Ambrose Mantle, LCSW 06/28/2018, 1:17 PM

## 2018-06-28 NOTE — Plan of Care (Signed)
  Problem: Activity: Goal: Interest or engagement in activities will improve Outcome: Not Progressing   Problem: Safety: Goal: Periods of time without injury will increase Outcome: Progressing   D: Pt alert and oriented on the unit. Pt denies SI/HI, A/VH. Pt did not attend or participate during group activities today. Pt was isolative in his room and slept most of the day. Pt rated his depression a 3 and anxiety a 5, both on a scale of 0 to 10. Pt is med compliant and cooperative. Pt stated that his goal for today is "to feel better, not be sick." A: Education, support and encouragement provided, q15 minute safety checks remain in effect. Medications administered per MD orders. R: No reactions/side effects to medicine noted. Pt denies any concerns at this time, and verbally contracts for safety. Pt ambulating on the unit with no issues. Pt remains safe on and off the unit.

## 2018-06-29 NOTE — [facility] Group Notes (Signed)
[facility] Group Notes:  (Nursing/MHT/Case Management/Adjunct)  Date:  06/29/2018  Time:  9-10:00 am  Type of Therapy:  Psychoeducational Skills and Goals Group  Participation Level:  Did Not Attend  Participation Quality:    Affect:    Cognitive:    Insight:    Engagement in Group:    Modes of Intervention:    Summary of Progress/Problems:  [name] 06/29/2018, 10:49 AM

## 2018-06-29 NOTE — Progress Notes (Signed)
D:  Marc Hart has been more visible on the unit this evening.  He attended evening wrap up group.  He stated his day was pretty good.  He was able to attend some groups.  He denied SI/HI or A/V hallucinations.  He took his hs medications without difficulty.  No prn's for pain given this evening but ice pack given to help when he was in bed.  He is currently resting with his eyes closed and appears to be asleep.   A:  1:1 with RN for support and encouragement.  Medications as ordered.  Q 15 minute checks maintained for safety.  Encouraged participation in group and unit activities.   R:  Marc Hart remains safe on the unit.  We will continue to monitor the progress towards his goals.

## 2018-06-29 NOTE — Plan of Care (Signed)
  Problem: Health Behavior/Discharge Planning: Goal: Compliance with treatment plan for underlying cause of condition will improve Outcome: Progressing Note:  Marc Hart has been taking his medications and started attending groups.
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
## 2018-06-29 NOTE — [facility] Group Notes (Signed)
[facility] Group Notes:  (Nursing)  Date:  06/29/2018  Time: 400 PM Type of Therapy:  Nurse Education  Participation Level:  Active  Participation Quality:  Appropriate and Attentive  Affect:  Appropriate  Cognitive:  Appropriate  Insight:  Improving  Engagement in Group:  Engaged  Modes of Intervention:  Discussion and Education  Summary of Progress/Problems: Nurse led Relaxation Group Shela Nevin 06/29/2018, 6:33 PM

## 2018-06-29 NOTE — [facility] Group Notes (Signed)
[facility] LCSW Group Therapy Note  Date/Time:  06/29/2018  11:00AM-12:00PM  Type of Therapy and Topic:  Group Therapy:  Music and Mood  Participation Level:  Did Not Attend   Description of Group: In this process group, members listened to a variety of genres of music and identified that different types of music evoke different responses.  Patients were encouraged to identify music that was soothing for them and music that was energizing for them.  Patients discussed how this knowledge can help with wellness and recovery in various ways including managing depression and anxiety as well as encouraging healthy sleep habits.    Therapeutic Goals: 1. Patients will explore the impact of different varieties of music on mood 2. Patients will verbalize the thoughts they have when listening to different types of music 3. Patients will identify music that is soothing to them as well as music that is energizing to them 4. Patients will discuss how to use this knowledge to assist in maintaining wellness and recovery 5. Patients will explore the use of music as a coping skill  Summary of Patient Progress:  N/A  Therapeutic Modalities: Solution Focused Brief Therapy Activity   Ambrose Mantle, LCSW

## 2018-06-29 NOTE — Progress Notes (Signed)
D. Pt presents with a flat affect, depressed mood- isolative to room for much of the morning- has been calm and cooperative with no behavior issues. Per pt's self inventory , pt rates his depression, hopelessness and anxiety a 10/10/5, respectively.  Pt currently denies SI/HI and AV hallucinations. A. Labs and vitals monitored. Pt compliant with medications. Pt supported emotionally and encouraged to express concerns and ask questions.   R. Pt remains safe with 15 minute checks. Will continue POC.

## 2018-06-30 DIAGNOSIS — F1910 Other psychoactive substance abuse, uncomplicated: Secondary | ICD-10-CM

## 2018-06-30 DIAGNOSIS — R451 Restlessness and agitation: Secondary | ICD-10-CM

## 2018-06-30 LAB — CULTURE, BLOOD (ROUTINE X 2)
CULTURE: NO GROWTH
CULTURE: NO GROWTH

## 2018-06-30 MED ORDER — PALIPERIDONE PALMITATE ER 234 MG/1.5ML IM SUSY
234.0000 mg | PREFILLED_SYRINGE | Freq: Once | INTRAMUSCULAR | Status: AC
  Administered 2018-06-30: 234 mg via INTRAMUSCULAR
  Filled 2018-06-30: qty 1.5

## 2018-06-30 MED ORDER — PALIPERIDONE PALMITATE ER 156 MG/ML IM SUSY
156.0000 mg | PREFILLED_SYRINGE | INTRAMUSCULAR | Status: DC
  Administered 2018-07-02: 156 mg via INTRAMUSCULAR
  Filled 2018-06-30: qty 1

## 2018-06-30 NOTE — [facility] Group Notes (Signed)
[facility] LCSW Group Therapy Note  Date/Time:06/30/18, 1315  Type of Therapy and Topic:  Group Therapy:  Overcoming Obstacles  Participation Level:  moderate  Description of Group:    In this group patients will be encouraged to explore what they see as obstacles to their own wellness and recovery. They will be guided to discuss their thoughts, feelings, and behaviors related to these obstacles. The group will process together ways to cope with barriers, with attention given to specific choices patients can make. Each patient will be challenged to identify changes they are motivated to make in order to overcome their obstacles. This group will be process-oriented, with patients participating in exploration of their own experiences as well as giving and receiving support and challenge from other group members.  Therapeutic Goals: 1. Patient will identify personal and current obstacles as they relate to admission. 2. Patient will identify barriers that currently interfere with their wellness or overcoming obstacles.  3. Patient will identify feelings, thought process and behaviors related to these barriers. 4. Patient will identify two changes they are willing to make to overcome these obstacles:    Summary of Patient Progress: Pt had kleenex tissues in his nose and when CSW asked him he said he had a bloody nose.  Pt identified being homeless as his current obstacle.  Pt gave silly answers to several questions and was not paying much attention to the group discussion taking place.       Therapeutic Modalities:   Cognitive Behavioral Therapy Solution Focused Therapy Motivational Interviewing Relapse Prevention Therapy  Daleen Squibb, LCSW

## 2018-06-30 NOTE — Plan of Care (Signed)
  Problem: Activity: Goal: Interest or engagement in activities will improve Outcome: Progressing   Problem: Coping: Goal: Ability to verbalize frustrations and anger appropriately will improve Outcome: Progressing Goal: Ability to demonstrate self-control will improve Outcome: Progressing   D: Pt alert and oriented on the unit. Pt engaging with RN staff and other pts. Pt denies SI/HI, A/VH. Pt rated his depression a 3, hopelessness a 0, and anxiety a 0, all on a 0 to 10 scale. Pt is cooperative and medication complaint. A: Education, support and encouragement provided, q15 minute safety checks remain in effect. Medications administered per MD orders. R: No reactions/side effects to medicine noted. Pt verbally contracts for safety. Pt ambulatory, and remains safe on and off the unit.

## 2018-06-30 NOTE — Progress Notes (Addendum)
D: Patient is alert and cooperative. Patient presents with flat facial expression and circumstantial/delusional thought process. Patient denies SI, HI, AVH, and verbally contracts for safety. Patient reports "i've been cooking drugs in the woods since I was 4" and "I live in the woods". Patient reports his stomach is feeling better than when he first got here and he is eating better. Patient reports sore area on left great toe. Patient requests PRN medication for anxiety and sleep.   A: Scheduled medications administered per MD order. PRN sleep and anxiety medication administered per MD orders. Left great toe assessed and taped for comfort. Support provided. Patient educated on safety on the unit and medications. Routine safety checks every 15 minutes. Patient stated understanding to tell nurse about any new physical symptoms. Patient understands to tell staff of any needs.     R: No adverse drug reactions noted. Patient verbally contracts for safety. Patient remains safe at this time and will continue to monitor.

## 2018-06-30 NOTE — Plan of Care (Signed)
  Problem: Education: Goal: Knowledge of [facility] Education information/materials will improve Outcome: Progressing   Problem: Safety: Goal: Periods of time without injury will increase Outcome: Progressing   Patient oriented to the unit. Patient remains safe and will continue to monitor.  

## 2018-06-30 NOTE — Progress Notes (Signed)
Recreation Therapy Notes  Date: 10.28.19 Time: 1000 Location: 500 Hall Dayroom  Group Topic:  Goal Setting  Goal Area(s) Addresses:  Patient will be able to identify at least 3 life goals.   Patient will be able to identify benefit of investing in life goals.  Patient will be able to identify benefit of setting life goals.   Behavioral Response:  Engaged  Intervention: Worksheet, pencils  Activity: Life Goals.  Patients were to identify goals they wanted to accomplish in a wee, month, year and five years.  Patients were to then identify the obstacles they would face, what they needed in order to be successful and what they could start doing to work towards their goals.  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  Pt identified his obstacles as saving money and staying out of debt; pt expressed he needs to be sober and what he could do immediately was stay away from drugs and paraphernalia.     Caroll Rancher, LRT/CTRS      Lillia Abed, Ronen Bromwell A 06/30/2018 10:55 AM
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
## 2018-07-01 MED ORDER — HYDROXYZINE HCL 50 MG PO TABS
50.0000 mg | ORAL_TABLET | Freq: Every day | ORAL | Status: DC
  Administered 2018-07-01: 50 mg via ORAL
  Filled 2018-07-01 (×2): qty 1

## 2018-07-01 NOTE — [facility] Suicide Risk Assessment (Signed)
[facility] INPATIENT:  Family/Significant Other Suicide Prevention Education  Suicide Prevention Education:  Education Completed; Emmit Alexanders, mother, [phone], has been identified by the patient as the family member/significant other with whom the patient will be residing, and identified as the person(s) who will aid the patient in the event of a mental health crisis (suicidal ideations/suicide attempt).  With written consent from the patient, the family member/significant other has been provided the following suicide prevention education, prior to the and/or following the discharge of the patient.  The suicide prevention education provided includes the following:  Suicide risk factors  Suicide prevention and interventions  National Suicide Hotline telephone number  [facility] assessment telephone number  [facility] Emergency Assistance 911  [facility] and/or Residential Mobile Crisis Unit telephone number  Request made of family/significant other to:  Remove weapons (e.g., guns, rifles, knives), all items previously/currently identified as safety concern.  No guns currently per mother.   Remove drugs/medications (over-the-counter, prescriptions, illicit drugs), all items previously/currently identified as a safety concern.  The family member/significant other verbalizes understanding of the suicide prevention education information provided.  The family member/significant other agrees to remove the items of safety concern listed above.    Lorri Frederick, LCSW 07/01/2018, 1:11 PM

## 2018-07-01 NOTE — [facility] Group Notes (Signed)
[facility] LCSW Group Therapy Note  Date/Time: 07/01/18, 1100  Type of Therapy/Topic:  Group Therapy:  Feelings about Diagnosis  Participation Level:  Active   Mood: pleasant   Description of Group:    This group will allow patients to explore their thoughts and feelings about diagnoses they have received. Patients will be guided to explore their level of understanding and acceptance of these diagnoses. Facilitator will encourage patients to process their thoughts and feelings about the reactions of others to their diagnosis, and will guide patients in identifying ways to discuss their diagnosis with significant others in their lives. This group will be process-oriented, with patients participating in exploration of their own experiences as well as giving and receiving support and challenge from other group members.   Therapeutic Goals: 1. Patient will demonstrate understanding of diagnosis as evidence by identifying two or more symptoms of the disorder:  2. Patient will be able to express two feelings regarding the diagnosis 3. Patient will demonstrate ability to communicate their needs through discussion and/or role plays  Summary of Patient Progress: Pt continues to be very talkative during group but often off topic and talking about something unrelated to the group discussion.  Pt today is saying that the medication he is on is making him worse but was redirectable to the conclusion that he should discuss this with his MD and see what could be done.        Therapeutic Modalities:   Cognitive Behavioral Therapy Brief Therapy Feelings Identification   Daleen Squibb, LCSW

## 2018-07-01 NOTE — Progress Notes (Signed)
Recreation Therapy Notes  Date: 10.29.19 Time: 1000 Location: 500 Hall Dayroom  Group Topic: Leisure Education  Goal Area(s) Addresses:  Patient will identify positive leisure activities.  Patient will identify one positive benefit of participation in leisure activities.   Intervention: [employer], dry erase marker, eraser, various leisure activities  Activity: Pictionary. Patients were given the opportunity to pull a leisure activity from the container.  Patients were to then draw the activity on the board.  The remaining patients were to guess what the activity was.  The person that correctly guesses the activity gets the next turn.   Education:  Leisure Education, Building control surveyor  Education Outcome: Acknowledges education/In group clarification offered/Needs additional education  Clinical Observations/Feedback:  Pt did not attend group.    Caroll Rancher, LRT/CTRS         Caroll Rancher A 07/01/2018 12:36 PM

## 2018-07-01 NOTE — Progress Notes (Signed)
D: Pt denies SI/HI/AVH. Pt is pleasant and cooperative. Pt presents labile and needy at times, pt will try to manipulate staff at times. Pt visible on the unit this evening.   A: Pt was offered support and encouragement. Pt was given scheduled medications. Pt was encourage to attend groups. Q 15 minute checks were done for safety.   R:Pt attends groups and interacts well with peers and staff. Pt is taking medication. Pt has no complaints.Pt receptive to treatment and safety maintained on unit.   Problem: Physical Regulation: Goal: Ability to maintain clinical measurements within normal limits will improve Outcome: Progressing   Problem: Safety: Goal: Periods of time without injury will increase Outcome: Progressing   Problem: Physical Regulation: Goal: Complications related to the disease process, condition or treatment will be avoided or minimized Outcome: Progressing

## 2018-07-01 NOTE — Plan of Care (Signed)
  Problem: Education: Goal: Emotional status will improve Outcome: Not Progressing   Problem: Activity: Goal: Interest or engagement in activities will improve Outcome: Progressing   Problem: Safety: Goal: Periods of time without injury will increase Outcome: Progressing  DAR NOTE: Patient continues to presents with irritable affect and mood lability.  Speech is disorganized with delusional thought process. Denies suicidal thoughts, auditory and visual hallucinations.  Rates depression at 0, hopelessness at 4, and anxiety at 3.  Maintained on routine safety checks.  Medications given as prescribed.  Support and encouragement offered as needed.  Attended group and participated.  States goal for today is "quit hurting."  Patient observed socializing with peers in the dayroom.    Patient is safe on and off the unit.
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
## 2018-07-02 ENCOUNTER — Encounter (HOSPITAL_COMMUNITY): Payer: Self-pay | Admitting: Emergency Medicine

## 2018-07-02 ENCOUNTER — Emergency Department (HOSPITAL_COMMUNITY)
Admission: EM | Admit: 2018-07-02 | Discharge: 2018-07-03 | Disposition: A | Discharge status: Home / Self Care | Payer: Self-pay | Attending: Emergency Medicine | Admitting: Emergency Medicine

## 2018-07-02 ENCOUNTER — Other Ambulatory Visit: Payer: Self-pay

## 2018-07-02 DIAGNOSIS — F17210 Nicotine dependence, cigarettes, uncomplicated: Secondary | ICD-10-CM | POA: Insufficient documentation

## 2018-07-02 DIAGNOSIS — F209 Schizophrenia, unspecified: Secondary | ICD-10-CM | POA: Insufficient documentation

## 2018-07-02 DIAGNOSIS — F329 Major depressive disorder, single episode, unspecified: Secondary | ICD-10-CM | POA: Insufficient documentation

## 2018-07-02 DIAGNOSIS — F32A Depression, unspecified: Secondary | ICD-10-CM | POA: Diagnosis present

## 2018-07-02 DIAGNOSIS — Z79899 Other long term (current) drug therapy: Secondary | ICD-10-CM | POA: Insufficient documentation

## 2018-07-02 DIAGNOSIS — R45851 Suicidal ideations: Secondary | ICD-10-CM | POA: Insufficient documentation

## 2018-07-02 LAB — CBC
HEMATOCRIT: 43.3 % (ref 39.0–52.0)
Hemoglobin: 14.5 g/dL (ref 13.0–17.0)
MCH: 29.4 pg (ref 26.0–34.0)
MCHC: 33.5 g/dL (ref 30.0–36.0)
MCV: 87.8 fL (ref 80.0–100.0)
Platelets: 227 10*3/uL (ref 150–400)
RBC: 4.93 MIL/uL (ref 4.22–5.81)
RDW: 13.2 % (ref 11.5–15.5)
WBC: 7.3 10*3/uL (ref 4.0–10.5)
nRBC: 0 % (ref 0.0–0.2)

## 2018-07-02 LAB — COMPREHENSIVE METABOLIC PANEL
ALBUMIN: 3.7 g/dL (ref 3.5–5.0)
ALT: 276 U/L — AB (ref 0–44)
AST: 150 U/L — ABNORMAL HIGH (ref 15–41)
Alkaline Phosphatase: 68 U/L (ref 38–126)
Anion gap: 10 (ref 5–15)
BUN: 18 mg/dL (ref 6–20)
CO2: 25 mmol/L (ref 22–32)
CREATININE: 1.01 mg/dL (ref 0.61–1.24)
Calcium: 9.3 mg/dL (ref 8.9–10.3)
Chloride: 104 mmol/L (ref 98–111)
GFR calc non Af Amer: >60 mL/min (ref >60)
Glucose, Bld: 158 mg/dL — ABNORMAL HIGH (ref 70–99)
Potassium: 3.6 mmol/L (ref 3.5–5.1)
SODIUM: 139 mmol/L (ref 135–145)
Total Bilirubin: 0.7 mg/dL (ref 0.3–1.2)
Total Protein: 6.4 g/dL — ABNORMAL LOW (ref 6.5–8.1)

## 2018-07-02 LAB — ACETAMINOPHEN LEVEL: Acetaminophen (Tylenol), Serum: <10 ug/mL — ABNORMAL LOW (ref 10–30)

## 2018-07-02 LAB — RAPID URINE DRUG SCREEN, HOSP PERFORMED
AMPHETAMINES: NOT DETECTED
BARBITURATES: NOT DETECTED
BENZODIAZEPINES: NOT DETECTED
Cocaine: NOT DETECTED
Opiates: NOT DETECTED
TETRAHYDROCANNABINOL: NOT DETECTED

## 2018-07-02 LAB — ETHANOL: Alcohol, Ethyl (B): <10 mg/dL (ref <10)

## 2018-07-02 LAB — SALICYLATE LEVEL

## 2018-07-02 MED ORDER — DOXYCYCLINE HYCLATE 100 MG PO TABS: 100.0000 mg | ORAL_TABLET | Freq: Two times a day (BID) | ORAL | 0 refills | Status: DC

## 2018-07-02 MED ORDER — CLOTRIMAZOLE 1 % EX CREA: TOPICAL_CREAM | Freq: Two times a day (BID) | CUTANEOUS | 0 refills | Status: DC

## 2018-07-02 MED ORDER — PALIPERIDONE PALMITATE ER 156 MG/ML IM SUSY: 156.0000 mg | PREFILLED_SYRINGE | INTRAMUSCULAR | 0 refills | Status: DC

## 2018-07-02 NOTE — ED Triage Notes (Signed)
Pt d/c from behavioral health today. Stating that he began having suicidal thoughts with plan to jump off a bridge, jump in front of a train, and OD.

## 2018-07-02 NOTE — Progress Notes (Signed)
  [facility] Adult Case Management Discharge Plan :  Will you be returning to the same living situation after discharge:  Yes,  pt homeless, declines assistance At discharge, do you have transportation home?: No. Bus ticket provided. Do you have the ability to pay for your medications: No. Pt will work with [employer].  Release of information consent forms completed and in the chart;  Patient's signature needed at discharge.  Patient to Follow up at: Follow-up Information    [facility]. Go on 07/09/2018.   Specialty:  Behavioral Health Why:  Please attend your appt on Wednesday, 07/09/18, at 8:00am.  Please bring photo ID, and social security card. Contact information: [address] [place] [address] [phone]           Next level of care provider has access to [facility] Link:no  Safety Planning and Suicide Prevention discussed: Yes,  with mother  Have you used any form of tobacco in the last 30 days? (Cigarettes, Smokeless Tobacco, Cigars, and/or Pipes): Yes  Has patient been referred to the Quitline?: Patient refused referral  Patient has been referred for addiction treatment: Pt. refused referral  Lorri Frederick, LCSW 07/02/2018, 11:42 AM

## 2018-07-02 NOTE — Progress Notes (Signed)
Recreation Therapy Notes  INPATIENT RECREATION TR PLAN  Patient Details Name: Marc Hart MRN: [id number] DOB: Aug 04, 1991 Today's Date: 07/02/2018  Rec Therapy Plan Is patient appropriate for Therapeutic Recreation?: Yes Treatment times per week: about 3 days Estimated Length of Stay: 5-7 days TR Treatment/Interventions: Group participation (Comment)  Discharge Criteria Pt will be discharged from therapy if:: Discharged Treatment plan/goals/alternatives discussed and agreed upon by:: Patient/family  Discharge Summary Short term goals set: See patient care plan Short term goals met: Complete Progress toward goals comments: Groups attended Which groups?: Goal setting, Stress management Reason goals not met: None Therapeutic equipment acquired: N/A Reason patient discharged from therapy: Discharge from hospital Pt/family agrees with progress & goals achieved: Yes Date patient discharged from therapy: 07/02/18    Victorino Sparrow, LRT/CTRS  Ria Comment, Daltyn Degroat A 07/02/2018, 11:25 AM

## 2018-07-02 NOTE — Progress Notes (Signed)
Patient ID: Marc Hart, male   DOB: [birth year], 27 y.o.   MRN: [id number] Patient discharged to home self care in his own accord.  Discharge instructions reviewed with patient and patient acknowledged understanding of discharge instructions and receipt of all personal belongings.  Patient denies SI, HI and AVH upon discharge.

## 2018-07-02 NOTE — BHH Suicide Risk Assessment (Signed)
[facility] Discharge Suicide Risk Assessment   Principal Problem: Schizophrenia [facility]) Discharge Diagnoses:  Patient Active Problem List   Diagnosis Date Noted  . Schizophrenia (HCC) [F20.9] 06/26/2018  . Depression [F32.9]   . Polysubstance abuse (HCC) [F19.10]   . Substance induced mood disorder (HCC) [F19.94] 09/25/2015  . Alcohol dependence with uncomplicated withdrawal (HCC) [F10.230] 09/16/2015  . Alcohol-induced mood disorder (HCC) [F10.94] 09/16/2015  . Depressive disorder [F32.9] 09/12/2015  . History of schizophrenia [Z86.59]   . Schizoaffective disorder (HCC) [F25.9] 12/18/2014    Total Time spent with patient: 30 minutes    Psychiatric Specialty Exam:   Blood pressure 126/67, pulse (!) 111, temperature (!) 97.5 F (36.4 C), temperature source Oral, resp. rate 18, height 5\' 6"  (1.676 m), weight 80 kg, SpO2 99 %.Body mass index is 28.47 kg/m.   Mental Status Per Nursing Assessment::   On Admission:  NA  Demographic Factors:  Male, Adolescent or young adult, Caucasian, Low socioeconomic status, Living alone and Unemployed  Loss Factors: Financial problems/change in socioeconomic status  Historical Factors: Family history of mental illness or substance abuse and Impulsivity  Risk Reduction Factors:   Positive social support, Positive therapeutic relationship and Positive coping skills or problem solving skills  Continued Clinical Symptoms:  Alcohol/Substance Abuse/Dependencies Schizophrenia:   Paranoid or undifferentiated type  Cognitive Features That Contribute To Risk:  None    Suicide Risk:  Minimal: No identifiable suicidal ideation.  Patients presenting with no risk factors but with morbid ruminations; may be classified as minimal risk based on the severity of the depressive symptoms  Follow-up Information    Services, [facility]. Go on 07/10/2018.   Why:  Please attend your intake at [facility] at 7:45am on Thursday, 07/10/18.  Please bring your photo ID  to verify that you are a [facility] resident.  Contact information: [address] [place] [address] [phone]           Plan Of Care/Follow-up recommendations:  Activity:  as tolerated Diet:  normal Tests:  NA Other:  see above for DC plan  Micheal Likens, MD 07/02/2018, 8:36 AM

## 2018-07-02 NOTE — ED Notes (Signed)
ED Provider at bedside. 

## 2018-07-02 NOTE — ED Notes (Signed)
Pt given diet coke and wireless phone to make a phone call.

## 2018-07-02 NOTE — ED Notes (Signed)
Sitter requested, told by staffing one will be available at 2300.

## 2018-07-02 NOTE — ED Notes (Signed)
Spoke with Marian Sorrow from staffing office and notified her of need for sitter.  Pt wanded by security.  Pt belongings at triage desk at this time.

## 2018-07-02 NOTE — Progress Notes (Signed)
CSW spoke with Shayla at [facility] who can interview pt this AM for potential admit this evening.  CSW spoke with pt who now says she does not want admission to [facility] or [facility].  He wants to be released back to the streets.  He does not want any further referrals. Pt agreed to follow up at [facility].   Garner Nash, MSW, LCSW Clinical Social Worker 07/02/2018 9:37 AM

## 2018-07-02 NOTE — ED Provider Notes (Signed)
[facility] EMERGENCY DEPARTMENT Provider Note   CSN: [id number] Arrival date & time: 07/02/18  1949     History   Chief Complaint Chief Complaint  Patient presents with  . Suicidal    HPI Marc Hart is a 27 y.o. male.  The history is provided by the patient. No language interpreter was used.     27 year old male significant history of schizophrenia, and polysubstance abuse presenting with suicidal ideation.  Patient report he recently released from behavioral health for suicidal ideation.  After being discharged, patient report he does not have a place to stay.  He endorsed increased bouts of depression and feeling worthlessness and helplessness.  He endorsed passive suicidal ideation without any specific plan.  No report of homicidal ideation, auditory or visual hallucination.  He was told that if he needs help he should return to the ER.  He felt he does not have any social support or financial support.  He report having a left foot infection currently on doxycycline and not having a place to stay along with prolonged walking aggravates his symptoms.  He admits to drinking "a sip of beer" earlier today but denies any drug use.  He denies Tylenol abuse.  Past Medical History:  Diagnosis Date  . Schizophrenia (HCC)   . Substance abuse [facility])     Patient Active Problem List   Diagnosis Date Noted  . Schizophrenia (HCC) 06/26/2018  . Depression   . Polysubstance abuse (HCC)   . Substance induced mood disorder (HCC) 09/25/2015  . Alcohol dependence with uncomplicated withdrawal (HCC) 09/16/2015  . 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 Depressive disorder 09/12/2015  . History of schizophrenia   . Schizoaffective disorder (HCC) 12/18/2014    Past Surgical History:  Procedure Laterality Date  . OTHER SURGICAL HISTORY     Ear Surgery         Home Medications    Prior to Admission medications   Medication Sig Start Date End Date  Taking? Authorizing Provider  clotrimazole (LOTRIMIN) 1 % cream Apply topically 2 (two) times daily. 07/02/18   Micheal Likens, MD  doxycycline (VIBRA-TABS) 100 MG tablet Take 1 tablet (100 mg total) by mouth every 12 (twelve) hours. 07/02/18   Micheal Likens, MD  paliperidone (INVEGA SUSTENNA) 156 MG/ML SUSY injection Inject 1 mL (156 mg total) into the muscle every 28 (twenty-eight) days. Last administration 07/02/18. 07/02/18   Micheal Likens, MD    Family History No family history on file.  Social History Social History   Tobacco Use  . Smoking status: Current Some Day Smoker  . Smokeless tobacco: Never Used  Substance Use Topics  . Alcohol use: Yes  . Drug use: Yes    Types: Marijuana, IV, Cocaine, Methamphetamines    Comment: heroin     Allergies   Amoxicillin; Ibuprofen; Latex; Other; Peanut-containing drug products; Tylenol [acetaminophen]; Other; and Tylenol [acetaminophen]   Review of Systems Review of Systems  All other systems reviewed and are negative.    Physical Exam Updated Vital Signs BP 124/73 (BP Location: Right Arm)   Pulse (!) 110   Temp 98.9 F (37.2 C) (Oral)   Resp 18   Ht 5\' 11"  (1.803 m)   Wt 86.2 kg   SpO2 100%   BMI 26.50 kg/m   Physical Exam  Constitutional: He appears well-developed and well-nourished. No distress.  HENT:  Head: Atraumatic.  Eyes: Conjunctivae are normal.  Neck: Neck supple.  Cardiovascular: Normal  rate and regular rhythm.  Pulmonary/Chest: Effort normal and breath sounds normal.  Abdominal: Soft.  Neurological: He is alert. GCS eye subscore is 4. GCS verbal subscore is 5. GCS motor subscore is 6.  Skin: No rash noted.  Psychiatric: He has a normal mood and affect. His speech is normal and behavior is normal. Thought content is not paranoid. He expresses suicidal ideation. He expresses no homicidal ideation.  Nursing note and vitals reviewed.    ED Treatments / Results   Labs (all labs ordered are listed, but only abnormal results are displayed) Labs Reviewed  COMPREHENSIVE METABOLIC PANEL - Abnormal; Notable for the following components:      Result Value   Glucose, Bld 158 (*)    Total Protein 6.4 (*)    AST 150 (*)    ALT 276 (*)    All other components within normal limits  ACETAMINOPHEN LEVEL - Abnormal; Notable for the following components:   Acetaminophen (Tylenol), Serum <10 (*)    All other components within normal limits  ETHANOL  SALICYLATE LEVEL  CBC  RAPID URINE DRUG SCREEN, HOSP PERFORMED    EKG None  Radiology No results found.  Procedures Procedures (including critical care time)  Medications Ordered in ED Medications  zolpidem (AMBIEN) tablet 5 mg (has no administration in time range)  ondansetron (ZOFRAN) tablet 4 mg (has no administration in time range)  alum & mag hydroxide-simeth (MAALOX/MYLANTA) 200-200-20 MG/5ML suspension 30 mL (has no administration in time range)  nicotine (NICODERM CQ - dosed in mg/24 hours) patch 21 mg (has no administration in time range)  doxycycline (VIBRA-TABS) tablet 100 mg (has no administration in time range)  clotrimazole (LOTRIMIN) 1 % cream (has no administration in time range)     Initial Impression / Assessment and Plan / ED Course  I have reviewed the triage vital signs and the nursing notes.  Pertinent labs & imaging results that were available during my care of the patient were reviewed by me and considered in my medical decision making (see chart for details).     BP 124/73 (BP Location: Right Arm)   Pulse (!) 110   Temp 98.9 F (37.2 C) (Oral)   Resp 18   Ht 5\' 11"  (1.803 m)   Wt 86.2 kg   SpO2 100%   BMI 26.50 kg/m    Final Clinical Impressions(s) / ED Diagnoses   Final diagnoses:  Suicidal thoughts    ED Discharge Orders    None     10:30 PM Patient voiced passive suicidal thoughts without any specific plan.  He reportedly recently discharged from  behavioral health after 5 days of inpatient care due to having suicidal ideation.  He attributed his SI to being homeless and feels as if he is not getting any kind of assistance.  Does have history of polysubstance abuse, rapid drug screen is normal, Tylenol and salicylate level all within normal limit as well as alcohol level.  However, evidence of transaminitis with AST 150, ALT 276 which is higher than his baseline.  He does not have any complaints of abdominal pain nausea vomiting and no evidence of Tylenol toxicity.  Will obtain hepatitis panel due to his history of polysubstance use.  10:35 PM Upon reviewing recent [facility] notes, pt was noted to have transaminitis and a hepatitis panel was obtained showing elevated Hepatitis C antibody.  Pt does admits to IVDU.  He will need to f/u with ID for further management of his transaminitis likely 2/2  HepC.    12:37 AM Pt is medically cleared.  TTS has seen and evaluate pt and felt pt should be observed overnight for AM psych eval.    Fayrene Helper, PA-C 07/03/18 0040    Cathren Laine, MD 07/04/18 1259

## 2018-07-02 NOTE — ED Notes (Signed)
Belongings inventoried and placed in locker #5 
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
## 2018-07-02 NOTE — ED Provider Notes (Signed)
Patient placed in Quick Look pathway, seen and evaluated   Chief Complaint: suicidal thoughts  HPI:   Pt reports he was just discharged from behavioral health today.  Pt reports he is feeling suicidal and he was told to come here if he felt suicidal  ROS: anxiety  Physical Exam:   Gen: No distress  Neuro: Awake and Alert  Skin: Warm    Focused Exam: Lung clear Heart rrr   Initiation of care has begun. The patient has been counseled on the process, plan, and necessity for staying for the completion/evaluation, and the remainder of the medical screening examination   Osie Cheeks 07/02/18 2018    Virgina Norfolk, DO 07/03/18 [phone]

## 2018-07-02 NOTE — Plan of Care (Signed)
Pt was able to engaged in group sessions in a calm and appropriate manner at completion of recreation therapy group sessions.   Caroll Rancher, LRT/CTRS

## 2018-07-02 NOTE — Progress Notes (Signed)
Recreation Therapy Notes  Date: 10.30.19 Time: 1000 Location: 500 Hall Dayroom   Group Topic: Stress Management  Goal Area(s) Addresses:  Patient will verbalize importance of using healthy stress management.  Patient will identify positive emotions associated with healthy stress management.   Behavioral Response: Minimal  Intervention: Stress Management  Activity :  Guided Imagery & Meditation.  LRT introduced the stress management techniques of guided imagery and meditation.  LRT read a script for patients to envision themselves out looking at the starry sky at night.  LRT then played a meditation to help patients build up their resilience to uncomfortable situations.  Education:  Stress Management, Discharge Planning.   Education Outcome: Acknowledges edcuation/In group clarification offered/Needs additional education  Clinical Observations/Feedback: Pt arrived late to group.  Pt was quiet and attempted to focus on the techniques.  Pt was fidgety by the constant shaking of his legs.  Pt was appropriate during group session.     Caroll Rancher, LRT/CTRS      Lillia Abed, Jenilyn Magana A 07/02/2018 11:50 AM
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
## 2018-07-03 ENCOUNTER — Encounter (HOSPITAL_COMMUNITY): Payer: Self-pay | Admitting: Registered Nurse

## 2018-07-03 ENCOUNTER — Other Ambulatory Visit: Payer: Self-pay

## 2018-07-03 ENCOUNTER — Encounter (HOSPITAL_COMMUNITY): Payer: Self-pay | Admitting: Emergency Medicine

## 2018-07-03 ENCOUNTER — Emergency Department (HOSPITAL_COMMUNITY)
Admission: EM | Admit: 2018-07-03 | Discharge: 2018-07-04 | Disposition: A | Discharge status: Home / Self Care | Payer: Self-pay | Attending: Emergency Medicine | Admitting: Emergency Medicine

## 2018-07-03 DIAGNOSIS — R45851 Suicidal ideations: Secondary | ICD-10-CM

## 2018-07-03 DIAGNOSIS — Z0489 Encounter for examination and observation for other specified reasons: Secondary | ICD-10-CM | POA: Insufficient documentation

## 2018-07-03 DIAGNOSIS — Z5321 Procedure and treatment not carried out due to patient leaving prior to being seen by health care provider: Secondary | ICD-10-CM | POA: Insufficient documentation

## 2018-07-03 DIAGNOSIS — F209 Schizophrenia, unspecified: Secondary | ICD-10-CM

## 2018-07-03 LAB — CBC
HCT: 45.2 % (ref 39.0–52.0)
Hemoglobin: 14.8 g/dL (ref 13.0–17.0)
MCH: 29 pg (ref 26.0–34.0)
MCHC: 32.7 g/dL (ref 30.0–36.0)
MCV: 88.5 fL (ref 80.0–100.0)
PLATELETS: 257 10*3/uL (ref 150–400)
RBC: 5.11 MIL/uL (ref 4.22–5.81)
RDW: 13.2 % (ref 11.5–15.5)
WBC: 9.1 10*3/uL (ref 4.0–10.5)
nRBC: 0 % (ref 0.0–0.2)

## 2018-07-03 LAB — COMPREHENSIVE METABOLIC PANEL
ALK PHOS: 77 U/L (ref 38–126)
ALT: 298 U/L — AB (ref 0–44)
AST: 148 U/L — ABNORMAL HIGH (ref 15–41)
Albumin: 3.9 g/dL (ref 3.5–5.0)
Anion gap: 11 (ref 5–15)
BILIRUBIN TOTAL: 0.5 mg/dL (ref 0.3–1.2)
BUN: 15 mg/dL (ref 6–20)
CALCIUM: 9.2 mg/dL (ref 8.9–10.3)
CHLORIDE: 102 mmol/L (ref 98–111)
CO2: 25 mmol/L (ref 22–32)
CREATININE: 0.9 mg/dL (ref 0.61–1.24)
Glucose, Bld: 112 mg/dL — ABNORMAL HIGH (ref 70–99)
Potassium: 3.5 mmol/L (ref 3.5–5.1)
Sodium: 138 mmol/L (ref 135–145)
TOTAL PROTEIN: 6.7 g/dL (ref 6.5–8.1)

## 2018-07-03 LAB — ETHANOL

## 2018-07-03 MED ORDER — ALUM & MAG HYDROXIDE-SIMETH 200-200-20 MG/5ML PO SUSP: 30.0000 mL | Freq: Four times a day (QID) | ORAL | Status: DC | PRN

## 2018-07-03 MED ORDER — ONDANSETRON HCL 4 MG PO TABS: 4.0000 mg | ORAL_TABLET | Freq: Three times a day (TID) | ORAL | Status: DC | PRN

## 2018-07-03 MED ORDER — DOXYCYCLINE HYCLATE 100 MG PO TABS
100.0000 mg | ORAL_TABLET | Freq: Two times a day (BID) | ORAL | Status: DC
  Administered 2018-07-03 (×2): 100 mg via ORAL
  Filled 2018-07-03 (×2): qty 1

## 2018-07-03 MED ORDER — CLOTRIMAZOLE 1 % EX CREA
TOPICAL_CREAM | Freq: Two times a day (BID) | CUTANEOUS | Status: DC
  Administered 2018-07-03 (×2): via TOPICAL
  Filled 2018-07-03: qty 15

## 2018-07-03 MED ORDER — ZOLPIDEM TARTRATE 5 MG PO TABS: 5.0000 mg | ORAL_TABLET | Freq: Every evening | ORAL | Status: DC | PRN

## 2018-07-03 MED ORDER — NICOTINE 21 MG/24HR TD PT24
21.0000 mg | MEDICATED_PATCH | Freq: Every day | TRANSDERMAL | Status: DC
  Administered 2018-07-03: 21 mg via TRANSDERMAL
  Filled 2018-07-03: qty 1

## 2018-07-03 NOTE — ED Provider Notes (Signed)
By psychiatry and cleared for discharge   Lorre Nick, MD 07/03/18 1104

## 2018-07-03 NOTE — Discharge Instructions (Signed)
[facility], [facility]. Go on 07/10/2018.   Why:  Please attend your intake at [facility] at 7:45am on Thursday, 07/10/18.  Please bring your photo ID to verify that you are a [facility] resident.  Contact information: [address] [place] [address] [phone]
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
## 2018-07-03 NOTE — ED Notes (Addendum)
Patient was given a Ginger Ale and [place] for snack. A Regular Diet was ordered for Lunch.

## 2018-07-03 NOTE — ED Triage Notes (Addendum)
Pt reports smoked "just one puff" of unknown substance about 5 hours ago. Pt now has dooling, states he cannot shut his jaw but is able to, twitching and shortness of breath. Denies SI/HI at this time. Pt thinks it could be K2 but is unsure of what he took. States that he is worried that it could kill him and he wants help.

## 2018-07-04 NOTE — ED Notes (Signed)
Pt stopped this EMT in the hall and informed this EMT that "my toe is infected, will you give me something for it?" This EMT stated the EDP would need to be the one to give him something but if he continued to be concerned to inform the nurse in triage.

## 2018-07-04 NOTE — ED Notes (Signed)
PT became beligerant, yelling at staff, calling them names and yelling profanities.  Security and [employer] talked to pt, pt calmed down a little.  At one point pt stripped down to his underwear and was standing in the hall.  Pt instructed to put clothing back on which he did.  Once again began yelling, got up and started toward door.  He stopped and began yelling at this RN again.  Security and [employer] escorted pt out.

## 2018-07-07 ENCOUNTER — Other Ambulatory Visit: Payer: Self-pay

## 2018-07-07 ENCOUNTER — Emergency Department (HOSPITAL_COMMUNITY): Admission: EM | Admit: 2018-07-07 | Discharge: 2018-07-07 | Discharge status: Against Medical Advice | Payer: Self-pay

## 2018-07-07 ENCOUNTER — Telehealth: Payer: Self-pay | Admitting: *Deleted

## 2018-07-07 NOTE — Telephone Encounter (Signed)
-----   Message from Judyann Munson, MD sent at 06/27/2018  8:18 AM EDT ----- We will have him scheduled for our hep c clinic. 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 Currently inpatient at [facility]. Does not have followup.  Thanks.

## 2018-07-07 NOTE — Telephone Encounter (Signed)
Message forwarded to [facility] schedulers per Dr Drue Second request.

## 2018-07-21 ENCOUNTER — Emergency Department (HOSPITAL_COMMUNITY)
Admission: EM | Admit: 2018-07-21 | Discharge: 2018-07-21 | Disposition: A | Discharge status: Home / Self Care | Payer: Self-pay | Attending: Emergency Medicine | Admitting: Emergency Medicine

## 2018-07-21 ENCOUNTER — Encounter (HOSPITAL_COMMUNITY): Payer: Self-pay | Admitting: Emergency Medicine

## 2018-07-21 DIAGNOSIS — F209 Schizophrenia, unspecified: Secondary | ICD-10-CM | POA: Insufficient documentation

## 2018-07-21 DIAGNOSIS — Z79899 Other long term (current) drug therapy: Secondary | ICD-10-CM | POA: Insufficient documentation

## 2018-07-21 DIAGNOSIS — Z91040 Latex allergy status: Secondary | ICD-10-CM | POA: Insufficient documentation

## 2018-07-21 DIAGNOSIS — Z91010 Allergy to peanuts: Secondary | ICD-10-CM | POA: Insufficient documentation

## 2018-07-21 MED ORDER — PALIPERIDONE PALMITATE ER 156 MG/ML IM SUSY
156.0000 mg | PREFILLED_SYRINGE | Freq: Once | INTRAMUSCULAR | Status: AC
  Administered 2018-07-21: 156 mg via INTRAMUSCULAR
  Filled 2018-07-21: qty 1

## 2018-07-21 NOTE — ED Notes (Signed)
Called pharmacy to see about medication being verified and length of time before getting medication [facility]. Justin in pharmacy stated that they are waiting [facility] about medication.

## 2018-07-21 NOTE — ED Provider Notes (Signed)
[facility]-EMERGENCY DEPT Provider Note   CSN: [id number] Arrival date & time: 07/21/18  1012     History   Chief Complaint Chief Complaint  Patient presents with  . needs injection    HPI Marc Hart is a 27 y.o. male.  HPI Pt brought to ER by his father requesting his monthly invega injection. Issues with following up tomorrow. No other issues   Past Medical History:  Diagnosis Date  . Schizophrenia (HCC)   . Substance abuse [facility])     Patient Active Problem List   Diagnosis Date Noted  . Suicidal thoughts   . Schizophrenia (HCC) 06/26/2018  . Depression   . Polysubstance abuse (H[facility])   . Substance induced mood disorder ([facility]) 09/25/2015  . Alcohol dependence with uncomplicated withdrawal (HCC) 09/16/2015  . 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 Depressive disorder 09/12/2015  . History of schizophrenia   . Schizoaffective disorder (HCC) 12/18/2014    Past Surgical History:  Procedure Laterality Date  . OTHER SURGICAL HISTORY     Ear Surgery         Home Medications    Prior to Admission medications   Medication Sig Start Date End Date Taking? Authorizing Provider  clotrimazole (LOTRIMIN) 1 % cream Apply topically 2 (two) times daily. 07/02/18   Micheal Likens, MD  doxycycline (VIBRA-TABS) 100 MG tablet Take 1 tablet (100 mg total) by mouth every 12 (twelve) hours. 07/02/18   Micheal Likens, MD  paliperidone (INVEGA SUSTENNA) 156 MG/ML SUSY injection Inject 1 mL (156 mg total) into the muscle every 28 (twenty-eight) days. Last administration 07/02/18. 07/02/18   Micheal Likens, MD    Family History No family history on file.  Social History Social History   Tobacco Use  . Smoking status: Current Some Day Smoker  . Smokeless tobacco: Never Used  Substance Use Topics  . Alcohol use: Yes  . Drug use: Yes    Types: Marijuana, IV, Cocaine, Methamphetamines    Comment: heroin      Allergies   Amoxicillin; Ibuprofen; Latex; Other; Peanut-containing drug products; Tylenol [acetaminophen]; Other; and Tylenol [acetaminophen]   Review of Systems Review of Systems  All other systems reviewed and are negative.    Physical Exam Updated Vital Signs BP 128/85 (BP Location: Right Arm)   Pulse (!) 109   Temp 98.1 F (36.7 C) (Oral)   Resp 16   Ht 5\' 11"  (1.803 m)   Wt 86.2 kg   SpO2 98%   BMI 26.50 kg/m   Physical Exam  Constitutional: He is oriented to person, place, and time. He appears well-developed and well-nourished.  HENT:  Head: Normocephalic.  Eyes: EOM are normal.  Neck: Normal range of motion.  Pulmonary/Chest: Effort normal.  Abdominal: He exhibits no distension.  Musculoskeletal: Normal range of motion.  Neurological: He is alert and oriented to person, place, and time.  Psychiatric: He has a normal mood and affect.  Nursing note and vitals reviewed.    ED Treatments / Results  Labs (all labs ordered are listed, but only abnormal results are displayed) Labs Reviewed - No data to display  EKG None  Radiology No results found.  Procedures Procedures (including critical care time)  Medications Ordered in ED Medications  paliperidone (INVEGA SUSTENNA) injection 156 mg (156 mg Intramuscular Given 07/21/18 1206)     Initial Impression / Assessment and Plan / ED Course  I have reviewed the triage vital signs and the nursing notes.  Pertinent labs & imaging results that were available during my care of the patient were reviewed by me and considered in my medical decision making (see chart for details).     invega injection given  Final Clinical Impressions(s) / ED Diagnoses   Final diagnoses:  Schizophrenia, unspecified type [facility](HCC)    ED Discharge Orders    None       Azalia Bilisampos, Kruze Atchley, MD 07/21/18 1212

## 2018-07-21 NOTE — ED Notes (Signed)
Bed: [facility] Expected date:  Expected time:  Means of arrival:  Comments: 

## 2018-07-21 NOTE — ED Triage Notes (Signed)
Per pt/father, states he needs his Invega injection-states he follows up with [facility] but they don't give injections on Monday-states he has an appointment tomorrow with an inpatient substance abuse center and cant get injection tomorrow because he will miss getting into [facility]

## 2018-07-22 ENCOUNTER — Encounter (HOSPITAL_COMMUNITY): Payer: Self-pay

## 2018-07-22 ENCOUNTER — Emergency Department (HOSPITAL_COMMUNITY)
Admission: EM | Admit: 2018-07-22 | Discharge: 2018-07-22 | Disposition: A | Discharge status: Home / Self Care | Payer: Self-pay | Attending: Emergency Medicine | Admitting: Emergency Medicine

## 2018-07-22 DIAGNOSIS — Z91040 Latex allergy status: Secondary | ICD-10-CM | POA: Insufficient documentation

## 2018-07-22 DIAGNOSIS — M791 Myalgia, unspecified site: Secondary | ICD-10-CM | POA: Insufficient documentation

## 2018-07-22 DIAGNOSIS — Z91010 Allergy to peanuts: Secondary | ICD-10-CM | POA: Insufficient documentation

## 2018-07-22 DIAGNOSIS — Z79899 Other long term (current) drug therapy: Secondary | ICD-10-CM | POA: Insufficient documentation

## 2018-07-22 LAB — BASIC METABOLIC PANEL
Anion gap: 11 (ref 5–15)
BUN: 10 mg/dL (ref 6–20)
CALCIUM: 9.6 mg/dL (ref 8.9–10.3)
CO2: 24 mmol/L (ref 22–32)
CREATININE: 0.97 mg/dL (ref 0.61–1.24)
Chloride: 105 mmol/L (ref 98–111)
GFR calc Af Amer: >60 mL/min (ref >60)
Glucose, Bld: 95 mg/dL (ref 70–99)
Potassium: 3.8 mmol/L (ref 3.5–5.1)
SODIUM: 140 mmol/L (ref 135–145)

## 2018-07-22 LAB — CBC WITH DIFFERENTIAL/PLATELET
Abs Immature Granulocytes: 0.02 10*3/uL (ref 0.00–0.07)
BASOS PCT: 1 %
Basophils Absolute: 0 10*3/uL (ref 0.0–0.1)
EOS PCT: 1 %
Eosinophils Absolute: 0.1 10*3/uL (ref 0.0–0.5)
HCT: 48.2 % (ref 39.0–52.0)
HEMOGLOBIN: 16.1 g/dL (ref 13.0–17.0)
Immature Granulocytes: 0 %
Lymphocytes Relative: 18 %
Lymphs Abs: 1.1 10*3/uL (ref 0.7–4.0)
MCH: 29.3 pg (ref 26.0–34.0)
MCHC: 33.4 g/dL (ref 30.0–36.0)
MCV: 87.6 fL (ref 80.0–100.0)
MONO ABS: 0.8 10*3/uL (ref 0.1–1.0)
MONOS PCT: 13 %
Neutro Abs: 4.2 10*3/uL (ref 1.7–7.7)
Neutrophils Relative %: 67 %
Platelets: 244 10*3/uL (ref 150–400)
RBC: 5.5 MIL/uL (ref 4.22–5.81)
RDW: 13.2 % (ref 11.5–15.5)
WBC: 6.3 10*3/uL (ref 4.0–10.5)
nRBC: 0 % (ref 0.0–0.2)

## 2018-07-22 LAB — URINALYSIS, ROUTINE W REFLEX MICROSCOPIC
BILIRUBIN URINE: NEGATIVE
GLUCOSE, UA: NEGATIVE mg/dL
HGB URINE DIPSTICK: NEGATIVE
Ketones, ur: NEGATIVE mg/dL
Leukocytes, UA: NEGATIVE
Nitrite: NEGATIVE
Protein, ur: NEGATIVE mg/dL
SPECIFIC GRAVITY, URINE: 1.016 (ref 1.005–1.030)
pH: 6 (ref 5.0–8.0)

## 2018-07-22 LAB — CK: CK TOTAL: 41 U/L — AB (ref 49–397)

## 2018-07-22 MED ORDER — IBUPROFEN 400 MG PO TABS
600.0000 mg | ORAL_TABLET | Freq: Once | ORAL | Status: AC
  Administered 2018-07-22: 600 mg via ORAL
  Filled 2018-07-22: qty 1

## 2018-07-22 NOTE — Discharge Instructions (Addendum)
You are leaving AGAINST MEDICAL ADVICE.  If you change your mind, develop worsening symptoms, develop dark or tea colored/bloody urine, fevers, or any other new/concerning symptoms then return to the ER for evaluation.  You may also follow-up with your primary care physician.
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
## 2018-07-25 ENCOUNTER — Emergency Department (HOSPITAL_COMMUNITY)
Admission: EM | Admit: 2018-07-25 | Discharge: 2018-07-25 | Disposition: A | Discharge status: Home / Self Care | Payer: Self-pay | Attending: Emergency Medicine | Admitting: Emergency Medicine

## 2018-07-25 ENCOUNTER — Encounter (HOSPITAL_COMMUNITY): Payer: Self-pay | Admitting: Emergency Medicine

## 2018-07-25 ENCOUNTER — Other Ambulatory Visit: Payer: Self-pay

## 2018-07-25 DIAGNOSIS — F17210 Nicotine dependence, cigarettes, uncomplicated: Secondary | ICD-10-CM | POA: Insufficient documentation

## 2018-07-25 DIAGNOSIS — Z5900 Homelessness unspecified: Secondary | ICD-10-CM

## 2018-07-25 DIAGNOSIS — F1910 Other psychoactive substance abuse, uncomplicated: Secondary | ICD-10-CM | POA: Insufficient documentation

## 2018-07-25 DIAGNOSIS — Z91040 Latex allergy status: Secondary | ICD-10-CM | POA: Insufficient documentation

## 2018-07-25 DIAGNOSIS — F1010 Alcohol abuse, uncomplicated: Secondary | ICD-10-CM | POA: Insufficient documentation

## 2018-07-25 DIAGNOSIS — F209 Schizophrenia, unspecified: Secondary | ICD-10-CM | POA: Insufficient documentation

## 2018-07-25 NOTE — ED Notes (Signed)
Pt ambulated out of ED in no distress. He lit his cigarette in triage and walked out after being told several times not to light his cigarette in the building. Steady gate noted, he was carrying his belongings.

## 2018-07-25 NOTE — ED Provider Notes (Signed)
[facility]-EMERGENCY DEPT Provider Note   CSN: [id number] Arrival date & time: 07/25/18  1318     History   Chief Complaint Chief Complaint  Patient presents with  . Alcohol Intoxication    HPI Marc Hart is a 27 y.o. male.  HPI   27 year old male brought in by EMS for evaluation.  Apparently he is homeless and was sleeping in a location that he is not allowed to.  Please were called.  Noted to have slurred speech so EMS was involved.  He states that he has been drinking today.  He has no desire to stop.  He denies any suicidal homicidal ideation.  Denies hallucinations.  He would like to leave.  Past Medical History:  Diagnosis Date  . Schizophrenia (HCC)   . Substance abuse [facility])     Patient Active Problem List   Diagnosis Date Noted  . Suicidal thoughts   . Schizophrenia (HCC) 06/26/2018  . Depression   . Polysubstance abuse (HCC)   . Substance induced mood disorder (HCC) 09/25/2015  . Alcohol dependence with uncomplicated withdrawal (HCC) 09/16/2015  . 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 Depressive disorder 09/12/2015  . History of schizophrenia   . Schizoaffective disorder (HCC) 12/18/2014    Past Surgical History:  Procedure Laterality Date  . OTHER SURGICAL HISTORY     Ear Surgery         Home Medications    Prior to Admission medications   Medication Sig Start Date End Date Taking? Authorizing Provider  clotrimazole (LOTRIMIN) 1 % cream Apply topically 2 (two) times daily. 07/02/18   Micheal Likens, MD  doxycycline (VIBRA-TABS) 100 MG tablet Take 1 tablet (100 mg total) by mouth every 12 (twelve) hours. 07/02/18   Micheal Likens, MD  paliperidone (INVEGA SUSTENNA) 156 MG/ML SUSY injection Inject 1 mL (156 mg total) into the muscle every 28 (twenty-eight) days. Last administration 07/02/18. 07/02/18   Micheal Likens, MD    Family History No family history on file.  Social  History Social History   Tobacco Use  . Smoking status: Current Some Day Smoker  . Smokeless tobacco: Never Used  Substance Use Topics  . Alcohol use: Yes    Comment: 6-9 tallboy beers/day  . Drug use: Yes    Types: Marijuana, IV, Cocaine, Methamphetamines    Comment: heroin 1gram/day     Allergies   Amoxicillin; Ibuprofen; Latex; Other; Peanut-containing drug products; Tylenol [acetaminophen]; Other; and Tylenol [acetaminophen]   Review of Systems Review of Systems   Physical Exam Updated Vital Signs BP 121/77 (BP Location: Left Arm)   Pulse 98   Temp 98.1 F (36.7 C) (Oral)   Resp 16   SpO2 100%   Physical Exam  Constitutional: He appears well-developed and well-nourished. No distress.  HENT:  Head: Normocephalic and atraumatic.  Eyes: Conjunctivae are normal. Right eye exhibits no discharge. Left eye exhibits no discharge.  Neck: Neck supple.  Cardiovascular: Normal rate, regular rhythm and normal heart sounds. Exam reveals no gallop and no friction rub.  No murmur heard. Pulmonary/Chest: Effort normal and breath sounds normal. No respiratory distress.  Abdominal: Soft. He exhibits no distension. There is no tenderness.  Musculoskeletal: He exhibits no edema or tenderness.  Neurological: He is alert.  Very drowsy but opens eyes to voice and answering all my questions appropriately.  Skin: Skin is warm and dry.  Nursing note and vitals reviewed.    ED Treatments / Results  Labs (  all labs ordered are listed, but only abnormal results are displayed) Labs Reviewed - No data to display  EKG None  Radiology No results found.  Procedures Procedures (including critical care time)  Medications Ordered in ED Medications - No data to display   Initial Impression / Assessment and Plan / ED Course  I have reviewed the triage vital signs and the nursing notes.  Pertinent labs & imaging results that were available during my care of the patient were reviewed  by me and considered in my medical decision making (see chart for details).     27 year old male with alcohol intoxication.  He is homeless and was picked up by EMS after he was found sleeping in a location that he was not allowed to.  He has no acute complaints.  He does admit to drinking.  Denies any other substance abuse currently.  He is not interested in alcohol cessation.  He was discharged in no acute distress.  Final Clinical Impressions(s) / ED Diagnoses   Final diagnoses:  Homeless    ED Discharge Orders    None       Raeford RazorKohut, Messina Kosinski, MD 08/04/18 1626

## 2018-07-25 NOTE — ED Notes (Signed)
Pt requesting to leave. IV removed.

## 2018-07-25 NOTE — ED Notes (Signed)
Pt reports to drinking one shot.

## 2018-07-25 NOTE — ED Notes (Signed)
Patient gave nursing staff permission to call his mother, writer left message with mom

## 2018-07-25 NOTE — ED Triage Notes (Signed)
Pt was picked up by [facility] on side of road for AMS. Vitals: 114/76, 100HR, 20R, 97% CBG 109, 20g left hand.

## 2018-07-25 NOTE — ED Notes (Signed)
Pt up for discharge home at this time.  Pt too intoxicated to understand discharge instructions or to ambulate safely from ED. Pt will remain sleeping in triage room until more sober.

## 2018-07-25 NOTE — ED Notes (Signed)
Patient gave nursing staff permission to call dad to let him know he is here-writer attempted to call father but mailbox was full

## 2018-07-25 NOTE — ED Notes (Signed)
Bed: [facility] Expected date:  Expected time:  Means of arrival:  Comments: 

## 2018-07-25 NOTE — ED Notes (Signed)
Mother called stating that patient is schizophrenic with drug abuse problem. Family has tried multiple times to get patient into drug rehab facility and pt refuses to stay to get help. Mother Adds that he is homeless by choice due to not wanting to get help for drug problem, so no one will be coming to pick patient up from ED.

## 2018-07-27 ENCOUNTER — Encounter (HOSPITAL_COMMUNITY): Payer: Self-pay | Admitting: Emergency Medicine

## 2018-07-27 ENCOUNTER — Other Ambulatory Visit: Payer: Self-pay

## 2018-07-27 ENCOUNTER — Emergency Department (HOSPITAL_COMMUNITY): Payer: Self-pay

## 2018-07-27 ENCOUNTER — Emergency Department (HOSPITAL_COMMUNITY)
Admission: EM | Admit: 2018-07-27 | Discharge: 2018-07-28 | Disposition: A | Discharge status: Home / Self Care | Payer: Self-pay | Attending: Emergency Medicine | Admitting: Emergency Medicine

## 2018-07-27 DIAGNOSIS — R45851 Suicidal ideations: Secondary | ICD-10-CM | POA: Insufficient documentation

## 2018-07-27 DIAGNOSIS — F1020 Alcohol dependence, uncomplicated: Secondary | ICD-10-CM | POA: Insufficient documentation

## 2018-07-27 DIAGNOSIS — Y9389 Activity, other specified: Secondary | ICD-10-CM | POA: Insufficient documentation

## 2018-07-27 DIAGNOSIS — F10230 Alcohol dependence with withdrawal, uncomplicated: Secondary | ICD-10-CM | POA: Diagnosis present

## 2018-07-27 DIAGNOSIS — F17210 Nicotine dependence, cigarettes, uncomplicated: Secondary | ICD-10-CM | POA: Insufficient documentation

## 2018-07-27 DIAGNOSIS — W010XXA Fall on same level from slipping, tripping and stumbling without subsequent striking against object, initial encounter: Secondary | ICD-10-CM | POA: Insufficient documentation

## 2018-07-27 DIAGNOSIS — S0993XA Unspecified injury of face, initial encounter: Secondary | ICD-10-CM | POA: Insufficient documentation

## 2018-07-27 DIAGNOSIS — Z79899 Other long term (current) drug therapy: Secondary | ICD-10-CM | POA: Insufficient documentation

## 2018-07-27 DIAGNOSIS — F209 Schizophrenia, unspecified: Secondary | ICD-10-CM | POA: Insufficient documentation

## 2018-07-27 DIAGNOSIS — Y998 Other external cause status: Secondary | ICD-10-CM | POA: Insufficient documentation

## 2018-07-27 DIAGNOSIS — F1010 Alcohol abuse, uncomplicated: Secondary | ICD-10-CM

## 2018-07-27 DIAGNOSIS — S0990XA Unspecified injury of head, initial encounter: Secondary | ICD-10-CM | POA: Insufficient documentation

## 2018-07-27 DIAGNOSIS — F259 Schizoaffective disorder, unspecified: Secondary | ICD-10-CM | POA: Diagnosis present

## 2018-07-27 DIAGNOSIS — Y9289 Other specified places as the place of occurrence of the external cause: Secondary | ICD-10-CM | POA: Insufficient documentation

## 2018-07-27 LAB — RAPID URINE DRUG SCREEN, HOSP PERFORMED
AMPHETAMINES: NOT DETECTED
Barbiturates: NOT DETECTED
Benzodiazepines: NOT DETECTED
Cocaine: NOT DETECTED
OPIATES: NOT DETECTED
TETRAHYDROCANNABINOL: POSITIVE — AB

## 2018-07-27 LAB — COMPREHENSIVE METABOLIC PANEL
ALBUMIN: 3.7 g/dL (ref 3.5–5.0)
ALK PHOS: 80 U/L (ref 38–126)
ALT: 636 U/L — ABNORMAL HIGH (ref 0–44)
ANION GAP: 8 (ref 5–15)
AST: 329 U/L — AB (ref 15–41)
BILIRUBIN TOTAL: 0.8 mg/dL (ref 0.3–1.2)
BUN: 6 mg/dL (ref 6–20)
CHLORIDE: 110 mmol/L (ref 98–111)
CO2: 27 mmol/L (ref 22–32)
Calcium: 8.6 mg/dL — ABNORMAL LOW (ref 8.9–10.3)
Creatinine, Ser: 0.8 mg/dL (ref 0.61–1.24)
GFR calc non Af Amer: >60 mL/min (ref >60)
GLUCOSE: 105 mg/dL — AB (ref 70–99)
POTASSIUM: 3.7 mmol/L (ref 3.5–5.1)
Sodium: 145 mmol/L (ref 135–145)
Total Protein: 6.3 g/dL — ABNORMAL LOW (ref 6.5–8.1)

## 2018-07-27 LAB — ETHANOL: Alcohol, Ethyl (B): 213 mg/dL — ABNORMAL HIGH (ref <10)

## 2018-07-27 LAB — CBC
HCT: 45.8 % (ref 39.0–52.0)
HEMOGLOBIN: 15.1 g/dL (ref 13.0–17.0)
MCH: 29.9 pg (ref 26.0–34.0)
MCHC: 33 g/dL (ref 30.0–36.0)
MCV: 90.7 fL (ref 80.0–100.0)
NRBC: 0 % (ref 0.0–0.2)
PLATELETS: 213 10*3/uL (ref 150–400)
RBC: 5.05 MIL/uL (ref 4.22–5.81)
RDW: 13.6 % (ref 11.5–15.5)
WBC: 6.3 10*3/uL (ref 4.0–10.5)

## 2018-07-27 LAB — ACETAMINOPHEN LEVEL

## 2018-07-27 LAB — SALICYLATE LEVEL

## 2018-07-27 MED ORDER — LORAZEPAM 2 MG/ML IJ SOLN: 0.0000 mg | Freq: Four times a day (QID) | INTRAMUSCULAR | Status: DC

## 2018-07-27 MED ORDER — LORAZEPAM 2 MG/ML IJ SOLN: 0.0000 mg | Freq: Two times a day (BID) | INTRAMUSCULAR | Status: DC

## 2018-07-27 MED ORDER — LORAZEPAM 1 MG PO TABS: 0.0000 mg | ORAL_TABLET | Freq: Two times a day (BID) | ORAL | Status: DC

## 2018-07-27 MED ORDER — THIAMINE HCL 100 MG/ML IJ SOLN: 100.0000 mg | Freq: Every day | INTRAMUSCULAR | Status: DC

## 2018-07-27 MED ORDER — LORAZEPAM 1 MG PO TABS: 0.0000 mg | ORAL_TABLET | Freq: Four times a day (QID) | ORAL | Status: DC

## 2018-07-27 MED ORDER — VITAMIN B-1 100 MG PO TABS
100.0000 mg | ORAL_TABLET | Freq: Every day | ORAL | Status: DC
  Administered 2018-07-28: 100 mg via ORAL
  Filled 2018-07-27: qty 1

## 2018-07-27 NOTE — ED Notes (Signed)
Bed: [facility] Expected date:  Expected time:  Means of arrival:  Comments: 

## 2018-07-27 NOTE — ED Provider Notes (Signed)
[facility]-EMERGENCY DEPT Provider Note   CSN: [id number] Arrival date & time: 07/27/18  2130     History   Chief Complaint Chief Complaint  Patient presents with  . Suicidal  . Weakness    HPI Marc Hart is a 27 y.o. male.  27 yo M with a chief complaints of suicidal ideation.  Going on for the past week or so.  Denies new change in his life to make him feel this way, denies change in medications or noncompliance.  Try to harm himself in the past by cutting himself.  No current plan for this one.  Denies medical complaint except for that he fell earlier and struck his jaw.  Planing of pain to the right side of his jaw.  Does not feel that his teeth fit together normally.  Denies difficulty with speech or swallowing.  Denies vomiting.  Denies abdominal pain chest pain shortness of breath.  The history is provided by the patient.  Weakness  This is a new problem. The current episode started more than 1 week ago. The problem has not changed since onset.There has been no fever. Pertinent negatives include no shortness of breath, no chest pain, no vomiting, no altered mental status, no confusion and no headaches.  Illness  This is a new problem. The current episode started 2 days ago. The problem occurs constantly. The problem has not changed since onset.Pertinent negatives include no chest pain, no abdominal pain, no headaches and no shortness of breath. Nothing aggravates the symptoms. Nothing relieves the symptoms. He has tried nothing for the symptoms. The treatment provided no relief.    Past Medical History:  Diagnosis Date  . Schizophrenia (HCC)   . Substance abuse [facility])     Patient Active Problem List   Diagnosis Date Noted  . Suicidal thoughts   . Schizophrenia (HCC) 06/26/2018  . Depression   . Polysubstance abuse (HCC)   . Substance induced mood disorder (HCC) 09/25/2015  . Alcohol dependence with uncomplicated withdrawal (HCC) 09/16/2015    . 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 Depressive disorder 09/12/2015  . History of schizophrenia   . Schizoaffective disorder (HCC) 12/18/2014    Past Surgical History:  Procedure Laterality Date  . OTHER SURGICAL HISTORY     Ear Surgery         Home Medications    Prior to Admission medications   Medication Sig Start Date End Date Taking? Authorizing Provider  paliperidone (INVEGA SUSTENNA) 156 MG/ML SUSY injection Inject 1 mL (156 mg total) into the muscle every 28 (twenty-eight) days. Last administration 07/02/18. 07/02/18  Yes [place], Burlene Arnt, MD  clotrimazole (LOTRIMIN) 1 % cream Apply topically 2 (two) times daily. Patient not taking: Reported on 07/27/2018 07/02/18   Micheal Likens, MD  doxycycline (VIBRA-TABS) 100 MG tablet Take 1 tablet (100 mg total) by mouth every 12 (twelve) hours. Patient not taking: Reported on 07/27/2018 07/02/18   Micheal Likens, MD    Family History No family history on file.  Social History Social History   Tobacco Use  . Smoking status: Current Some Day Smoker  . Smokeless tobacco: Never Used  Substance Use Topics  . Alcohol use: Yes    Comment: 6-9 tallboy beers/day  . Drug use: Yes    Types: Marijuana, IV, Cocaine, Methamphetamines    Comment: heroin 1gram/day     Allergies   Amoxicillin; Ibuprofen; Latex; Other; Peanut-containing drug products; Tylenol [acetaminophen]; Other; and Tylenol [acetaminophen]  Review of Systems Review of Systems  Constitutional: Negative for chills and fever.  HENT: Positive for dental problem. Negative for congestion and facial swelling.   Eyes: Negative for discharge and visual disturbance.  Respiratory: Negative for shortness of breath.   Cardiovascular: Negative for chest pain and palpitations.  Gastrointestinal: Negative for abdominal pain, diarrhea and vomiting.  Musculoskeletal: Negative for arthralgias and myalgias.  Skin: Negative for color  change and rash.  Neurological: Positive for weakness. Negative for tremors, syncope and headaches.  Psychiatric/Behavioral: Negative for confusion and dysphoric mood.     Physical Exam Updated Vital Signs BP (!) 105/48   Pulse 86   Temp (!) 97.5 F (36.4 C) (Oral)   Resp 16   SpO2 97%   Physical Exam  Constitutional: He is oriented to person, place, and time. He appears well-developed and well-nourished.  HENT:  Head: Normocephalic and atraumatic.  No signs of trauma on exam, intraoral exam is normal, TMs are normal bilaterally.  Eyes: Pupils are equal, round, and reactive to light. EOM are normal.  Neck: Normal range of motion. Neck supple. No JVD present.  Cardiovascular: Normal rate and regular rhythm. Exam reveals no gallop and no friction rub.  No murmur heard. Pulmonary/Chest: No respiratory distress. He has no wheezes.  Abdominal: He exhibits no distension. There is no rebound and no guarding.  Musculoskeletal: Normal range of motion.  Neurological: He is alert and oriented to person, place, and time.  Skin: No rash noted. No pallor.  Psychiatric: He has a normal mood and affect. His behavior is normal.  Nursing note and vitals reviewed.    ED Treatments / Results  Labs (all labs ordered are listed, but only abnormal results are displayed) Labs Reviewed  ETHANOL - Abnormal; Notable for the following components:      Result Value   Alcohol, Ethyl (B) 213 (*)    All other components within normal limits  ACETAMINOPHEN LEVEL - Abnormal; Notable for the following components:   Acetaminophen (Tylenol), Serum <10 (*)    All other components within normal limits  RAPID URINE DRUG SCREEN, HOSP PERFORMED - Abnormal; Notable for the following components:   Tetrahydrocannabinol POSITIVE (*)    All other components within normal limits  SALICYLATE LEVEL  CBC  COMPREHENSIVE METABOLIC PANEL    EKG None  Radiology Ct Head Wo Contrast  Result Date:  07/27/2018 CLINICAL DATA:  Fall striking jaw on the ground. EXAM: CT HEAD WITHOUT CONTRAST CT MAXILLOFACIAL WITHOUT CONTRAST TECHNIQUE: Multidetector CT imaging of the head and maxillofacial structures were performed using the standard protocol without intravenous contrast. Multiplanar CT image reconstructions of the maxillofacial structures were also generated. COMPARISON:  06/25/2018 and 11/19/2007 FINDINGS: CT HEAD FINDINGS Brain: The brainstem, cerebellum, cerebral peduncles, thalami, basal ganglia, basilar cisterns, and ventricular system appear within normal limits. No intracranial hemorrhage, mass lesion, or acute CVA. Vascular: Unremarkable Skull: Unremarkable Other: No supplemental non-categorized findings. CT MAXILLOFACIAL FINDINGS Osseous: No facial fracture identified. Orbits: Unremarkable Sinuses: Chronic bilateral maxillary sinusitis. Soft tissues: Equivocal soft tissue swelling along the chin anterior to the lower mandible. IMPRESSION: 1. No acute intracranial findings or facial fracture. Equivocal soft tissue swelling along the chin. 2. Chronic bilateral maxillary sinusitis. Electronically Signed   By: Gaylyn RongWalter  Liebkemann M.D.   On: 07/27/2018 23:21   Ct Maxillofacial Wo Contrast  Result Date: 07/27/2018 CLINICAL DATA:  Fall striking jaw on the ground. EXAM: CT HEAD WITHOUT CONTRAST CT MAXILLOFACIAL WITHOUT CONTRAST TECHNIQUE: Multidetector CT imaging of the head  and maxillofacial structures were performed using the standard protocol without intravenous contrast. Multiplanar CT image reconstructions of the maxillofacial structures were also generated. COMPARISON:  06/25/2018 and 11/19/2007 FINDINGS: CT HEAD FINDINGS Brain: The brainstem, cerebellum, cerebral peduncles, thalami, basal ganglia, basilar cisterns, and ventricular system appear within normal limits. No intracranial hemorrhage, mass lesion, or acute CVA. Vascular: Unremarkable Skull: Unremarkable Other: No supplemental non-categorized  findings. CT MAXILLOFACIAL FINDINGS Osseous: No facial fracture identified. Orbits: Unremarkable Sinuses: Chronic bilateral maxillary sinusitis. Soft tissues: Equivocal soft tissue swelling along the chin anterior to the lower mandible. IMPRESSION: 1. No acute intracranial findings or facial fracture. Equivocal soft tissue swelling along the chin. 2. Chronic bilateral maxillary sinusitis. Electronically Signed   By: Gaylyn Rong M.D.   On: 07/27/2018 23:21    Procedures Procedures (including critical care time)  Medications Ordered in ED Medications  LORazepam (ATIVAN) injection 0-4 mg (has no administration in time range)    Or  LORazepam (ATIVAN) tablet 0-4 mg (has no administration in time range)  LORazepam (ATIVAN) injection 0-4 mg (has no administration in time range)    Or  LORazepam (ATIVAN) tablet 0-4 mg (has no administration in time range)  thiamine (VITAMIN B-1) tablet 100 mg (has no administration in time range)    Or  thiamine (B-1) injection 100 mg (has no administration in time range)     Initial Impression / Assessment and Plan / ED Course  I have reviewed the triage vital signs and the nursing notes.  Pertinent labs & imaging results that were available during my care of the patient were reviewed by me and considered in my medical decision making (see chart for details).     27 yo M with a chief complaint of suicidal ideation.  Patient was here a few days ago, did not want to stay and left.  His mom had called and said that he has a history of schizoaffective disorder and was concerned for his well-being.  TTS evaluation.  TTS will reassess in AM.    CT face and head negative, labwork unremarkable.  Medically clear.   The patients results and plan were reviewed and discussed.   Any x-rays performed were independently reviewed by myself.   Differential diagnosis were considered with the presenting HPI.  Medications  LORazepam (ATIVAN) injection 0-4 mg (has  no administration in time range)    Or  LORazepam (ATIVAN) tablet 0-4 mg (has no administration in time range)  LORazepam (ATIVAN) injection 0-4 mg (has no administration in time range)    Or  LORazepam (ATIVAN) tablet 0-4 mg (has no administration in time range)  thiamine (VITAMIN B-1) tablet 100 mg (has no administration in time range)    Or  thiamine (B-1) injection 100 mg (has no administration in time range)    Vitals:   07/27/18 2143  BP: (!) 105/48  Pulse: 86  Resp: 16  Temp: (!) 97.5 F (36.4 C)  TempSrc: Oral  SpO2: 97%    Final diagnoses:  Suicidal ideation  Alcohol abuse    Admission/ observation were discussed with the admitting physician, patient and/or family and they are comfortable with the plan.    Final Clinical Impressions(s) / ED Diagnoses   Final diagnoses:  Suicidal ideation  Alcohol abuse    ED Discharge Orders    None       Melene Plan, DO 07/27/18 2332

## 2018-07-27 NOTE — BH Assessment (Addendum)
Assessment Note  Marc MartinezLandon D Santoyo is an 27 y.o. male who presents to the ED voluntarily. Pt reports he has been increasingly suicidal over the past several weeks but denies having a specific plan to this writer however he reported in triage that he wants to "take a lot of pills.". Pt states he has attempted suicide in the past. Pt states he is homeless and sleeps outside. Pt states he does not have any support or resources. Pt states he has been experiencing AVH that "just have conversations in my head." Pt was reportedly unconscious when he was found by EMS. Pt states he does not recall why he was unconscious and denies taking any substance. UDS are currently pending. Pt does have a hx of heroin use but denies use in the past month. Pt endorses daily alcohol use. Current BAL 213. Pt states he feels hopeless about life and the way things have been going which is what prompted his SI. Pt states he is [facility] and he last saw his provider about 2 weeks ago. Pt has 13 ED visits in the past 6 months. Pt was previously assessed by TTS on 07/02/18 c/o similar concerns.   Donell SievertSpencer Simon, PA recommends continued observation for safety and stabilization and to be reassessed in the AM by psych due to hx of suicide attempts and risk factors for suicide. EDP Dr. Adela LankFloyd, MD and triage nurse have been advised.  Diagnosis: Schizophrenia; Alcohol abuse disorder, severe  Past Medical History:  Past Medical History:  Diagnosis Date  . Schizophrenia (HCC)   . Substance abuse [facility](HCC)     Past Surgical History:  Procedure Laterality Date  . OTHER SURGICAL HISTORY     Ear Surgery     Family History: No family history on file.  Social History:  reports that he has been smoking. He has never used smokeless tobacco. He reports that he drinks alcohol. He reports that he has current or past drug history. Drugs: Marijuana, IV, Cocaine, and Methamphetamines.  Additional Social History:   Alcohol / Drug Use Pain Medications: See MAR Prescriptions: See MAR Over the Counter: See MAR History of alcohol / drug use?: Yes Longest period of sobriety (when/how long): none reported Substance #1 Name of Substance 1: Alcohol 1 - Age of First Use: 15 1 - Amount (size/oz): 2-3 40oz beers 1 - Frequency: daily 1 - Duration: ongoing 1 - Last Use / Amount: 07/27/18 Substance #2 Name of Substance 2: Heroin (IV) 2 - Age of First Use: 17 2 - Amount (size/oz): varies 2 - Frequency: weekly 2 - Duration: ongoing 2 - Last Use / Amount: October 2019  CIWA: CIWA-Ar BP: (!) 105/48 Pulse Rate: 86 COWS:    Allergies:  Allergies  Allergen Reactions  . Amoxicillin Anaphylaxis    Has patient had a PCN reaction causing immediate rash, facial/tongue/throat swelling, SOB or lightheadedness with hypotension: Yes Has patient had a PCN reaction causing severe rash involving mucus membranes or skin necrosis: No Has patient had a PCN reaction that required hospitalization No Has patient had a PCN reaction occurring within the last 10 years: Yes If all of the above answers are "NO", then may proceed with Cephalosporin use.  . Ibuprofen Other (See Comments)    Dry mouth (interaction with other meds?)  . Latex Other (See Comments)    Possible allergy per pt - unknown reaction  . Other Other (See Comments)    coppertone suntan lotion caused rash  . Peanut-Containing  Drug Products Other (See Comments)    Upset stomach  . Tylenol [Acetaminophen] Itching    Knees itched  . Other Rash    Allergic reaction to coppertone suntan lotion  . Tylenol [Acetaminophen] Itching    "It makes my knees itchy"     Home Medications:  (Not in a hospital admission)  OB/GYN Status:  No LMP for male patient.  General Assessment Data Location of Assessment: [facility] TTS Assessment: In system Is this a Tele or Face-to-Face Assessment?: Face-to-Face Is this an Initial Assessment or a Re-assessment for this  encounter?: Initial Assessment Patient Accompanied by:: (alone) Language Other than English: No Living Arrangements: Homeless/Shelter What gender do you identify as?: Male Marital status: Single Pregnancy Status: No Living Arrangements: Alone Can pt return to current living arrangement?: Yes Admission Status: Voluntary Is patient capable of signing voluntary admission?: Yes Referral Source: Self/Family/Friend Insurance type: none     Crisis Care Plan Living Arrangements: Alone Name of Psychiatrist: Transport [facility] Name of Therapist: [facility]  Education Status Is patient currently in school?: No Is the patient employed, unemployed or receiving disability?: Unemployed  Risk to self with the past 6 months Suicidal Ideation: Yes-Currently Present Has patient been a risk to self within the past 6 months prior to admission? : Yes Suicidal Intent: No Has patient had any suicidal intent within the past 6 months prior to admission? : No Is patient at risk for suicide?: Yes Suicidal Plan?: Yes-Currently Present Has patient had any suicidal plan within the past 6 months prior to admission? : Yes Specify Current Suicidal Plan: reported to triage nurse he wants to "take a lot of pills" Access to Means: Yes Specify Access to Suicidal Means: pt has access to pills  What has been your use of drugs/alcohol within the last 12 months?: alcohol, heroin  Previous Attempts/Gestures: Yes How many times?: 2 Other Self Harm Risks: hx of suicide attempts, substance abuse, homeless, lack of resources Triggers for Past Attempts: Unpredictable Intentional Self Injurious Behavior: None Family Suicide History: No Recent stressful life event(s): Financial Problems Persecutory voices/beliefs?: Yes Depression: Yes Depression Symptoms: Loss of interest in usual pleasures, Feeling worthless/self pity, Feeling angry/irritable Substance abuse history and/or treatment for substance abuse?: Yes Suicide prevention  information given to non-admitted patients: Not applicable  Risk to Others within the past 6 months Homicidal Ideation: No Does patient have any lifetime risk of violence toward others beyond the six months prior to admission? : No Thoughts of Harm to Others: No Current Homicidal Intent: No Current Homicidal Plan: No Access to Homicidal Means: No History of harm to others?: No Assessment of Violence: None Noted Does patient have access to weapons?: No Criminal Charges Pending?: No Does patient have a court date: No Is patient on probation?: No  Psychosis Hallucinations: Auditory Delusions: None noted  Mental Status Report Appearance/Hygiene: Poor hygiene, Disheveled Eye Contact: Good Motor Activity: Freedom of movement Speech: Soft, Slow Level of Consciousness: Quiet/awake Mood: Helpless Affect: Flat, Blunted Anxiety Level: None Thought Processes: Coherent, Relevant Judgement: Partial Orientation: Person, Place, Time, Situation, Appropriate for developmental age Obsessive Compulsive Thoughts/Behaviors: None  Cognitive Functioning Concentration: Normal Memory: Remote Intact, Recent Impaired Is patient IDD: No Insight: Fair Impulse Control: Good Appetite: Good Have you had any weight changes? : No Change Sleep: No Change Total Hours of Sleep: 8 Vegetative Symptoms: None  ADLScreening [facility] Assessment Services) Patient's cognitive ability adequate to safely complete daily activities?: Yes Patient able to express need for assistance with ADLs?: Yes Independently performs  ADLs?: Yes (appropriate for developmental age)  Prior Inpatient Therapy Prior Inpatient Therapy: Yes Prior Therapy Dates: mult admissions Prior Therapy Facilty/Provider(s): [facility] and other facilities Reason for Treatment: SI, SCHIZOPHRENIA  Prior Outpatient Therapy Prior Outpatient Therapy: Yes Prior Therapy Dates: CURRENT Prior Therapy Facilty/Provider(s): [facility] Reason for Treatment: MED  MANAGEMENT  Does patient have an ACCT team?: No Does patient have Intensive In-House Services?  : No Does patient have [facility] services? : Yes Does patient have P4CC services?: No  ADL Screening (condition at time of admission) Patient's cognitive ability adequate to safely complete daily activities?: Yes Is the patient deaf or have difficulty hearing?: No Does the patient have difficulty seeing, even when wearing glasses/contacts?: No Does the patient have difficulty concentrating, remembering, or making decisions?: No Patient able to express need for assistance with ADLs?: Yes Does the patient have difficulty dressing or bathing?: No Independently performs ADLs?: Yes (appropriate for developmental age) Does the patient have difficulty walking or climbing stairs?: No Weakness of Legs: None Weakness of Arms/Hands: None  Home Assistive Devices/Equipment Home Assistive Devices/Equipment: None    Abuse/Neglect Assessment (Assessment to be complete while patient is alone) Abuse/Neglect Assessment Can Be Completed: Yes Physical Abuse: Denies Verbal Abuse: Denies Sexual Abuse: Denies Exploitation of patient/patient's resources: Denies Self-Neglect: Denies     Merchant navy officer (For Healthcare) Does Patient Have a Medical Advance Directive?: No Would patient like information on creating a medical advance directive?: No - Patient declined          Disposition:  Disposition Initial Assessment Completed for this Encounter: Yes Disposition of Patient: (overnight OBS pending psych reassessment) Patient refused recommended treatment: No  On Site Evaluation by:   Reviewed with Physician:    Karolee Ohs 07/27/2018 10:50 PM

## 2018-07-27 NOTE — ED Notes (Signed)
Patient given water

## 2018-07-27 NOTE — Progress Notes (Signed)
Donell SievertSpencer Simon, PA recommends continued observation for safety and stabilization and to be reassessed in the AM by psych due to hx of suicide attempts and risk factors for suicide. EDP Dr. Adela LankFloyd, MD and triage nurse have been advised.

## 2018-07-27 NOTE — ED Triage Notes (Addendum)
Per EMS, patient from a home, found unconscious, alert upon EMS arrival. Reports generalized weakness x2 days and SI with plan to "take a lot of pills." Reports drinking three drinks today. States he remembers falling and hitting right jaw. No trauma noted.   Patient reports last heroin use x1 week ago. Denies HI.

## 2018-07-28 DIAGNOSIS — F10230 Alcohol dependence with withdrawal, uncomplicated: Secondary | ICD-10-CM

## 2018-07-28 DIAGNOSIS — F250 Schizoaffective disorder, bipolar type: Secondary | ICD-10-CM

## 2018-07-28 MED ORDER — PALIPERIDONE ER 6 MG PO TB24
6.0000 mg | ORAL_TABLET | Freq: Every day | ORAL | Status: DC
  Filled 2018-07-28: qty 1

## 2018-07-28 NOTE — ED Notes (Signed)
Pt to room #43, pt uninterested. Denies SI/HI/AVH. Encouragement and support provided. Special checks q 15 mins in place for safety, Video monitoring in place. Will continue to monitor.

## 2018-07-28 NOTE — ED Notes (Signed)
Pt informs this nurse that he does not want to wait to speak to peer support.

## 2018-07-28 NOTE — BH Assessment (Signed)
[facility] Assessment Progress Note  Per [name], MD, this pt does not require psychiatric hospitalization at this time.  [facility] information for area substance abuse treatment providers.  This has been included in pt's discharge instructions.  Pt would also benefit from seeing Peer Support Specialists; they will be asked to speak to pt.  Pt's nurse, Morrie Sheldonshley has been notified.  Doylene Canninghomas Aaleyah Witherow, MA Triage Specialist [phone]

## 2018-07-28 NOTE — ED Notes (Signed)
Pt d/c home per MD order. Discharge summary reviewed, Pt denies SI/HI/AVH. Pt signed e-signature. Personal property returned. Bus pass provided per pt request. Pt ambulatory off unit.

## 2018-07-28 NOTE — ED Notes (Signed)
Pt taking a shower 

## 2018-07-28 NOTE — Consult Note (Addendum)
[facility] Discharge  07/28/2018 8:43 AM [name]  MRN:  [id number] Principal Problem: Alcohol dependence with uncomplicated withdrawal [facility](HCC) Discharge Diagnoses: Principal Problem:   Alcohol dependence with uncomplicated withdrawal (HCC) Active Problems:   Schizoaffective disorder (HCC)  Subjective: 27 yo male who presented to the ED under the influence of alcohol with suicidal ideations.  This morning he denies any suicidal ideations or homicidal ideations, hallucinations, and withdrawal symptoms.  His [facility] due to stressors of being homeless, increased with stress.  Drinking decreases when he has housing and support.  Mild depression and anxiety noted with his homelessness issues.  Patient agreeable to meet with Peer Support to assist in rehab/recovery.  Stable for discharge.  Total Time spent with patient: 45 minutes  Past Psychiatric History: schizoaffective disorder, substance abuse  Past Medical History:  Past Medical History:  Diagnosis Date  . Schizophrenia (HCC)   . Substance abuse [facility](HCC)    Past Surgical History:  Procedure Laterality Date  . OTHER SURGICAL HISTORY     Ear Surgery    Family History: No family history on file. Family Psychiatric  History: none Social History:  Social History   Substance and Sexual Activity  Alcohol Use Yes   Comment: 6-9 tallboy beers/day    Social History   Substance and Sexual Activity  Drug Use Yes  . Types: Marijuana, IV, Cocaine, Methamphetamines   Comment: heroin 1gram/day   Social History   Socioeconomic History  . Marital status: Single    Spouse name: Not on file  . Number of children: Not on file  . Years of education: Not on file  . Highest education level: Not on file  Occupational History  . Not on file  Social Needs  . Financial resource strain: Not on file  . Food insecurity:    Worry: Not on file    Inability: Not on file  .  Transportation needs:    Medical: Not on file    Non-medical: Not on file  Tobacco Use  . Smoking status: Current Some Day Smoker  . Smokeless tobacco: Never Used  Substance and Sexual Activity  . Alcohol use: Yes    Comment: 6-9 tallboy beers/day  . Drug use: Yes    Types: Marijuana, IV, Cocaine, Methamphetamines    Comment: heroin 1gram/day  . Sexual activity: Not on file  Lifestyle  . Physical activity:    Days per week: Not on file    Minutes per session: Not on file  . Stress: Not on file  Relationships  . Social connections:    Talks on phone: Not on file    Gets together: Not on file    Attends religious service: Not on file    Active member of club or organization: Not on file    Attends meetings of clubs or organizations: Not on file    Relationship status: Not on file  Other Topics Concern  . Not on file  Social History Narrative   ** Merged History Encounter **        Has this patient used any form of tobacco in the last 30 days? (Cigarettes, Smokeless Tobacco, Cigars, and/or Pipes) A prescription for an FDA-approved tobacco cessation medication was offered at discharge and the patient refused  Current Medications: Current Facility-Administered Medications  Medication Dose Route Frequency Provider Last Rate Last Dose  . LORazepam (ATIVAN) injection 0-4 mg  0-4 mg Intravenous Q6H Melene PlanFloyd, Dan,  DO       Or  . LORazepam (ATIVAN) tablet 0-4 mg  0-4 mg Oral Q6H Melene Plan, DO      . [START ON 07/30/2018] LORazepam (ATIVAN) injection 0-4 mg  0-4 mg Intravenous Q12H Melene Plan, DO       Or  . Melene Muller ON 07/30/2018] LORazepam (ATIVAN) tablet 0-4 mg  0-4 mg Oral Q12H Melene Plan, DO      . paliperidone (INVEGA) 24 hr tablet 6 mg  6 mg Oral Daily Annisa Mazzarella Y, NP      . thiamine (VITAMIN B-1) tablet 100 mg  100 mg Oral Daily Melene Plan, DO       Or  . thiamine (B-1) injection 100 mg  100 mg Intravenous Daily Melene Plan, DO       Current Outpatient Medications   Medication Sig Dispense Refill  . paliperidone (INVEGA SUSTENNA) 156 MG/ML SUSY injection Inject 1 mL (156 mg total) into the muscle every 28 (twenty-eight) days. Last administration 07/02/18. 1.2 mL 0  . clotrimazole (LOTRIMIN) 1 % cream Apply topically 2 (two) times daily. (Patient not taking: Reported on 07/27/2018) 12 g 0  . doxycycline (VIBRA-TABS) 100 MG tablet Take 1 tablet (100 mg total) by mouth every 12 (twelve) hours. (Patient not taking: Reported on 07/27/2018) 5 tablet 0   PTA Medications:  (Not in a hospital admission)  Musculoskeletal: Strength & Muscle Tone: within normal limits Gait & Station: normal Patient leans: N/A  Psychiatric Specialty Exam: Physical Exam  Nursing note and vitals reviewed. Constitutional: He is oriented to person, place, and time. He appears well-developed and well-nourished.  HENT:  Head: Normocephalic.  Neck: Normal range of motion.  Respiratory: Effort normal.  Musculoskeletal: Normal range of motion.  Neurological: He is alert and oriented to person, place, and time.  Psychiatric: His speech is normal and behavior is normal. Judgment and thought content normal. His mood appears anxious. Cognition and memory are normal. He exhibits a depressed mood.    Review of Systems  Psychiatric/Behavioral: Positive for depression and substance abuse. The patient is nervous/anxious.   All other systems reviewed and are negative.   Blood pressure (!) 107/56, pulse 84, temperature (!) 97.5 F (36.4 C), temperature source Oral, resp. rate 16, SpO2 98 %.There is no height or weight on file to calculate BMI.  General Appearance: Casual  Eye Contact:  Good  Speech:  Normal Rate  Volume:  Normal  Mood:  Anxious and Depressed  Affect:  Congruent  Thought Process:  Coherent and Descriptions of Associations: Intact  Orientation:  Full (Time, Place, and Person)  Thought Content:  WDL and Logical  Suicidal Thoughts:  No  Homicidal Thoughts:  No  Memory:   Immediate;   Good Recent;   Good Remote;   Good  Judgement:  Fair  Insight:  Fair  Psychomotor Activity:  Normal  Concentration:  Concentration: Good and Attention Span: Good  Recall:  Good  Fund of Knowledge:  Good  Language:  Good  Akathisia:  No  Handed:  Right  AIMS (if indicated):     Assets:  Leisure Time Physical Health Resilience Social Support  ADL's:  Intact  Cognition:  WNL  Sleep:        Demographic Factors:  Male, Adolescent or young adult and Caucasian  Loss Factors: Financial problems/change in socioeconomic status  Historical Factors: NA  Risk Reduction Factors:   Sense of responsibility to family and Positive social support  Continued Clinical Symptoms:  Depression and anxiety, mild  Cognitive Features That Contribute To Risk:  None    Suicide Risk:  Minimal: No identifiable suicidal ideation.  Patients presenting with no risk factors but with morbid ruminations; may be classified as minimal risk based on the severity of the depressive symptoms   Plan Of Care/Follow-up recommendations:  Alcohol abuse with uncomplicated withdrawal: -Ativan alcohol detox protocol started -Peer support consult   Schizoaffective disorder: -Started Invega 6 mg daily  Activity:  as tolerated Diet:  heart healhty diet  Disposition: discharge with resources for rehab/recovery  Nanine Means, NP 07/28/2018, 8:43 AM

## 2018-07-28 NOTE — Discharge Instructions (Signed)
To help you maintain a sober lifestyle, a substance abuse treatment program may be beneficial to you.  Contact one of the following facilities at your earliest opportunity to ask about enrolling: ° °RESIDENTIAL PROGRAMS: ° °     [facility] °     [address] °     [place], [place] [address] °     [phone] ° °     [facility] °     [address] °     [place], [place] [address] °     [phone] ° °     [facility] °     [address] °     [place], [place] [address] °     [phone] ° °OUTPATIENT PROGRAMS: ° °     [facility] °     [address] °     [place],  [address] °     [phone] °     New patients are seen at the walk-in clinic every Tuesday from 9:00 am - 12:00 pm °

## 2018-07-29 NOTE — Telephone Encounter (Signed)
His information was sent to [facility] also so hopefully they will have luck reaching out to him.

## 2019-08-02 IMAGING — CT CT HEAD W/O CM
3 of 6 series · 15 of 47 positions shown, 18 images · non-contrast
Comparison: 06/25/2018 and 11/19/2007

CLINICAL DATA: Fall striking jaw on the ground.

EXAM:
CT HEAD WITHOUT CONTRAST
CT MAXILLOFACIAL WITHOUT CONTRAST
TECHNIQUE: Multidetector CT imaging of the head and maxillofacial structures
were performed using the standard protocol without intravenous
contrast. Multiplanar CT image reconstructions of the maxillofacial
structures were also generated.

[Series 8: max soft · axial · 0.33mm/px · z∈[+1339,+1499]mm · 10 of 94 slices shown, 13 images]
[im 9/94  brain]
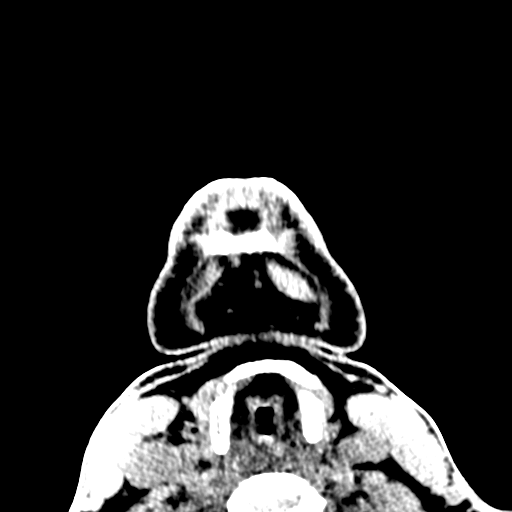
[im 9/94  bone]
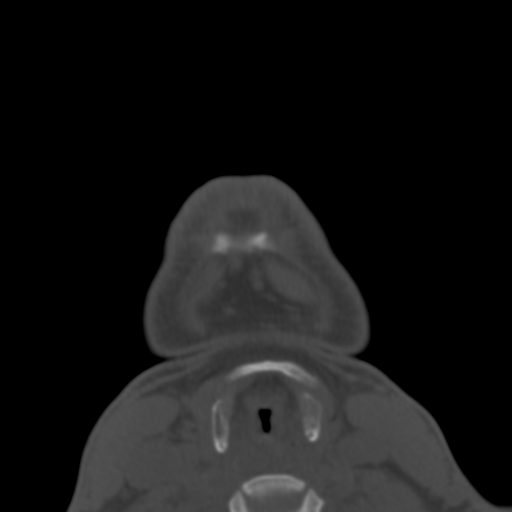
[im 18/94  brain]
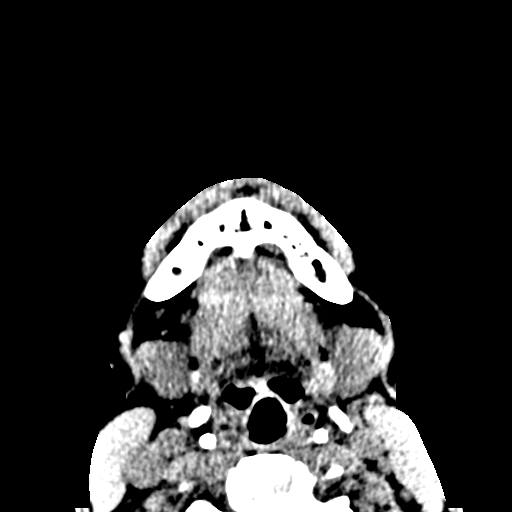
[im 27/94  brain]
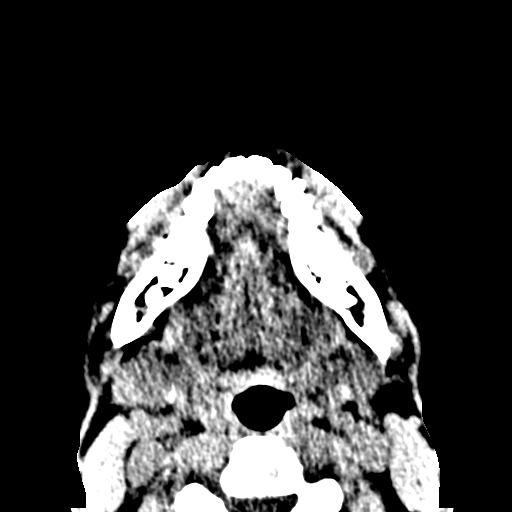
[im 36/94  brain]
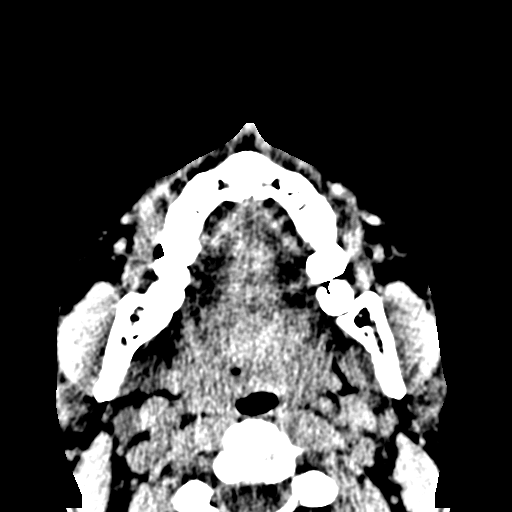
[im 45/94  brain]
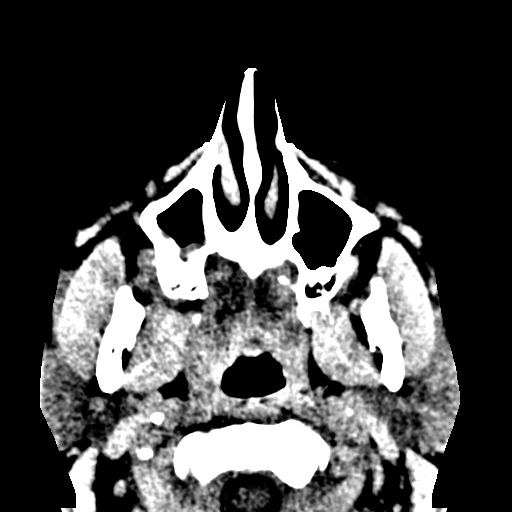
[im 45/94  bone]
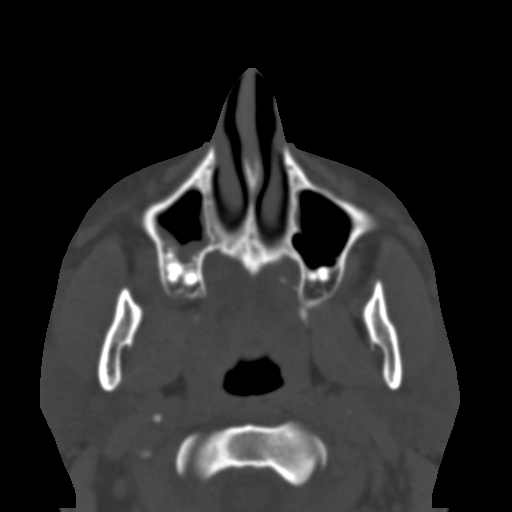
[im 54/94  brain]
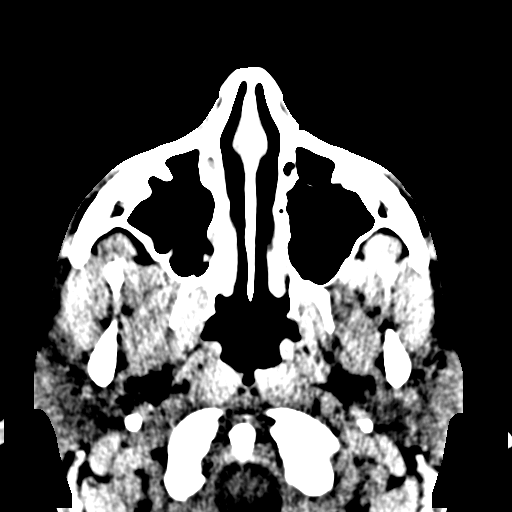
[im 63/94  brain]
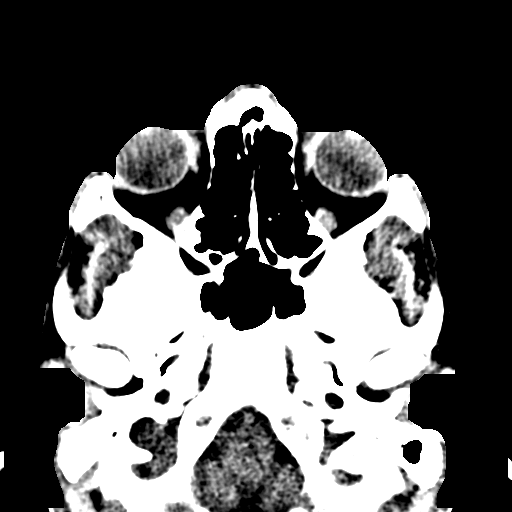
[im 71/94  brain]
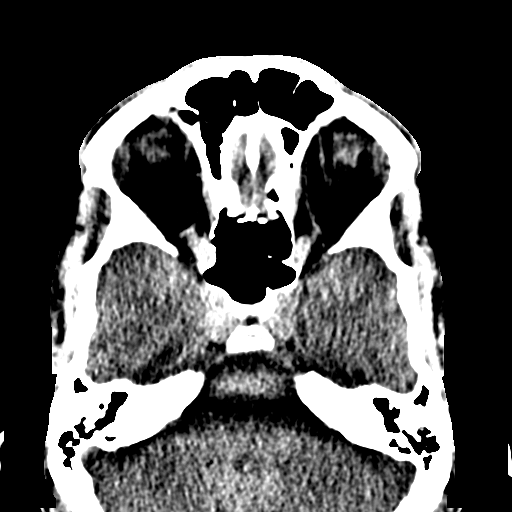
[im 80/94  brain]
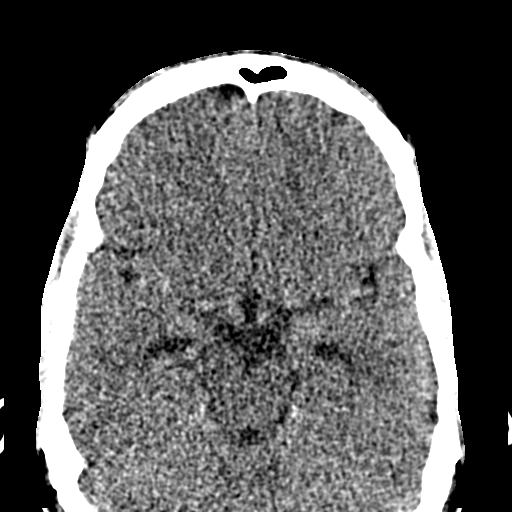
[im 80/94  bone]
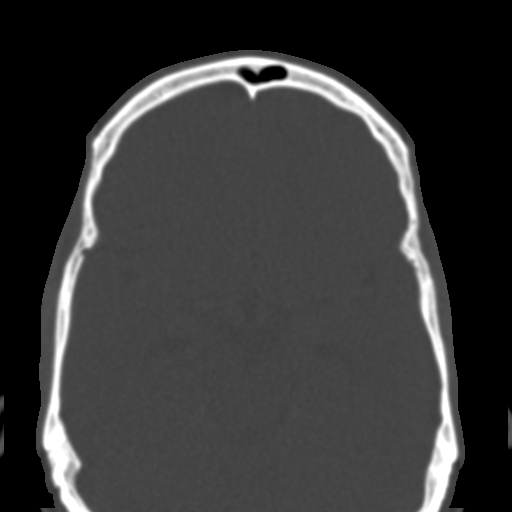
[im 89/94  brain]
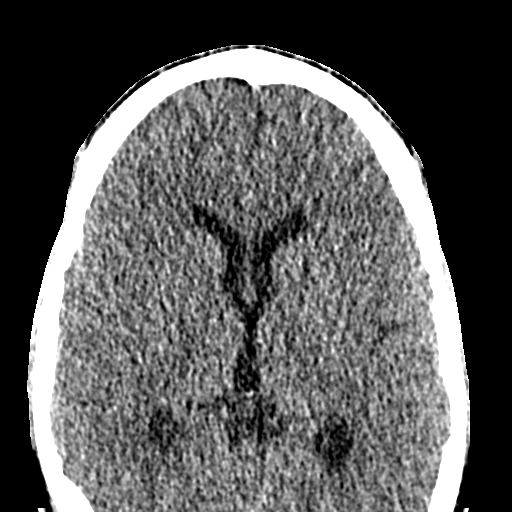

[Series 12: coronal soft · coronal · 0.34mm/px · 3 of 86 slices shown]
[im 18/86  brain]
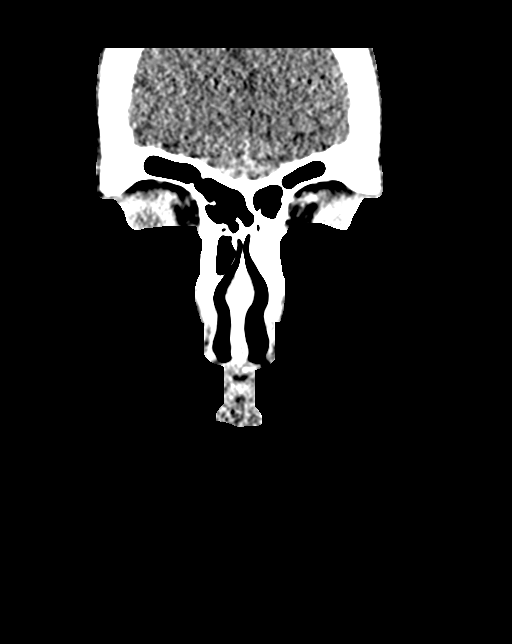
[im 35/86  brain]
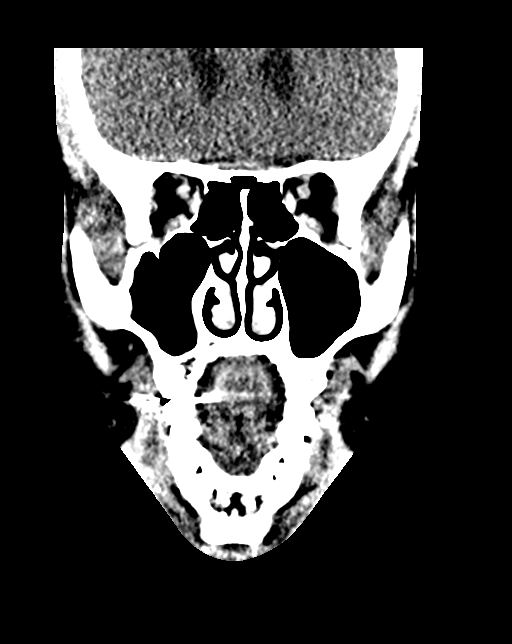
[im 52/86  brain]
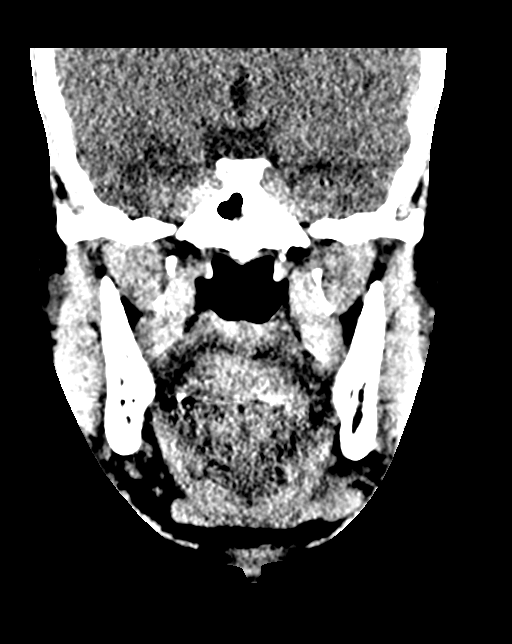

[Series 14: sagittal soft · sagittal · 0.35mm/px · 2 of 81 slices shown]
[im 27/81  brain]
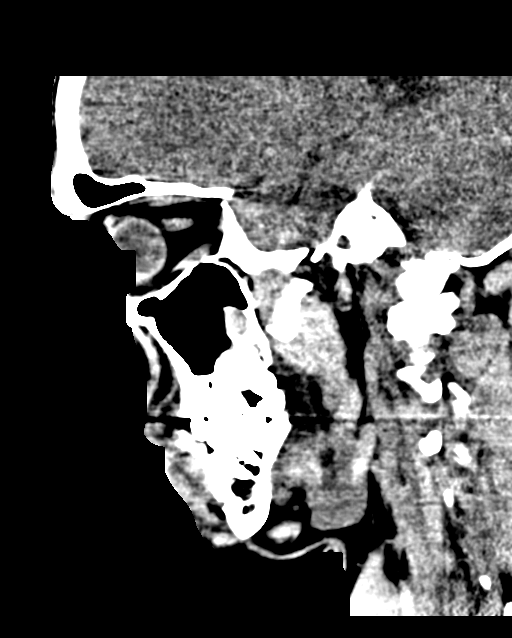
[im 54/81  brain]
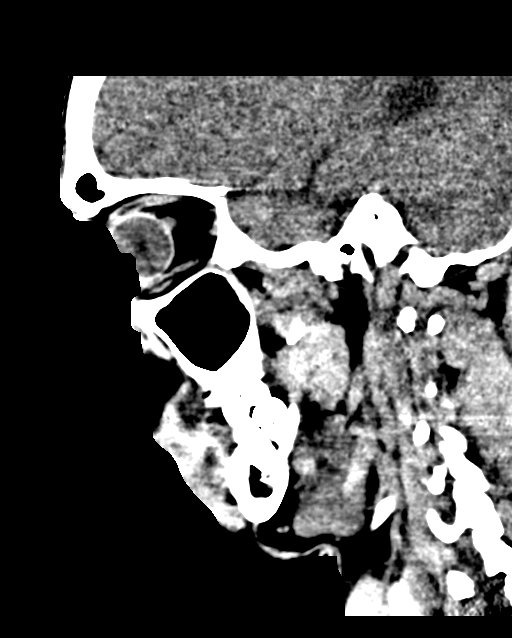

[15 of 47 positions shown; findings below may reference images not displayed]

FINDINGS: CT HEAD FINDINGS

Brain: The brainstem, cerebellum, cerebral peduncles, thalami, basal
ganglia, basilar cisterns, and ventricular system appear within
normal limits. No intracranial hemorrhage, mass lesion, or acute
CVA.

Vascular: Unremarkable

Skull: Unremarkable

Other: No supplemental non-categorized findings.

CT MAXILLOFACIAL FINDINGS

Osseous: No facial fracture identified.

Orbits: Unremarkable

Sinuses: Chronic bilateral maxillary sinusitis.

Soft tissues: Equivocal soft tissue swelling along the chin anterior
to the lower mandible.
IMPRESSION: 1. No acute intracranial findings or facial fracture. Equivocal soft
tissue swelling along the chin.
2. Chronic bilateral maxillary sinusitis.

## 2020-01-26 ENCOUNTER — Encounter (HOSPITAL_COMMUNITY): Payer: Self-pay

## 2020-02-02 ENCOUNTER — Ambulatory Visit (HOSPITAL_COMMUNITY): Payer: Self-pay | Admitting: Psychiatry

## 2020-02-02 ENCOUNTER — Other Ambulatory Visit: Payer: Self-pay

## 2020-02-02 ENCOUNTER — Ambulatory Visit (INDEPENDENT_AMBULATORY_CARE_PROVIDER_SITE_OTHER): Payer: No Payment, Other | Admitting: Psychiatry

## 2020-02-02 ENCOUNTER — Ambulatory Visit (INDEPENDENT_AMBULATORY_CARE_PROVIDER_SITE_OTHER): Payer: Self-pay | Admitting: *Deleted

## 2020-02-02 ENCOUNTER — Encounter (HOSPITAL_COMMUNITY): Payer: Self-pay | Admitting: Psychiatry

## 2020-02-02 VITALS — BP 152/86 | HR 82 | Ht 71.0 in | Wt 262.0 lb

## 2020-02-02 DIAGNOSIS — F411 Generalized anxiety disorder: Secondary | ICD-10-CM

## 2020-02-02 DIAGNOSIS — F209 Schizophrenia, unspecified: Secondary | ICD-10-CM

## 2020-02-02 MED ORDER — PALIPERIDONE PALMITATE ER 156 MG/ML IM SUSY
156.0000 mg | PREFILLED_SYRINGE | Freq: Once | INTRAMUSCULAR | Status: AC
  Administered 2020-02-02: 156 mg via INTRAMUSCULAR

## 2020-02-02 MED ORDER — ARIPIPRAZOLE 5 MG PO TABS: 5.0000 mg | ORAL_TABLET | Freq: Every day | ORAL | 1 refills | Status: DC

## 2020-02-02 MED ORDER — INVEGA SUSTENNA 156 MG/ML IM SUSY: 156.0000 mg | PREFILLED_SYRINGE | INTRAMUSCULAR | 6 refills | Status: DC

## 2020-02-02 MED ORDER — SERTRALINE HCL 25 MG PO TABS: 25.0000 mg | ORAL_TABLET | Freq: Every day | ORAL | 2 refills | Status: DC

## 2020-02-02 NOTE — Progress Notes (Signed)
Patient arrived today @ [facility] for office visit  & [name]  Sustenna 156 mg. Patient very pleasant.  Tolerated injection well in Right Arm. No complaints of  SI/HI OR VH/AH

## 2020-02-02 NOTE — Progress Notes (Signed)
Psychiatric Initial Adult Assessment    Patient Identification: Marc Hart MRN:  [id number] Date of Evaluation:  02/02/2020 Referral Source: Seward Meth, NP  Chief Complaint:  I'm feeling anxious  Chief Complaint    office vist     Visit Diagnosis:    ICD-10-CM   1. Schizophrenia, unspecified type ([place])  F20.9 paliperidone (INVEGA SUSTENNA) injection 156 mg    paliperidone (INVEGA SUSTENNA) 156 MG/ML SUSY injection  2. Generalized anxiety disorder  F41.1 sertraline (ZOLOFT) 25 MG tablet    History of Present Illness:  29 year old male seen today for initial psychiatric evaluation. Patient referred to outpatient psychiatry by [facility] for medication management. Today he reports that he has been feeling anxious. He reports that when anxious he has a break down and feels as if he's under pressure. He note increased heart rate, excessive worry, and sweating when anxious. Patient request medications to help with symptoms of anxiety. Provider informed patient that she should prescribe Zoloft 25 mg daily. He endorsed understanding.   During exam patient endorses auditory hallucinations. He notes that her hears people talking that he knows are not there. Patient reports that he is on Abilify and is agreeable to continue the medications. He denies visual hallucination. He denies SI/HI/. Patient reports that he has been receiving Kirt Boys from [place] to manage his schizophrenia. His last injection was last month. Patient received injection today.    Patient informed Probation officer that he has been sober for a year and a half. He notes that he went to a [place] treatment facility in [place] for two months. 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 Patient reports that he is trying to get his licence back. He notes that prior to his sobriety he used crack, PCP, and heroin which caused him to  have several legal consequences including a DUI. No other concerns noted at this time.     Associated Signs/Symptoms: Depression Symptoms:  Patient reports feeling tired. (Hypo) Manic Symptoms:  Denies Anxiety Symptoms:  Excessive Worry, Psychotic Symptoms:  Hallucinations: Auditory PTSD Symptoms: NA  Past Psychiatric History: Depression, Schizophrenia, Alcohol dependence, substance induced mood disorder, schizoaffective disorder, ADHD   Previous Psychotropic Medications: Yes   Substance Abuse History in the last 12 months:  No.  Consequences of Substance Abuse: Legal Consequences:  Patient reports that he has been clean for a year and a half however notes he has DUI fines from past substance abuse.   Past Medical History:  Past Medical History:  Diagnosis Date  . Schizophrenia ([place])   . Substance abuse [facility])     Past Surgical History:  Procedure Laterality Date  . OTHER SURGICAL HISTORY     Ear Surgery     Family Psychiatric History: Father Bipolar, Sister anxiety,   Family History: No family history on file. Paternal and maternal grandmother dementia,   Social History:   Social History   Socioeconomic History  . Marital status: Single    Spouse name: Not on file  . Number of children: Not on file  . Years of education: Not on file  . Highest education level: Not on file  Occupational History  . Not on file  Tobacco Use  . Smoking status: Current Some Day Smoker  . Smokeless tobacco: Never Used  Substance and Sexual Activity  . Alcohol use: Yes    Comment: 6-9 tallboy beers/day  .  Drug use: Yes    Types: Marijuana, IV, Cocaine, Methamphetamines    Comment: heroin 1gram/day  . Sexual activity: Not on file  Other Topics Concern  . Not on file  Social History Narrative   ** Merged History Encounter **       Social Determinants of Health   Financial Resource Strain:   . Difficulty of Paying Living Expenses:   Food Insecurity:   . Worried About Community education officer in the Last Year:   . Barista in the Last Year:   Transportation Needs:   . Freight forwarder (Medical):   Marland Kitchen Lack of Transportation (Non-Medical):   Physical Activity:   . Days of Exercise per Week:   . Minutes of Exercise per Session:   Stress:   . Feeling of Stress :   Social Connections:   . Frequency of Communication with Friends and Family:   . Frequency of Social Gatherings with Friends and Family:   . Attends Religious Services:   . Active Member of Clubs or Organizations:   . Attends Banker Meetings:   Marland Kitchen Marital Status:     Additional Social History: Patient reports that he currently resides with his mother. He reports that he was homeless for five years due to substance abuse. Currently he has been sober for a year and a half. 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 He has one 45 year old son.   Allergies:   Allergies  Allergen Reactions  . Amoxicillin Anaphylaxis    Has patient had a PCN reaction causing immediate rash, facial/tongue/throat swelling, SOB or lightheadedness with hypotension: Yes Has patient had a PCN reaction causing severe rash involving mucus membranes or skin necrosis: No Has patient had a PCN reaction that required hospitalization No Has patient had a PCN reaction occurring within the last 10 years: Yes If all of the above answers are "NO", then may proceed with Cephalosporin use.  . Other Other (See Comments)    coppertone suntan lotion caused rash  . Peanut-Containing Drug Products Other (See Comments)    Upset stomach  . Tylenol [Acetaminophen] Itching    Knees itched  . Other Rash    Allergic reaction to coppertone suntan lotion  . Tylenol [Acetaminophen] Itching    "It makes my knees itchy"     Metabolic Disorder Labs: No results found for: HGBA1C, MPG No results found for: PROLACTIN No results found for: CHOL, TRIG, HDL, CHOLHDL, VLDL, LDLCALC No results found for:  TSH  Therapeutic Level Labs: No results found for: LITHIUM No results found for: CBMZ No results found for: VALPROATE  Current Medications: Current Outpatient Medications  Medication Sig Dispense Refill  . paliperidone (INVEGA SUSTENNA) 156 MG/ML SUSY injection Inject 1 mL (156 mg total) into the muscle every 28 (twenty-eight) days. Last administration 07/02/18. 1.2 mL 0  . paliperidone (INVEGA SUSTENNA) 156 MG/ML SUSY injection Inject 1 mL (156 mg total) into the muscle every 28 (twenty-eight) days. 1 mL 6  . sertraline (ZOLOFT) 25 MG tablet Take 1 tablet (25 mg total) by mouth daily. 30 tablet 2   Current Facility-Administered Medications  Medication Dose Route Frequency Provider Last Rate Last Admin  . paliperidone (INVEGA SUSTENNA) injection 156 mg  156 mg Intramuscular Once Shanna Cisco, NP        Musculoskeletal: Strength & Muscle Tone: within normal limits Gait & Station: normal Patient leans: Right  Psychiatric Specialty Exam:  Review of Systems  Blood pressure (!) 146/78, pulse 75, height 5\' 11"  (1.803 m), weight 262 lb (118.8 kg), SpO2 98 %.Body mass index is 36.54 kg/m.  General Appearance: Well Groomed  Eye Contact:  Good  Speech:  Clear and Coherent  Volume:  Normal  Mood:  Euthymic  Affect:  Congruent  Thought Process:  Coherent and Linear  Orientation:  Full (Time, Place, and Person)  Thought Content:  WDL  Suicidal Thoughts:  No  Homicidal Thoughts:  No  Memory:  Immediate;   Good Recent;   Good Remote;   Good  Judgement:  Fair  Insight:  Fair  Psychomotor Activity:  Normal  Concentration:  Concentration: Good and Attention Span: Good  Recall:  Good  Fund of Knowledge:Good  Language: Good  Akathisia:  NA  Handed:  Right  AIMS (if indicated):  not done  Assets:  Communication Skills Desire for Improvement Financial Resources/Insurance Housing  ADL's:  Intact  Cognition: WNL  Sleep:  Fair   Screenings: AIMS     [facility] ([facility]  AIMS Total Score  0      Assessment and Plan: Patient reports that he is doing well overall. He reports that at times he is anxious and would like medications to alliviate his symptoms. Patient agreeable to start Zoloft 25 mg QD for anxiety. Patient agreeable to continue all othert medications as percribes.  1. Schizophrenia, unspecified type (HCC)  Order- paliperidone (INVEGA SUSTENNA) injection 156 mg Give in house injection of - paliperidone (INVEGA SUSTENNA) 156 MG/ML SUSY injection; Inject 1 mL (156 mg total) into the muscle every 28 (twenty-eight) days.  Dispense: 1 mL; Refill: 6 Continue- ARIPiprazole (ABILIFY) 5 MG tablet; Take 1 tablet (5 mg total) by mouth daily.  Dispense: 30 tablet; Refill: 1  2. Generalized anxiety disorder  Order- sertraline (ZOLOFT) 25 MG tablet; Take 1 tablet (25 mg total) by mouth daily.  Dispense: 30 tablet; Refill: 2  Follow up in one month      06/28/2018, NP 6/1/20219:06 AM

## 2020-03-03 ENCOUNTER — Ambulatory Visit (INDEPENDENT_AMBULATORY_CARE_PROVIDER_SITE_OTHER): Payer: No Payment, Other | Admitting: Psychiatry

## 2020-03-03 ENCOUNTER — Encounter (HOSPITAL_COMMUNITY): Payer: Self-pay | Admitting: Psychiatry

## 2020-03-03 ENCOUNTER — Ambulatory Visit (HOSPITAL_COMMUNITY): Payer: Self-pay | Admitting: Psychiatry

## 2020-03-03 ENCOUNTER — Other Ambulatory Visit: Payer: Self-pay

## 2020-03-03 DIAGNOSIS — F209 Schizophrenia, unspecified: Secondary | ICD-10-CM

## 2020-03-03 DIAGNOSIS — F411 Generalized anxiety disorder: Secondary | ICD-10-CM

## 2020-03-03 MED ORDER — ARIPIPRAZOLE(SENSOR) POD MAINT 10 MG PO TABS: 10.0000 mg | ORAL_TABLET | Freq: Every day | ORAL | 2 refills | Status: DC

## 2020-03-03 MED ORDER — SERTRALINE HCL 25 MG PO TABS: 25.0000 mg | ORAL_TABLET | Freq: Every day | ORAL | 2 refills | Status: DC

## 2020-03-03 MED ORDER — PALIPERIDONE PALMITATE ER 156 MG/ML IM SUSY
156.0000 mg | PREFILLED_SYRINGE | Freq: Once | INTRAMUSCULAR | Status: AC
  Administered 2020-03-03: 156 mg via INTRAMUSCULAR
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
## 2020-03-03 NOTE — Progress Notes (Signed)
BH MD/PA/NP OP Progress Note  03/03/2020 8:33 AM Marc Hart  MRN:  [id number]  Chief Complaint:  Chief Complaint    Injections     HPI: 29 year old male seen today for follow-up psychiatric evaluation.  Psychiatric history of depression, schizophrenia, alcohol dependence, substance-induced mood disorder, schizoaffective and ADHD.  Today he reports that he anxious and depressed. He informed Clinical research associate that he was unable to afford scribed Zoloft which was prescribed at last visit. He however notes that he should be able to afford it now and is agreeable to stating the medication  Patient endorses command auditory hallucinations today.  Reports that at times his voices tell him to kill himself.  He notes that at times he has passive suicidal ideation however he contracts for safety at this time and denies having a plan.  He informed Clinical research associate that he occasionally smokes marijuana ever notes that he is attempting to quit because he is looking for a job.  Writer informed patient that marijuana can increase the risk of psychosis, irritability, phoria, anxiety, and depression. He is understanding and that he would decrease his consumption.  He also informed Clinical research associate that he would like to follow-up with a therapist to help him cope with life stressors.  No other concerns noted at this time. Visit Diagnosis:    ICD-10-CM   1. Schizophrenia, unspecified type (HCC)  F20.9 ARIPiprazole 10 MG TABS    paliperidone (INVEGA SUSTENNA) injection 156 mg  2. Generalized anxiety disorder  F41.1 sertraline (ZOLOFT) 25 MG tablet    Past Psychiatric History: Depression, Schizophrenia, Alcohol dependence, substance induced mood disorder, schizoaffective disorder, ADHD    Past Medical History:  Past Medical History:  Diagnosis Date  . Schizophrenia (HCC)   . Substance abuse [facility])     Past Surgical History:  Procedure Laterality Date  . OTHER SURGICAL HISTORY     Ear Surgery     Family Psychiatric History:  Father Bipolar, Sister anxiety,   Family History: History reviewed. No pertinent family history.  Social History:  Social History   Socioeconomic History  . Marital status: Single    Spouse name: Not on file  . Number of children: Not on file  . Years of education: Not on file  . Highest education level: Not on file  Occupational History  . Not on file  Tobacco Use  . Smoking status: Current Some Day Smoker  . Smokeless tobacco: Never Used  Vaping Use  . Vaping Use: Never used  Substance and Sexual Activity  . Alcohol use: Yes    Comment: 6-9 tallboy beers/day  . Drug use: Yes    Types: Marijuana, IV, Cocaine, Methamphetamines    Comment: heroin 1gram/day  . Sexual activity: Not on file  Other Topics Concern  . Not on file  Social History Narrative   ** Merged History Encounter **       Social Determinants of Health   Financial Resource Strain:   . Difficulty of Paying Living Expenses:   Food Insecurity:   . Worried About Programme researcher, broadcasting/film/video in the Last Year:   . Barista in the Last Year:   Transportation Needs:   . Freight forwarder (Medical):   Marland Kitchen Lack of Transportation (Non-Medical):   Physical Activity:   . Days of Exercise per Week:   . Minutes of Exercise per Session:   Stress:   . Feeling of Stress :   Social Connections:   . Frequency of Communication with Friends  and Family:   . Frequency of Social Gatherings with Friends and Family:   . Attends Religious Services:   . Active Member of Clubs or Organizations:   . Attends Banker Meetings:   Marland Kitchen Marital Status:     Allergies:  Allergies  Allergen Reactions  . Amoxicillin Anaphylaxis    Has patient had a PCN reaction causing immediate rash, facial/tongue/throat swelling, SOB or lightheadedness with hypotension: Yes Has patient had a PCN reaction causing severe rash involving mucus membranes or skin necrosis: No Has patient had a PCN reaction that required hospitalization  No Has patient had a PCN reaction occurring within the last 10 years: Yes If all of the above answers are "NO", then may proceed with Cephalosporin use.  . Other Other (See Comments)    coppertone suntan lotion caused rash  . Peanut-Containing Drug Products Other (See Comments)    Upset stomach  . Tylenol [Acetaminophen] Itching    Knees itched  . Other Rash    Allergic reaction to coppertone suntan lotion  . Tylenol [Acetaminophen] Itching    "It makes my knees itchy"     Metabolic Disorder Labs: No results found for: HGBA1C, MPG No results found for: PROLACTIN No results found for: CHOL, TRIG, HDL, CHOLHDL, VLDL, LDLCALC No results found for: TSH  Therapeutic Level Labs: No results found for: LITHIUM No results found for: VALPROATE No components found for:  CBMZ  Current Medications: Current Outpatient Medications  Medication Sig Dispense Refill  . ARIPiprazole 10 MG TABS Take 10 mg by mouth daily. 30 tablet 2  . paliperidone (INVEGA SUSTENNA) 156 MG/ML SUSY injection Inject 1 mL (156 mg total) into the muscle every 28 (twenty-eight) days. Last administration 07/02/18. 1.2 mL 0  . sertraline (ZOLOFT) 25 MG tablet Take 1 tablet (25 mg total) by mouth daily. 30 tablet 2   Current Facility-Administered Medications  Medication Dose Route Frequency Provider Last Rate Last Admin  . paliperidone (INVEGA SUSTENNA) injection 156 mg  156 mg Intramuscular Once Toy Cookey E, NP         Musculoskeletal: Strength & Muscle Tone: within normal limits Gait & Station: normal Patient leans: N/A  Psychiatric Specialty Exam: Review of Systems  Blood pressure 123/76, pulse 63, temperature 98 F (36.7 C), weight 261 lb (118.4 kg), SpO2 99 %.Body mass index is 36.4 kg/m.  General Appearance: Well Groomed  Eye Contact:  Good  Speech:  Clear and Coherent and Normal Rate  Volume:  Normal  Mood:  Anxious  Affect:  Congruent  Thought Process:  Coherent, Goal Directed and Linear   Orientation:  Full (Time, Place, and Person)  Thought Content: Logical and Hallucinations: Auditory   Suicidal Thoughts:  Yes.  without intent/plan  Homicidal Thoughts:  No  Memory:  Immediate;   Good Recent;   Good Remote;   Good  Judgement:  Fair  Insight:  Fair  Psychomotor Activity:  Normal  Concentration:  Concentration: Good and Attention Span: Good  Recall:  Good  Fund of Knowledge: Good  Language: Good  Akathisia:  No  Handed:  Right  AIMS (if indicated): not done  Assets:  Communication Skills Desire for Improvement Housing  ADL's:  Intact  Cognition: WNL  Sleep:  Good   Screenings: AIMS     [facility]  AIMS Total Score 0       Assessment and Plan: She reports that he is still feeling anxious.  He  informed Clinical research associate that he could afford his Zoloft however notes that he believes he can afford it now and is agreeable to starting the medication.  He also endorses auditory hallucinations agreeable to continuing Invega as well as having Abilify 5 mg increased to 10 mg to help with symptoms of psychosis.  1. Schizophrenia, unspecified type (HCC)  Increased- ARIPiprazole 10 MG TABS; Take 10 mg by mouth daily.  Dispense: 30 tablet; Refill: 2 Continue- paliperidone (INVEGA SUSTENNA) injection 156 mg  2. Generalized anxiety disorder  Continue- sertraline (ZOLOFT) 25 MG tablet; Take 1 tablet (25 mg total) by mouth daily.  Dispense: 30 tablet; Refill: 2    Shanna Cisco, NP 03/03/2020, 8:33 AM

## 2020-03-22 ENCOUNTER — Ambulatory Visit (HOSPITAL_COMMUNITY): Payer: Self-pay | Admitting: Clinical

## 2020-04-04 ENCOUNTER — Other Ambulatory Visit (HOSPITAL_COMMUNITY): Payer: Self-pay | Admitting: Psychiatry

## 2020-04-04 ENCOUNTER — Telehealth (HOSPITAL_COMMUNITY): Payer: Self-pay | Admitting: *Deleted

## 2020-04-04 ENCOUNTER — Ambulatory Visit (HOSPITAL_COMMUNITY): Payer: No Payment, Other | Admitting: *Deleted

## 2020-04-04 ENCOUNTER — Encounter (HOSPITAL_COMMUNITY): Payer: Self-pay

## 2020-04-04 ENCOUNTER — Other Ambulatory Visit: Payer: Self-pay

## 2020-04-04 VITALS — BP 112/72 | HR 97 | Temp 97.6°F | Wt 256.0 lb

## 2020-04-04 DIAGNOSIS — F209 Schizophrenia, unspecified: Secondary | ICD-10-CM

## 2020-04-04 MED ORDER — PALIPERIDONE PALMITATE ER 156 MG/ML IM SUSY
156.0000 mg | PREFILLED_SYRINGE | Freq: Once | INTRAMUSCULAR | Status: AC
  Administered 2020-04-04: 156 mg via INTRAMUSCULAR

## 2020-04-04 NOTE — Progress Notes (Signed)
In as scheduled for his [place] injection. His mom brought him per his report but is waiting for him in the car. On arrival he said he is not doing very well. He states he is having "high highs and low lows". Is more agitated and negative then I have seen him on previous visits. He states he thought when he got clean off drugs everything would be ok but its not. He states his mom thinks this shot is all he needs and he should be alright but he is not. He states NP wrote him RX months ago and he never filled them, He is open to getting them now and taking them as he recognizes he is not at his baseline. Difficulty is he doesn't have a way to get to the pharmacy to pick them up and no money to buy them. He doesn't get disability or work. States he recently moved into his own place, states his mom didn't want him to tell us. States she is unwilling to go get his meds at [facility] where they would be affordable. He is currently living in [place]. Brittney NP came to speak with him since he is not stable and encouraged him to fill his meds. Writer will work on how he can do that, see if there is a pharmacy that delivers and what the cost will be and call him the pm. He is to return in one month. He received [name] Sustenna 156 mg this am in R gluteal muscle.

## 2020-04-04 NOTE — Telephone Encounter (Signed)
Called 3 pharmacies to give him options and check prices to see who could deliver it or mail it to him and the cost since he doesn't have an income. [facility], they have delivery but it takes 2-3 days and cost would be 14.00, [place] cost would be 15.00 it can go on his bill with him making payments on it and they can Fed Ex it overnight and [facility] it would be 17.00 and they have delivery if called in by 1p delivered that pm. Called him to discuss options, he chose [facility] and called Marcelino Duster at [facility] and she will overnight it to him for tomorrow. Discussed with him the meds, purpose of each and how to take it. He verbalized his understanding. He also is aware he has to make payments on his med bill at [place]. He expressed his appreciation for writers help.

## 2020-04-26 ENCOUNTER — Telehealth (HOSPITAL_COMMUNITY): Payer: Self-pay | Admitting: *Deleted

## 2020-04-26 NOTE — Telephone Encounter (Signed)
Called both yesterday and today. Yesterday reported he was feeling well and blessed. Asked Clinical research associate to provide a letter stating I think he needs disability that it would help his case which he says is an active case. Discussed speaking with Ms Doyne Keel NP re that when he comes for his scheduled shot next week. Today reporting he is taking his meds and staying clean but is having anxiety during the day and at bedtime. Writer tried calling him back but voice mail not set up and unable to leave a message.

## 2020-04-28 ENCOUNTER — Encounter (HOSPITAL_COMMUNITY): Payer: Self-pay | Admitting: Psychiatry

## 2020-05-02 ENCOUNTER — Ambulatory Visit (INDEPENDENT_AMBULATORY_CARE_PROVIDER_SITE_OTHER): Payer: No Payment, Other | Admitting: *Deleted

## 2020-05-02 ENCOUNTER — Other Ambulatory Visit: Payer: Self-pay

## 2020-05-02 ENCOUNTER — Encounter (HOSPITAL_COMMUNITY): Payer: Self-pay

## 2020-05-02 ENCOUNTER — Other Ambulatory Visit (HOSPITAL_COMMUNITY): Payer: Self-pay | Admitting: Psychiatry

## 2020-05-02 VITALS — BP 126/74 | HR 60 | Temp 98.2°F | Wt 257.0 lb

## 2020-05-02 DIAGNOSIS — F209 Schizophrenia, unspecified: Secondary | ICD-10-CM

## 2020-05-02 MED ORDER — PALIPERIDONE PALMITATE ER 156 MG/ML IM SUSY
156.0000 mg | PREFILLED_SYRINGE | Freq: Once | INTRAMUSCULAR | Status: AC
  Administered 2020-06-27: 156 mg via INTRAMUSCULAR

## 2020-05-02 MED ORDER — PALIPERIDONE PALMITATE ER 156 MG/ML IM SUSY
156.0000 mg | PREFILLED_SYRINGE | Freq: Once | INTRAMUSCULAR | Status: AC
  Administered 2020-05-02: 156 mg via INTRAMUSCULAR

## 2020-05-02 MED ORDER — PALIPERIDONE PALMITATE ER 156 MG/ML IM SUSY: 156.0000 mg | PREFILLED_SYRINGE | INTRAMUSCULAR | 10 refills | Status: DC

## 2020-05-02 NOTE — Addendum Note (Signed)
Addended by: Wynona Luna on: 05/02/2020 02:57 PM   Modules accepted: Orders
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
## 2020-05-12 ENCOUNTER — Encounter (HOSPITAL_COMMUNITY): Payer: Self-pay | Admitting: Psychiatry

## 2020-05-23 ENCOUNTER — Telehealth (HOSPITAL_COMMUNITY): Payer: Self-pay | Admitting: *Deleted

## 2020-05-23 NOTE — Telephone Encounter (Signed)
Call from patient stating he missed his appt with provider on the 9th and needs to reschedule, also out of his medications. He has refills but not able to pick them up from [place]. [facility] can mail them to him but after speaking with them he needs to pay on his bill first. Will inform him. Will also reschedule his provider appt. 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 Sounds good, upbeat.

## 2020-06-02 ENCOUNTER — Ambulatory Visit (HOSPITAL_COMMUNITY): Payer: No Payment, Other

## 2020-06-10 ENCOUNTER — Telehealth (HOSPITAL_COMMUNITY): Payer: Self-pay | Admitting: *Deleted

## 2020-06-10 NOTE — Telephone Encounter (Signed)
Patient missed his appt on 9/30, called him to reschedule him since I havent heard from him. He states he has started a new job, had orientation yesterday. 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 States he is working today and will call me mon to reschedule.

## 2020-06-21 ENCOUNTER — Ambulatory Visit (HOSPITAL_COMMUNITY): Payer: No Payment, Other

## 2020-06-22 ENCOUNTER — Telehealth (HOSPITAL_COMMUNITY): Payer: Self-pay | Admitting: *Deleted

## 2020-06-22 NOTE — Telephone Encounter (Signed)
No showed for his appt yesterday for his injection. Called him today to reschedule. He chose to come mon the 25th at 3 pm.

## 2020-06-27 ENCOUNTER — Ambulatory Visit (HOSPITAL_COMMUNITY): Payer: No Payment, Other | Admitting: *Deleted

## 2020-06-27 ENCOUNTER — Other Ambulatory Visit: Payer: Self-pay

## 2020-06-27 ENCOUNTER — Encounter (HOSPITAL_COMMUNITY): Payer: Self-pay

## 2020-06-27 VITALS — BP 118/78 | HR 68 | Ht 71.0 in | Wt 257.0 lb

## 2020-06-27 DIAGNOSIS — F209 Schizophrenia, unspecified: Secondary | ICD-10-CM

## 2020-06-27 NOTE — Progress Notes (Signed)
In as scheduled for injection. He had no showed last week. States he is spending today with his son who is almost 8. 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 He states he is doing well. Got his heat turned on. He is no longer working, states he just couldn't handle it. Also, reports he has his moped up and running and will be bringing himself here for his shot from [place] which takes him almost an hour to get here. He is trying to take some off his mom who is caring for her mom who has dementia. Good personal appearance, Is pleasant and appropriate. Rescheduled for one month with both provider and for his injection. Today he received Invega 156 mg in R deltoid.

## 2020-07-26 ENCOUNTER — Encounter (HOSPITAL_COMMUNITY): Payer: No Payment, Other | Admitting: Psychiatry

## 2020-07-26 ENCOUNTER — Ambulatory Visit (HOSPITAL_COMMUNITY): Payer: No Payment, Other

## 2020-08-09 ENCOUNTER — Ambulatory Visit (HOSPITAL_COMMUNITY): Payer: No Payment, Other

## 2020-08-09 ENCOUNTER — Encounter (HOSPITAL_COMMUNITY): Payer: Self-pay | Admitting: Psychiatry

## 2020-08-09 ENCOUNTER — Other Ambulatory Visit: Payer: Self-pay

## 2020-08-09 ENCOUNTER — Ambulatory Visit (INDEPENDENT_AMBULATORY_CARE_PROVIDER_SITE_OTHER): Payer: No Payment, Other | Admitting: Psychiatry

## 2020-08-09 VITALS — BP 112/94 | HR 87 | Ht 71.0 in | Wt 255.0 lb

## 2020-08-09 DIAGNOSIS — F411 Generalized anxiety disorder: Secondary | ICD-10-CM | POA: Diagnosis not present

## 2020-08-09 DIAGNOSIS — F209 Schizophrenia, unspecified: Secondary | ICD-10-CM | POA: Diagnosis not present

## 2020-08-09 MED ORDER — PALIPERIDONE PALMITATE ER 156 MG/ML IM SUSY
156.0000 mg | PREFILLED_SYRINGE | INTRAMUSCULAR | Status: DC
  Administered 2020-08-09 – 2021-04-13 (×7): 156 mg via INTRAMUSCULAR

## 2020-08-09 MED ORDER — PALIPERIDONE PALMITATE ER 156 MG/ML IM SUSY: 156.0000 mg | PREFILLED_SYRINGE | INTRAMUSCULAR | 9 refills | Status: DC

## 2020-08-09 MED ORDER — SERTRALINE HCL 50 MG PO TABS: 50.0000 mg | ORAL_TABLET | Freq: Every day | ORAL | 2 refills | Status: DC

## 2020-08-09 NOTE — Progress Notes (Signed)
Patient ID: Marc Hart, male   DOB: [birth year], 29 y.o.   MRN: [id number] In as a walk in for his monthly injection and also needs to be seen this am per a provider as he hasnt been seen by her since 7/21. He is two weeks late for his injection. Given Invega Sustenna 156 mg in L deltoid. He reports having a nice Thanksgiving with his son and split his time between both of his parents. He reports having a girlfriend now for 3 years. He is upbeat and demonstrates humor. He has a phone appt with his attorney today re his disability. He is stable and denies any sx of psychosis. He is to return in one month on Sep 05 2020.

## 2020-08-09 NOTE — Progress Notes (Signed)
BH MD/PA/NP OP Progress Note  08/09/2020 9:03 AM Marc Hart  MRN:  [id number]  Chief Complaint: Things are better Chief Complaint    Medication Management     HPI: 29 year old male seen today for follow-up psychiatric evaluation.  He has a psychiatric history of depression, schizophrenia, alcohol dependence, substance-induced mood disorder, schizoaffective and ADHD.  He is currently managed on Abilify 10 mg daily, Zoloft 25 mg, and Invega 156 mg every 28 days. He noted that his medications are somewhat effective in managing his psychiatric conditions. Today he reports that he anxious and depressed.   Today he is well-groomed, pleasant, cooperative, engaged in conversation, and maintained eye contact. He noted that he is less depressed and his anxiety has improved since his last visit. Provider conducted a PHQ-9 and patient scored a 14.  Provider also conducted a GAD-7 and patient scored a 17.  Patient noted that at times he feels like his thoughts are strangled and he is unable to focus.  He also noted that at times he feels like he is awake when he is really dreaming.  He endorsed auditory hallucinations however could not elaborate on what his hallucinations saying to him.  He denies SI/HI/VH or paranoia.  Today patient is agreeable to reducing Abilify 10 mg to 5 mg for a week and then discontinuing it as Invega and Abilify can cause adverse effects when used together.  He is also agreeable to increase Zoloft 25 mg 50 mg to help manage anxiety and depression.  He will continue all other medications as prescribed.  No other concerns none at this time.  Visit Diagnosis:    ICD-10-CM   1. Schizophrenia, unspecified type (HCC)  F20.9   2. Generalized anxiety disorder  F41.1 sertraline (ZOLOFT) 50 MG tablet    Past Psychiatric History: Depression, Schizophrenia, Alcohol dependence, substance induced mood disorder, schizoaffective disorder, ADHD    Past Medical History:  Past Medical  History:  Diagnosis Date  . Schizophrenia (HCC)   . Substance abuse [facility])     Past Surgical History:  Procedure Laterality Date  . OTHER SURGICAL HISTORY     Ear Surgery     Family Psychiatric History: Father Bipolar, Sister anxiety,   Family History: History reviewed. No pertinent family history.  Social History:  Social History   Socioeconomic History  . Marital status: Single    Spouse name: Not on file  . Number of children: Not on file  . Years of education: Not on file  . Highest education level: Not on file  Occupational History  . Not on file  Tobacco Use  . Smoking status: Current Some Day Smoker  . Smokeless tobacco: Never Used  Vaping Use  . Vaping Use: Never used  Substance and Sexual Activity  . Alcohol use: Yes    Comment: 6-9 tallboy beers/day  . Drug use: Yes    Types: Marijuana, IV, Cocaine, Methamphetamines    Comment: heroin 1gram/day  . Sexual activity: Not on file  Other Topics Concern  . Not on file  Social History Narrative   ** Merged History Encounter **       Social Determinants of Health   Financial Resource Strain:   . Difficulty of Paying Living Expenses: Not on file  Food Insecurity:   . Worried About Programme researcher, broadcasting/film/video in the Last Year: Not on file  . Ran Out of Food in the Last Year: Not on file  Transportation Needs:   . Lack of Transportation (  Medical): Not on file  . Lack of Transportation (Non-Medical): Not on file  Physical Activity:   . Days of Exercise per Week: Not on file  . Minutes of Exercise per Session: Not on file  Stress:   . Feeling of Stress : Not on file  Social Connections:   . Frequency of Communication with Friends and Family: Not on file  . Frequency of Social Gatherings with Friends and Family: Not on file  . Attends Religious Services: Not on file  . Active Member of Clubs or Organizations: Not on file  . Attends Banker Meetings: Not on file  . Marital Status: Not on file     Allergies:  Allergies  Allergen Reactions  . Amoxicillin Anaphylaxis    Has patient had a PCN reaction causing immediate rash, facial/tongue/throat swelling, SOB or lightheadedness with hypotension: Yes Has patient had a PCN reaction causing severe rash involving mucus membranes or skin necrosis: No Has patient had a PCN reaction that required hospitalization No Has patient had a PCN reaction occurring within the last 10 years: Yes If all of the above answers are "NO", then may proceed with Cephalosporin use.  . Other Other (See Comments)    coppertone suntan lotion caused rash  . Peanut-Containing Drug Products Other (See Comments)    Upset stomach  . Tylenol [Acetaminophen] Itching    Knees itched  . Other Rash    Allergic reaction to coppertone suntan lotion  . Tylenol [Acetaminophen] Itching    "It makes my knees itchy"     Metabolic Disorder Labs: No results found for: HGBA1C, MPG No results found for: PROLACTIN No results found for: CHOL, TRIG, HDL, CHOLHDL, VLDL, LDLCALC No results found for: TSH  Therapeutic Level Labs: No results found for: LITHIUM No results found for: VALPROATE No components found for:  CBMZ  Current Medications: Current Outpatient Medications  Medication Sig Dispense Refill  . paliperidone (INVEGA SUSTENNA) 156 MG/ML SUSY injection Inject 1 mL (156 mg total) into the muscle every 28 (twenty-eight) days. Last administration 07/02/18. 1.2 mL 9  . sertraline (ZOLOFT) 50 MG tablet Take 1 tablet (50 mg total) by mouth daily. 30 tablet 2   Current Facility-Administered Medications  Medication Dose Route Frequency Provider Last Rate Last Admin  . paliperidone (INVEGA SUSTENNA) injection 156 mg  156 mg Intramuscular Q28 days Toy Cookey E, NP   156 mg at 08/09/20 1696     Musculoskeletal: Strength & Muscle Tone: within normal limits Gait & Station: normal Patient leans: N/A  Psychiatric Specialty Exam: Review of Systems  Blood  pressure (!) 112/94, pulse 87, height 5\' 11"  (1.803 m), weight 255 lb (115.7 kg), SpO2 99 %.Body mass index is 35.57 kg/m.  General Appearance: Well Groomed  Eye Contact:  Good  Speech:  Clear and Coherent and Normal Rate  Volume:  Normal  Mood:  Anxious  Affect:  Congruent  Thought Process:  Coherent, Goal Directed and Linear  Orientation:  Full (Time, Place, and Person)  Thought Content: Logical and Hallucinations: Auditory   Suicidal Thoughts:  No  Homicidal Thoughts:  No  Memory:  Immediate;   Good Recent;   Good Remote;   Good  Judgement:  Fair  Insight:  Fair  Psychomotor Activity:  Normal  Concentration:  Concentration: Good and Attention Span: Good  Recall:  Good  Fund of Knowledge: Good  Language: Good  Akathisia:  No  Handed:  Right  AIMS (if indicated): not done  Assets:  Communication  Skills Desire for Improvement Housing  ADL's:  Intact  Cognition: WNL  Sleep:  Good   Screenings: AIMS     [facility]  AIMS Total Score 0    GAD-7     Clinical Support [facility] 08[facility]09/2020 in [facility]  Total GAD-7 Score 17    PHQ2-9     Clinical Support [facility] 08/09/2020 in [facility]  PHQ-2 Total Score 5  PHQ-9 Total Score 14       Assessment and Plan: Patient endorses symptoms of anxiety and depression. Today patient is agreeable to reducing Abilify 10 mg to 5 mg for a week and then discontinuing it as Invega and Abilify can cause adverse effects when used together.  He is also agreeable to increase Zoloft 25 mg 50 mg to help manage anxiety and depression.  He will continue all other medications as prescribed  1. Schizophrenia, unspecified type (HCC)  Continue- paliperidone (INVEGA SUSTENNA) 156 MG/ML SUSY injection; Inject 1 mL (156 mg total) into the muscle every 28 (twenty-eight) days. Last administration 07/02/18.  Dispense: 1.2 mL;  Refill: 9 Continue- paliperidone (INVEGA SUSTENNA) injection 156 mg  2. Generalized anxiety disorder  Continue- sertraline (ZOLOFT) 50 MG tablet; Take 1 tablet (50 mg total) by mouth daily.  Dispense: 30 tablet; Refill: 2  Follow up in 3 months Shanna Cisco, NP 08/09/2020, 9:03 AM

## 2020-09-06 ENCOUNTER — Ambulatory Visit (INDEPENDENT_AMBULATORY_CARE_PROVIDER_SITE_OTHER): Payer: No Payment, Other | Admitting: *Deleted

## 2020-09-06 ENCOUNTER — Other Ambulatory Visit: Payer: Self-pay

## 2020-09-06 ENCOUNTER — Encounter (HOSPITAL_COMMUNITY): Payer: Self-pay

## 2020-09-06 VITALS — BP 134/74 | HR 77 | Resp 98 | Ht 71.0 in | Wt 252.0 lb

## 2020-09-06 DIAGNOSIS — F209 Schizophrenia, unspecified: Secondary | ICD-10-CM

## 2020-09-06 NOTE — Progress Notes (Signed)
In as scheduled for [place] injection of 156 mg. Today he got his shot in his R deltoid. He reports having a good holiday, spent it with his family and his son which he states he enjoyed. Denies any psychotic sx and is in good spirits showing his sense of humor today. Good personal appearance but he does have a malodorous smell. To return in one month for his next injection.

## 2020-09-14 ENCOUNTER — Telehealth (HOSPITAL_COMMUNITY): Payer: Self-pay | Admitting: *Deleted

## 2020-09-14 NOTE — Telephone Encounter (Signed)
Mom called and Dang on the phone with Clinical research associate long enough to give verbal permission for his Mom to talk with me. Per my chart one of my notes indicates he was given a note from [place] NP re his disability. He is unable to find it. He has a hearing next week re his disability. Will ask Brittney to produce another note for him and mom will pick it up tomorrow.

## 2020-09-15 NOTE — Telephone Encounter (Signed)
Letter written. Nursing staff made patient aware that he could pick letter up.

## 2020-09-15 NOTE — Telephone Encounter (Signed)
Letter provided for his disability attorney re an opinion written by NP Toy Cookey as to his ability to work.

## 2020-10-04 ENCOUNTER — Ambulatory Visit (HOSPITAL_COMMUNITY): Payer: No Payment, Other

## 2020-10-10 ENCOUNTER — Ambulatory Visit (HOSPITAL_COMMUNITY): Payer: No Payment, Other

## 2020-10-19 ENCOUNTER — Encounter (HOSPITAL_COMMUNITY): Payer: Self-pay

## 2020-10-19 ENCOUNTER — Ambulatory Visit (HOSPITAL_COMMUNITY): Payer: No Payment, Other | Admitting: *Deleted

## 2020-10-19 ENCOUNTER — Other Ambulatory Visit: Payer: Self-pay

## 2020-10-19 VITALS — BP 125/68 | HR 64 | Ht 71.0 in | Wt 256.0 lb

## 2020-10-19 DIAGNOSIS — F209 Schizophrenia, unspecified: Secondary | ICD-10-CM

## 2020-10-19 NOTE — Progress Notes (Signed)
In week or so late for his monthly injection of Invega S 156 mg. Today he got his shot in his L deltoid without difficulty. He reports he got denied for disability. He doesn't have medicaid and needs money for his medicines ie hepatitis.and schizoaffective d/o. He states he needs a full time job with benefits. Reports he has been vol for 6 months 3 X a week and enjoys that. Encouraged to start slow with work, part time or short shifts to get his work muscle built up and to find a job that he considers low stress. He denies other issues, states things are going well. To return in one month for his next injection.

## 2020-11-07 ENCOUNTER — Encounter (HOSPITAL_COMMUNITY): Payer: No Payment, Other | Admitting: Psychiatry

## 2020-11-16 ENCOUNTER — Other Ambulatory Visit: Payer: Self-pay

## 2020-11-16 ENCOUNTER — Encounter (HOSPITAL_COMMUNITY): Payer: Self-pay

## 2020-11-16 ENCOUNTER — Ambulatory Visit (HOSPITAL_COMMUNITY): Payer: No Payment, Other | Admitting: *Deleted

## 2020-11-16 VITALS — BP 111/66 | HR 79 | Ht 71.0 in | Wt 250.0 lb

## 2020-11-16 DIAGNOSIS — F209 Schizophrenia, unspecified: Secondary | ICD-10-CM

## 2020-11-16 NOTE — Progress Notes (Signed)
In a week late for his scheduled injection of Invega 156 mg, given today in his R deltoid. Today he reports being depressed and having thoughts of suicide without a plan. States he doesn't want to go to the hospital, thinks that would set him back further. Called his mom to inform her of his feelings, she believes his mood is related to the pressure she is putting on him to get work, at least part time for some income. He doesn't receive any benefits, and recently was denied disability. Will see him next week to apply for benefits, food stamps and medicaid, asked mom to back off job pressure right now and she agreed. Will see Dr Doyne Keel for med assessment on 3/28 and next injection in one month. He is able to promise his safety at this time. To call if needed.

## 2020-11-21 ENCOUNTER — Ambulatory Visit (HOSPITAL_COMMUNITY): Payer: No Payment, Other

## 2020-11-21 ENCOUNTER — Other Ambulatory Visit: Payer: Self-pay

## 2020-11-22 ENCOUNTER — Telehealth (HOSPITAL_COMMUNITY): Payer: Self-pay | Admitting: *Deleted

## 2020-11-22 NOTE — Telephone Encounter (Signed)
Scheduled with writer today to apply for food stamps. He shared last week as did his mom when she was called that he is feeling depressed and discouraged given his financial situation. He receives no benefits, has been unsuccessful at working and his mom is equally frustrated and expects some money to allow him to stay in a house she owns. Mailed off his application which he was able to participate in completing.

## 2020-11-28 ENCOUNTER — Ambulatory Visit (HOSPITAL_COMMUNITY): Payer: No Payment, Other | Admitting: Psychiatry

## 2020-11-28 ENCOUNTER — Telehealth (HOSPITAL_COMMUNITY): Payer: Self-pay | Admitting: *Deleted

## 2020-11-28 ENCOUNTER — Other Ambulatory Visit: Payer: Self-pay

## 2020-11-28 ENCOUNTER — Encounter (HOSPITAL_COMMUNITY): Payer: Self-pay | Admitting: Psychiatry

## 2020-11-28 ENCOUNTER — Telehealth (INDEPENDENT_AMBULATORY_CARE_PROVIDER_SITE_OTHER): Payer: No Payment, Other | Admitting: Psychiatry

## 2020-11-28 DIAGNOSIS — F411 Generalized anxiety disorder: Secondary | ICD-10-CM

## 2020-11-28 DIAGNOSIS — F209 Schizophrenia, unspecified: Secondary | ICD-10-CM | POA: Diagnosis not present

## 2020-11-28 MED ORDER — SERTRALINE HCL 50 MG PO TABS: 50.0000 mg | ORAL_TABLET | Freq: Every day | ORAL | 2 refills | Status: DC

## 2020-11-28 MED ORDER — TRAZODONE HCL 50 MG PO TABS: 50.0000 mg | ORAL_TABLET | Freq: Every evening | ORAL | 2 refills | Status: DC | PRN

## 2020-11-28 MED ORDER — PALIPERIDONE PALMITATE ER 156 MG/ML IM SUSY: 156.0000 mg | PREFILLED_SYRINGE | INTRAMUSCULAR | 9 refills | Status: DC

## 2020-11-28 NOTE — Telephone Encounter (Signed)
Have tried calling him several times over several days and not getting an answer. Called mom, spoke to her briefly as she is at work and she said he has been without minutes but has some now. She is very frustrated with him, feels he can do more and has just in mode of being helpless right now. Mom is happy for me to help him but she is out of it for now. Due to not having a phone till this weekend, he has not followed up with the food stamp app and mom called by them as the landlord and she said she expects and its part of his contract for him to pay her 500 monthly, he reported 100. Marc Hart called me shortly after I spoke with his mom. Told him the appt today with the provider would be virtual since he doesn't have transportation today. Also, encouraged him to call food stamps himself to show interest and effort, not sure how hard food stamps with work for him if they can never reach him. Lastly spoke with him about using [facility]ational [facility]hab services and he is interested in it so I will make a referral for him. He expressed his appreciation.

## 2020-11-28 NOTE — Progress Notes (Signed)
BH MD/PA/NP OP Progress Note Virtual Visit via Telephone Note  I connected with Marc Hart on 11/28/20 at  3:00 PM EDT by telephone and verified that I am speaking with the correct person using two identifiers.  Location: Patient: home Provider: Clinic   I discussed the limitations, risks, security and privacy concerns of performing an evaluation and management service by telephone and the availability of in person appointments. I also discussed with the patient that there may be a patient responsible charge related to this service. The patient expressed understanding and agreed to proceed.   I provided 30 minutes of non-face-to-face time during this encounter.   11/28/2020 3:49 PM Marc Hart  MRN:  [id number]  Chief Complaint: " I relapsed on methamphetamines last month and at times I have difficulties distinguishing reality"  HPI: 30 year old male seen today for follow-up psychiatric evaluation.  He has a psychiatric history of depression, schizophrenia, alcohol dependence, substance-induced mood disorder, methamphetamine use, schizoaffective and ADHD.  He is currently managed on Zoloft 50 mg, and Invega 156 mg every 28 days. He noted that his medications are somewhat effective in managing his psychiatric conditions.   Today patient was unable to log on virtually so his assessment was done over the phone.  During exam he was pleasant, cooperative, engaged in conversation, and maintained eye contact. He noted last month he relapsed on methamphetamines.  He notes that since relapsing his sleep patterns have been off and reports at times he has difficulty distinguishing reality.  He notes that he is now sober however notes that he has increased anxiety and depression.  Provider conducted a GAD-7 and patient scored a 17, at his last visit he scored a 17.  Provider also conducted a PHQ-9 and patient scored a 14, at his last visit he scored a 14.  He endorses passive SI however notes  that he does not want to harm himself.  He denies SI/HI/VAH or paranoia.  Patient notes that since relapsing on methamphetamine his sleep has been poor, noting that he sleeps 3 to 4 hours nightly.  He notes that his appetite is adequate.  Today patient is agreeable start trazodone 25 to 50 mg as needed for sleep.  He notes that he is not taking Zoloft in a while and reports that he will restart it.  He will continue all other medications as prescribed.   No other concerns none at this time.  Visit Diagnosis:    ICD-10-CM   1. Schizophrenia, unspecified type (HCC)  F20.9 paliperidone (INVEGA SUSTENNA) 156 MG/ML SUSY injection    traZODone (DESYREL) 50 MG tablet  2. Generalized anxiety disorder  F41.1 sertraline (ZOLOFT) 50 MG tablet    Past Psychiatric History: Depression, methamphetamine use schizophrenia, Alcohol dependence, substance induced mood disorder, schizoaffective disorder, ADHD    Past Medical History:  Past Medical History:  Diagnosis Date  . Schizophrenia (HCC)   . Substance abuse [facility])     Past Surgical History:  Procedure Laterality Date  . OTHER SURGICAL HISTORY     Ear Surgery     Family Psychiatric History: Father Bipolar, Sister anxiety,   Family History: History reviewed. No pertinent family history.  Social History:  Social History   Socioeconomic History  . Marital status: Single    Spouse name: Not on file  . Number of children: Not on file  . Years of education: Not on file  . Highest education level: Not on file  Occupational History  . Not on file  Tobacco Use  . Smoking status: Current Some Day Smoker  . Smokeless tobacco: Never Used  Vaping Use  . Vaping Use: Never used  Substance and Sexual Activity  . Alcohol use: Yes    Comment: 6-9 tallboy beers/day  . Drug use: Yes    Types: Marijuana, IV, Cocaine, Methamphetamines    Comment: heroin 1gram/day  . Sexual activity: Not on file  Other Topics Concern  . Not on file  Social History  Narrative   ** Merged History Encounter **       Social Determinants of Health   Financial Resource Strain: Not on file  Food Insecurity: Not on file  Transportation Needs: Not on file  Physical Activity: Not on file  Stress: Not on file  Social Connections: Not on file    Allergies:  Allergies  Allergen Reactions  . Amoxicillin Anaphylaxis    Has patient had a PCN reaction causing immediate rash, facial/tongue/throat swelling, SOB or lightheadedness with hypotension: Yes Has patient had a PCN reaction causing severe rash involving mucus membranes or skin necrosis: No Has patient had a PCN reaction that required hospitalization No Has patient had a PCN reaction occurring within the last 10 years: Yes If all of the above answers are "NO", then may proceed with Cephalosporin use.  . Other Other (See Comments)    coppertone suntan lotion caused rash  . Peanut-Containing Drug Products Other (See Comments)    Upset stomach  . Tylenol [Acetaminophen] Itching    Knees itched  . Other Rash    Allergic reaction to coppertone suntan lotion  . Tylenol [Acetaminophen] Itching    "It makes my knees itchy"     Metabolic Disorder Labs: No results found for: HGBA1C, MPG No results found for: PROLACTIN No results found for: CHOL, TRIG, HDL, CHOLHDL, VLDL, LDLCALC No results found for: TSH  Therapeutic Level Labs: No results found for: LITHIUM No results found for: VALPROATE No components found for:  CBMZ  Current Medications: Current Outpatient Medications  Medication Sig Dispense Refill  . traZODone (DESYREL) 50 MG tablet Take 1 tablet (50 mg total) by mouth at bedtime as needed for sleep. 30 tablet 2  . paliperidone (INVEGA SUSTENNA) 156 MG/ML SUSY injection Inject 1 mL (156 mg total) into the muscle every 28 (twenty-eight) days. Last administration 07/02/18. 1.2 mL 9  . sertraline (ZOLOFT) 50 MG tablet Take 1 tablet (50 mg total) by mouth daily. 30 tablet 2   Current  Facility-Administered Medications  Medication Dose Route Frequency Provider Last Rate Last Admin  . paliperidone (INVEGA SUSTENNA) injection 156 mg  156 mg Intramuscular Q28 days Toy Cookey E, NP   156 mg at 11/16/20 1602     Musculoskeletal: Strength & Muscle Tone: Unable to assess due to telephone visit Gait & Station: Unable to assess due to telephone visit Patient leans: N/A  Psychiatric Specialty Exam: Review of Systems  There were no vitals taken for this visit.There is no height or weight on file to calculate BMI.  General Appearance: Unable to assess due to telephone visit  Eye Contact:  Unable to assess due to telephone visit  Speech:  Clear and Coherent and Normal Rate  Volume:  Normal  Mood:  Anxious and Depressed  Affect:  Congruent  Thought Process:  Coherent, Goal Directed and Linear  Orientation:  Full (Time, Place, and Person)  Thought Content: WDL and Logical   Suicidal Thoughts:  Yes.  without intent/plan  Homicidal Thoughts:  No  Memory:  Immediate;   Good Recent;   Good Remote;   Good  Judgement:  Fair  Insight:  Fair  Psychomotor Activity:  Normal  Concentration:  Concentration: Good and Attention Span: Good  Recall:  Good  Fund of Knowledge: Good  Language: Good  Akathisia:  No  Handed:  Right  AIMS (if indicated): not done  Assets:  Communication Skills Desire for Improvement Housing  ADL's:  Intact  Cognition: WNL  Sleep:  Poor   Screenings: AIMS   Flowsheet Row [facility]  AIMS Total Score 0    GAD-7   Flowsheet Row Video Visit from 11/28/2020 in [facility] Clinical Support from 08/09/2020 in [facility]  Total GAD-7 Score 17 17    PHQ2-9   Flowsheet Row Video Visit from 11/28/2020 in [facility] Clinical Support from 08/09/2020 in [facility]   PHQ-2 Total Score 4 5  PHQ-9 Total Score 14 14    Flowsheet Row Video Visit from 11/28/2020 in [facility] ED from 07/27/2018 in [facility] from 07/02/2018 in [facility] EMERGENCY DEPARTMENT  C-SSRS RISK CATEGORY Error: Q7 should not be populated when Q6 is No High Risk High Risk       Assessment and Plan: Patient endorses symptoms of insomnia, anxiety and depression.  Today he is agreeable to starting trazodone 25 mg to 50 mg as needed to help manage sleep.  He notes that he stopped taking Zoloft however would like to restart Zoloft 50 mg daily to help manage anxiety and depression.  He will continue on the medications as prescribed.   1. Schizophrenia, unspecified type (HCC)  Continue- paliperidone (INVEGA SUSTENNA) 156 MG/ML SUSY injection; Inject 1 mL (156 mg total) into the muscle every 28 (twenty-eight) days. Last administration 07/02/18.  Dispense: 1.2 mL; Refill: 9 Start- traZODone (DESYREL) 50 MG tablet; Take 1 tablet (50 mg total) by mouth at bedtime as needed for sleep.  Dispense: 30 tablet; Refill: 2  2. Generalized anxiety disorder  Restart- sertraline (ZOLOFT) 50 MG tablet; Take 1 tablet (50 mg total) by mouth daily.  Dispense: 30 tablet; Refill: 2   Follow up in 3 months Shanna Cisco, NP 11/28/2020, 3:49 PM

## 2020-12-05 ENCOUNTER — Telehealth (HOSPITAL_COMMUNITY): Payer: Self-pay | Admitting: *Deleted

## 2020-12-05 NOTE — Telephone Encounter (Signed)
Referred Pj to Vocational Rehab today. He did receive 8 days of food stamps, he needs per his report a 86-50 form which he doesn't know where to find it, Clinical research associate will try to follow up on this.

## 2020-12-06 ENCOUNTER — Encounter (HOSPITAL_COMMUNITY): Payer: Self-pay

## 2020-12-06 ENCOUNTER — Emergency Department (HOSPITAL_COMMUNITY)
Admission: EM | Admit: 2020-12-06 | Discharge: 2020-12-07 | Disposition: A | Discharge status: Home / Self Care | Payer: Medicaid Other | Attending: Emergency Medicine | Admitting: Emergency Medicine

## 2020-12-06 ENCOUNTER — Other Ambulatory Visit: Payer: Self-pay

## 2020-12-06 DIAGNOSIS — X838XXA Intentional self-harm by other specified means, initial encounter: Secondary | ICD-10-CM | POA: Insufficient documentation

## 2020-12-06 DIAGNOSIS — T1491XA Suicide attempt, initial encounter: Secondary | ICD-10-CM | POA: Insufficient documentation

## 2020-12-06 DIAGNOSIS — F17200 Nicotine dependence, unspecified, uncomplicated: Secondary | ICD-10-CM | POA: Insufficient documentation

## 2020-12-06 DIAGNOSIS — F209 Schizophrenia, unspecified: Secondary | ICD-10-CM | POA: Insufficient documentation

## 2020-12-06 DIAGNOSIS — Z20822 Contact with and (suspected) exposure to covid-19: Secondary | ICD-10-CM | POA: Insufficient documentation

## 2020-12-06 DIAGNOSIS — Z91010 Allergy to peanuts: Secondary | ICD-10-CM | POA: Insufficient documentation

## 2020-12-06 DIAGNOSIS — T50902A Poisoning by unspecified drugs, medicaments and biological substances, intentional self-harm, initial encounter: Secondary | ICD-10-CM

## 2020-12-06 DIAGNOSIS — T43212A Poisoning by selective serotonin and norepinephrine reuptake inhibitors, intentional self-harm, initial encounter: Secondary | ICD-10-CM | POA: Insufficient documentation

## 2020-12-06 LAB — CBC
HCT: 49.7 % (ref 39.0–52.0)
Hemoglobin: 16.7 g/dL (ref 13.0–17.0)
MCH: 30.9 pg (ref 26.0–34.0)
MCHC: 33.6 g/dL (ref 30.0–36.0)
MCV: 91.9 fL (ref 80.0–100.0)
Platelets: 214 10*3/uL (ref 150–400)
RBC: 5.41 MIL/uL (ref 4.22–5.81)
RDW: 13.3 % (ref 11.5–15.5)
WBC: 7.4 10*3/uL (ref 4.0–10.5)
nRBC: 0 % (ref 0.0–0.2)

## 2020-12-06 LAB — COMPREHENSIVE METABOLIC PANEL
ALT: 299 U/L — ABNORMAL HIGH (ref 0–44)
AST: 165 U/L — ABNORMAL HIGH (ref 15–41)
Albumin: 4.5 g/dL (ref 3.5–5.0)
Alkaline Phosphatase: 91 U/L (ref 38–126)
Anion gap: 12 (ref 5–15)
BUN: 18 mg/dL (ref 6–20)
CO2: 24 mmol/L (ref 22–32)
Calcium: 9.7 mg/dL (ref 8.9–10.3)
Chloride: 102 mmol/L (ref 98–111)
Creatinine, Ser: 0.82 mg/dL (ref 0.61–1.24)
GFR, Estimated: >60 mL/min (ref >60)
Glucose, Bld: 93 mg/dL (ref 70–99)
Potassium: 3.8 mmol/L (ref 3.5–5.1)
Sodium: 138 mmol/L (ref 135–145)
Total Bilirubin: 1 mg/dL (ref 0.3–1.2)
Total Protein: 7.9 g/dL (ref 6.5–8.1)

## 2020-12-06 LAB — SALICYLATE LEVEL: Salicylate Lvl: <7 mg/dL — ABNORMAL LOW (ref 7.0–30.0)

## 2020-12-06 LAB — RAPID URINE DRUG SCREEN, HOSP PERFORMED
Amphetamines: NOT DETECTED
Barbiturates: NOT DETECTED
Benzodiazepines: NOT DETECTED
Cocaine: NOT DETECTED
Opiates: NOT DETECTED
Tetrahydrocannabinol: POSITIVE — AB

## 2020-12-06 LAB — ETHANOL: Alcohol, Ethyl (B): <10 mg/dL (ref <10)

## 2020-12-06 LAB — MAGNESIUM: Magnesium: 1.8 mg/dL (ref 1.7–2.4)

## 2020-12-06 LAB — CBG MONITORING, ED: Glucose-Capillary: 87 mg/dL (ref 70–99)

## 2020-12-06 LAB — ACETAMINOPHEN LEVEL: Acetaminophen (Tylenol), Serum: <10 ug/mL — ABNORMAL LOW (ref 10–30)

## 2020-12-06 MED ORDER — LORAZEPAM 2 MG/ML IJ SOLN
0.5000 mg | Freq: Once | INTRAMUSCULAR | Status: AC
  Administered 2020-12-06: 0.5 mg via INTRAMUSCULAR

## 2020-12-06 MED ORDER — LORAZEPAM 2 MG/ML IJ SOLN
0.5000 mg | Freq: Once | INTRAMUSCULAR | Status: DC
  Filled 2020-12-06: qty 1

## 2020-12-06 MED ORDER — SERTRALINE HCL 50 MG PO TABS
50.0000 mg | ORAL_TABLET | Freq: Every day | ORAL | Status: DC
  Administered 2020-12-07: 50 mg via ORAL
  Filled 2020-12-06: qty 1

## 2020-12-06 NOTE — ED Notes (Signed)
Pt vomiting. Provider made aware. Awaiting orders.

## 2020-12-06 NOTE — ED Triage Notes (Signed)
Presents for complaints of suicidal ideation and attempt Reported he took 10 trazodone  States he has been taking a different medication for his schizophrenia and it causes awful nightmares Reports it "finally caught up with him"  Took the trazodone and called 911 Stated he threw up in the EMS ride and saw the pills come up

## 2020-12-06 NOTE — ED Notes (Signed)
Spoke with Angelique Blonder with poison control  Recommended current tx plan with cardiac monitoring for 6 hrs  Will call back to recheck in patient  Tammy PA aware  Pt alert and cooperative 1:1 sitter monitoring patient per order

## 2020-12-06 NOTE — ED Notes (Addendum)
Pt ambulated to bathroom at this time, without difficulty. Gait steady.

## 2020-12-06 NOTE — ED Notes (Signed)
Spoke with Angelique Blonder, [employer], and updated her on patient status. She recommended checking patient's magnesium level and a repeat EKG.  EKG was done while she was on the phone and she was updated with findings. She stated that she would call back later for magnesium lab results.

## 2020-12-06 NOTE — ED Provider Notes (Signed)
[facility] EMERGENCY DEPARTMENT Provider Note   CSN: [id number] Arrival date & time: 12/06/20  1654     History Chief Complaint  Patient presents with  . Suicide Attempt    Marc Hart is a 30 y.o. male.  HPI      Marc Hart is a 30 y.o. male with past medical history of polysubstance abuse and schizophrenia who presents to the emergency room via EMS for evaluation of suicidal attempt.  States that he has been increasingly depressed for some time.  He was recently given a prescription for trazodone and states that he took 10 trazodone tablets earlier today.  Does not know what time he took the medication but states that he took all 10 at the same time.  He also endorses hearing voices recently that are telling him to hurt himself.  He called 911 after taking the medication and states that he vomited one time on the way to the ER.  He is unsure if there were pill fragments in the vomitus.  Describes his recent increased depression is related to loneliness. Denies any other substance use or recent alcohol.  Also denies abdominal pain, nausea or vomiting, chest pain or shortness of breath.  He had a televisit with behavioral health on 11/28/2020 in which he reported having difficulty sleeping and trazodone was started   Past Medical History:  Diagnosis Date  . Schizophrenia ([place])   . Substance abuse [facility])     Patient Active Problem List   Diagnosis Date Noted  . Suicidal thoughts   . Schizophrenia ([place]) 06/26/2018  . Depression   . Polysubstance abuse ([place])   . Substance induced mood disorder ([place]) 09/25/2015  . Alcohol dependence with uncomplicated withdrawal ([place]) 09/16/2015  . Depressive disorder 09/12/2015  . History of schizophrenia   . Schizoaffective disorder ([place]) 12/18/2014    Past Surgical History:  Procedure Laterality Date  . OTHER SURGICAL HISTORY     Ear Surgery        History reviewed. No pertinent family history.  Social History    Tobacco Use  . Smoking status: Current Some Day Smoker  . Smokeless tobacco: Never Used  Vaping Use  . Vaping Use: Never used  Substance Use Topics  . Alcohol use: Yes    Comment: 6-9 tallboy beers/day  . Drug use: Yes    Types: Marijuana, IV, Cocaine, Methamphetamines    Comment: heroin 1gram/day    Home Medications Prior to Admission medications   Medication Sig Start Date End Date Taking? Authorizing Provider  paliperidone (INVEGA SUSTENNA) 156 MG/ML SUSY injection Inject 1 mL (156 mg total) into the muscle every 28 (twenty-eight) days. Last administration 07/02/18. 11/28/20   Salley Slaughter, NP  sertraline (ZOLOFT) 50 MG tablet Take 1 tablet (50 mg total) by mouth daily. 11/28/20   Salley Slaughter, NP  traZODone (DESYREL) 50 MG tablet Take 1 tablet (50 mg total) by mouth at bedtime as needed for sleep. 11/28/20   Salley Slaughter, NP    Allergies    Amoxicillin, Other, Peanut-containing drug products, Tylenol [acetaminophen], Other, and Tylenol [acetaminophen]  Review of Systems   Review of Systems  Constitutional: Negative for chills, fatigue and fever.  HENT: Negative for sore throat and trouble swallowing.   Respiratory: Negative for cough and shortness of breath.   Cardiovascular: Negative for chest pain.  Gastrointestinal: Negative for abdominal pain, nausea and vomiting.  Genitourinary: Negative for dysuria, flank pain and hematuria.  Musculoskeletal: Negative for arthralgias,  back pain, myalgias, neck pain and neck stiffness.  Skin: Negative for rash.  Neurological: Negative for dizziness, weakness and numbness.  Hematological: Does not bruise/bleed easily.  Psychiatric/Behavioral: Positive for hallucinations and suicidal ideas. Negative for confusion.    Physical Exam Updated Vital Signs BP 129/75 (BP Location: Right Leg)   Pulse 71   Temp 98.2 F (36.8 C) (Oral)   Resp 16   Ht _0  (1.803 m)   Wt 113.4 kg   SpO2 100%   BMI 34.87 kg/m    Physical Exam Vitals and nursing note reviewed.  Constitutional:      General: He is not in acute distress.    Appearance: Normal appearance. He is not toxic-appearing.  HENT:     Mouth/Throat:     Mouth: Mucous membranes are moist.  Eyes:     Extraocular Movements: Extraocular movements intact.     Conjunctiva/sclera: Conjunctivae normal.     Pupils: Pupils are equal, round, and reactive to light.  Cardiovascular:     Rate and Rhythm: Normal rate and regular rhythm.     Pulses: Normal pulses.  Pulmonary:     Effort: Pulmonary effort is normal.     Breath sounds: Normal breath sounds.  Chest:     Chest wall: No tenderness.  Abdominal:     Palpations: Abdomen is soft.     Tenderness: There is no abdominal tenderness.  Musculoskeletal:        General: Normal range of motion.     Right lower leg: No edema.     Left lower leg: No edema.  Skin:    General: Skin is warm.     Capillary Refill: Capillary refill takes less than 2 seconds.     Findings: No rash.  Neurological:     Mental Status: He is alert.     Sensory: No sensory deficit.     Motor: No weakness.  Psychiatric:        Mood and Affect: Mood is depressed. Affect is flat.        Speech: Speech normal.        Behavior: Behavior is withdrawn. Behavior is cooperative.        Thought Content: Thought content includes suicidal ideation. Thought content includes suicidal plan.     Comments: Patient does not appear to be interacting with internal stimuli     ED Results / Procedures / Treatments   Labs (all labs ordered are listed, but only abnormal results are displayed) Labs Reviewed  COMPREHENSIVE METABOLIC PANEL - Abnormal; Notable for the following components:      Result Value   AST 165 (*)    ALT 299 (*)    All other components within normal limits  SALICYLATE LEVEL - Abnormal; Notable for the following components:   Salicylate Lvl <7.5 (*)    All other components within normal limits  ACETAMINOPHEN  LEVEL - Abnormal; Notable for the following components:   Acetaminophen (Tylenol), Serum <10 (*)    All other components within normal limits  RAPID URINE DRUG SCREEN, HOSP PERFORMED - Abnormal; Notable for the following components:   Tetrahydrocannabinol POSITIVE (*)    All other components within normal limits  ETHANOL  CBC  CBG MONITORING, ED    EKG EKG Interpretation  Date/Time:  Tuesday December 06 2020 18:30:15 EDT Ventricular Rate:  84 PR Interval:  134 QRS Duration: 93 QT Interval:  416 QTC Calculation: 492 R Axis:   94 Text Interpretation: Sinus rhythm LAE, consider  biatrial enlargement Borderline right axis deviation Prolonged QT interval Baseline wander in lead(s) V2 Since last tracing QT has lengthened Otherwise no significant change Confirmed by Daleen Bo [phone]) on 12/06/2020 8:05:43 PM   Radiology No results found.  Procedures Procedures   Medications Ordered in ED Medications - No data to display  ED Course  I have reviewed the triage vital signs and the nursing notes.  Pertinent labs & imaging results that were available during my care of the patient were reviewed by me and considered in my medical decision making (see chart for details).    MDM Rules/Calculators/A&P                          Patient here via EMS after admittedly taking 10 trazodone tablets earlier today.  Reports increasing depression for several days.  Also endorses having some auditory hallucinations that he states are telling him to harm himself.  Had a telephone visit with behavioral health on 11/28/2020 and endorsed use of methamphetamines and difficulty sleeping.  Trazodone was prescribed.  On exam, patient has flat affect and depressed mood.  Endorses specific suicidal plan.  Will obtain labs and contact poison control.  If medically clear will consult TTS patient is alert and cooperative at present.  Nursing spoke with poison control who recommends 6-hour cardiac monitoring and  labs.  Notified by nursing staff that patient actively vomiting, review of EKG shows some prolonged QT, will order benzodiazepine for his vomiting.  2040, patient resting comfortably.  Labs show no leukocytosis, UDS positive for THC, AST elevated at 165 ALT 299 alk phos unremarkable.  Ethanol, salicylate, and Tylenol levels unremarkable.  No leukocytosis.  no continued vomiting.    2300 nursing staff spoke with poison control who is recommending magnesium level and repeat EKG.  If magnesium level unremarkable and no significant EKG changes, patient will likely be medically cleared.  A.m. medication ordered and awaiting TTS consult.  Patient remains cooperative and voluntary  2340  Discussed with overnight provider that pt likely will be medically cleared and awaiting recommendation from TTS.  Pt will need IVC if becomes agitated or threatens to leave.    Final Clinical Impression(s) / ED Diagnoses Final diagnoses:  Suicidal overdose, initial encounter [facility])    Rx / DC Orders ED Discharge Orders    None       Kem Parkinson, PA-C 12/06/20 2348    Daleen Bo, MD 12/07/20 [phone]

## 2020-12-07 ENCOUNTER — Encounter (HOSPITAL_COMMUNITY): Payer: Self-pay

## 2020-12-07 ENCOUNTER — Inpatient Hospital Stay (HOSPITAL_COMMUNITY)
Admission: RE | Admit: 2020-12-07 | Discharge: 2020-12-12 | DRG: 885 | Disposition: A | Discharge status: Home / Self Care | Payer: Federal, State, Local not specified - Other | Source: Intra-hospital | Attending: Psychiatry | Admitting: Psychiatry

## 2020-12-07 DIAGNOSIS — F201 Disorganized schizophrenia: Secondary | ICD-10-CM | POA: Diagnosis not present

## 2020-12-07 DIAGNOSIS — F17200 Nicotine dependence, unspecified, uncomplicated: Secondary | ICD-10-CM | POA: Diagnosis present

## 2020-12-07 DIAGNOSIS — G4700 Insomnia, unspecified: Secondary | ICD-10-CM | POA: Diagnosis present

## 2020-12-07 DIAGNOSIS — Z9119 Patient's noncompliance with other medical treatment and regimen: Secondary | ICD-10-CM

## 2020-12-07 DIAGNOSIS — T43212A Poisoning by selective serotonin and norepinephrine reuptake inhibitors, intentional self-harm, initial encounter: Secondary | ICD-10-CM | POA: Diagnosis present

## 2020-12-07 DIAGNOSIS — F32A Depression, unspecified: Secondary | ICD-10-CM | POA: Diagnosis present

## 2020-12-07 DIAGNOSIS — F259 Schizoaffective disorder, unspecified: Principal | ICD-10-CM | POA: Diagnosis present

## 2020-12-07 DIAGNOSIS — Z79899 Other long term (current) drug therapy: Secondary | ICD-10-CM

## 2020-12-07 DIAGNOSIS — F250 Schizoaffective disorder, bipolar type: Secondary | ICD-10-CM | POA: Diagnosis not present

## 2020-12-07 DIAGNOSIS — Z880 Allergy status to penicillin: Secondary | ICD-10-CM

## 2020-12-07 DIAGNOSIS — F209 Schizophrenia, unspecified: Secondary | ICD-10-CM | POA: Diagnosis present

## 2020-12-07 DIAGNOSIS — F411 Generalized anxiety disorder: Secondary | ICD-10-CM | POA: Diagnosis present

## 2020-12-07 LAB — RESP PANEL BY RT-PCR (FLU A&B, COVID) ARPGX2
Influenza A by PCR: NEGATIVE
Influenza B by PCR: NEGATIVE
SARS Coronavirus 2 by RT PCR: NEGATIVE

## 2020-12-07 MED ORDER — FAMOTIDINE 20 MG PO TABS
20.0000 mg | ORAL_TABLET | Freq: Once | ORAL | Status: AC
  Administered 2020-12-07: 20 mg via ORAL
  Filled 2020-12-07: qty 1

## 2020-12-07 NOTE — BH Assessment (Addendum)
Comprehensive Clinical Assessment (CCA) Note  12/07/2020 Marc Hart [id number] Disposition: Clinician discussed patient care with Otila Back, PA who recommends inpatient psychiatric care.  Clinician used secure message to inform Dr. Lajean Saver.  Patient still needs 1:1 staffing due to high risk for suicide.   Flowsheet Row ED from 12/06/2020 in [facility] EMERGENCY DEPARTMENT Video Visit from 11/28/2020 in [facility] ED from 07/27/2018 in [facility] [place] [facility]  C-SSRS RISK CATEGORY High Risk Error: Q7 should not be populated when Q6 is No High Risk     The patient demonstrates the following risk factors for suicide: Chronic risk factors for suicide include: psychiatric disorder of schizophrenia and previous suicide attempts x1.. Acute risk factors for suicide include: unemployment and social withdrawal/isolation. Protective factors for this patient include: positive therapeutic relationship and responsibility to others (children, family). Considering these factors, the overall suicide risk at this point appears to be high. Patient is not appropriate for outpatient follow up.  Patient took 10 trazadone around 16:00 on 04/05. He did this in an effort to kill himself. Patient has had previous suicide attempt. Patient denies any HI. Patient hears voices that tell him negative things. Patient has no visual hallucinations  Patient was by himself when he intentionally took 10 tarzadone to kill himself. He called EMS right after the ingestion. He has had one previous suicide attempt. He said he thinks he did it because he has been hearing voices telling him to harm himself. Patient admits to depression but cannot pinpoint any other things that may have lead to him attempting suicide. Pt denies any Hi. He hears voices telling him negative things. Pt says that he had bad nightmares from one of the medications he was taking. He is no longer taking that  medication.  Patient has a flat affect.  He has fair eye contact and is oriented x3.  Patient does not appear to be responding to internal stimuli now.  He does not evidence any delusional thought process.  Patient has outpatient care through [facility] outpatient.     Chief Complaint:  Chief Complaint  Patient presents with  . Suicide Attempt   Visit Diagnosis: Schizophrenia   CCA Screening, Triage and Referral (STR)  Patient Reported Information How did you hear about [place]? Other (Comment) (Pt called EMS after he took 10 trazadone.)  Referral name: No data recorded Referral phone number: No data recorded  Whom do you see for routine medical problems? Primary Care  Practice/Facility Name: [facility]  Practice/Facility Phone Number: No data recorded Name of Contact: No data recorded Contact Number: No data recorded Contact Fax Number: No data recorded Prescriber Name: No data recorded Prescriber Address (if known): No data recorded  What Is the Reason for Your Visit/Call Today? Patient took 10 trazadone around 16:00 on 04/05.  He did this in an effort to kill himself.  Patient has had previous suicide attempt.  Patient denies any HI.  Patient hears voices that tell him negative things.  Patient has no visual hallucinations.  How Long Has This Been Causing You Problems? 1 wk - 1 month  What Do You Feel Would Help You the Most Today? Treatment for Depression or other mood problem   Have You Recently Been in Any Inpatient Treatment (Hospital/Detox/Crisis Center/28-Day Program)? No  Name/Location of Program/Hospital:No data recorded How Long Were You There? No data recorded When Were You Discharged? No data recorded  Have You Ever Received Services From [facility]  Before? Yes  Who Do You See at [facility]? Ellie Lunch, NP at [facility] outpatient.   Have You Recently Had Any Thoughts About Hurting Yourself? Yes  Are You Planning to Commit Suicide/Harm  Yourself At This time? Yes   Have you Recently Had Thoughts About Hurting Someone Karolee Ohs? No  Explanation: No data recorded  Have You Used Any Alcohol or Drugs in the Past 24 Hours? No  How Long Ago Did You Use Drugs or Alcohol? No data recorded What Did You Use and How Much? No data recorded  Do You Currently Have a Therapist/Psychiatrist? Yes  Name of Therapist/Psychiatrist: Ellie Lunch, NP at [facility] outpatient   Have You Been Recently Discharged From Any Office Practice or Programs? No  Explanation of Discharge From Practice/Program: No data recorded    CCA Screening Triage Referral Assessment Type of Contact: Tele-Assessment  Is this Initial or Reassessment? Initial Assessment  Date Telepsych consult ordered in [facility]:  12/06/2020  Time Telepsych consult ordered in [facility]:  1859   Patient Reported Information Reviewed? No data recorded Patient Left Without Being Seen? No data recorded Reason for Not Completing Assessment: No data recorded  Collateral Involvement: No data recorded  Does Patient Have a Court Appointed Legal Guardian? No data recorded Name and Contact of Legal Guardian: No data recorded If Minor and Not Living with Parent(s), Who has Custody? No data recorded Is CPS involved or ever been involved? No data recorded Is APS involved or ever been involved? Never   Patient Determined To Be At Risk for Harm To Self or Others Based on Review of Patient Reported Information or Presenting Complaint? Yes, for Self-Harm  Method: No data recorded Availability of Means: No data recorded Intent: No data recorded Notification Required: No data recorded Additional Information for Danger to Others Potential: No data recorded Additional Comments for Danger to Others Potential: No data recorded Are There Guns or Other Weapons in Your Home? No data recorded Types of Guns/Weapons: No data recorded Are These Weapons Safely Secured?                            No data  recorded Who Could Verify You Are Able To Have These Secured: No data recorded Do You Have any Outstanding Charges, Pending Court Dates, Parole/Probation? No data recorded Contacted To Inform of Risk of Harm To Self or Others: Other: Comment (Pt was by himself when he attempted to overdose.)   Location of Assessment: [facility]   Does Patient Present under Involuntary Commitment? No  IVC Papers Initial File Date: No data recorded  [place] of Residence: [place]   Patient Currently Receiving the Following Services: Medication Management   Determination of Need: Emergent (2 hours)   Options For Referral: Inpatient Hospitalization     CCA Biopsychosocial Intake/Chief Complaint:  Patient was by himself when he intentionally took 10 tarzadone to kill himself.  He called EMS right after the ingestion.  He has had one previous suicide attempt.  He said he thinks he did it because he has been hearing voices telling him to harm himself.  Patient admits to depression but cannot pinpoint any other things that may have lead to him attempting suicide.  Pt denies any Hi.  He hears voices telling him negative things.  Pt says that he had bad nightmares from one of the medications he was taking.  He is no longer taking that medication.  Current Symptoms/Problems: Suicide  attempt by overdose.   Patient Reported Schizophrenia/Schizoaffective Diagnosis in Past: No data recorded  Strengths: No data recorded Preferences: No data recorded Abilities: No data recorded  Type of Services Patient Feels are Needed: No data recorded  Initial Clinical Notes/Concerns: No data recorded  Mental Health Symptoms Depression:  Hopelessness; Worthlessness; Difficulty Concentrating   Duration of Depressive symptoms: No data recorded  Mania:  No data recorded  Anxiety:   Difficulty concentrating; Tension; Worrying   Psychosis:  Hallucinations   Duration of Psychotic symptoms: Greater than six months    Trauma:  None   Obsessions:  None   Compulsions:  None   Inattention:  None   Hyperactivity/Impulsivity:  N/A   Oppositional/Defiant Behaviors:  None   Emotional Irregularity:  None   Other Mood/Personality Symptoms:  No data recorded   Mental Status Exam Appearance and self-care  Stature:  No data recorded  Weight:  No data recorded  Clothing:  No data recorded  Grooming:  No data recorded  Cosmetic use:  No data recorded  Posture/gait:  No data recorded  Motor activity:  Not Remarkable   Sensorium  Attention:  Distractible   Concentration:  Normal   Orientation:  X5   Recall/memory:  Normal   Affect and Mood  Affect:  Congruent   Mood:  Depressed   Relating  Eye contact:  Normal   Facial expression:  Depressed   Attitude toward examiner:  Cooperative   Thought and Language  Speech flow: Clear and Coherent   Thought content:  Appropriate to Mood and Circumstances   Preoccupation:  None   Hallucinations:  Auditory   Organization:  No data recorded  [employer] of Knowledge:  Good   Intelligence:  Average   Abstraction:  Concrete   Judgement:  Poor   Reality Testing:  Realistic   Insight:  Fair   Decision Making:  Impulsive   Social Functioning  Social Maturity:  Isolates   Social Judgement:  Normal   Stress  Stressors:  Other (Comment) (Pt cannot identiy.)   Coping Ability:  Overwhelmed   Skill Deficits:  Decision making   Supports:  Family     Religion:    Leisure/Recreation:    Exercise/Diet: Exercise/Diet Do You Have Any Trouble Sleeping?: No   CCA Employment/Education Employment/Work Situation: Employment / Work Situation Employment situation: Unemployed What is the longest time patient has a held a job?: Pt has not been able to consistently maintain employment Has patient ever been in the [employer]?: No  Education: Education Did Garment/textile technologist From [employer]?:  (Pt has his GED.)   CCA  Family/Childhood History Family and Relationship History: Family history Marital status: Single Does patient have children?: Yes How many children?: 1 How is patient's relationship with their children?: His 8 years old son lives with patient's mom.  Childhood History:  Childhood History By whom was/is the patient raised?: Both parents Does patient have siblings?: Yes Number of Siblings: 1 Description of patient's current relationship with siblings: Pt says that his sister lives in [place].  He talks to her on the phone. Did patient suffer any verbal/emotional/physical/sexual abuse as a child?: No Did patient suffer from severe childhood neglect?: No Has patient ever been sexually abused/assaulted/raped as an adolescent or adult?: No Was the patient ever a victim of a crime or a disaster?: No Witnessed domestic violence?: No Has patient been affected by domestic violence as an adult?: No  Child/Adolescent Assessment:     CCA Substance  Use Alcohol/Drug Use: Alcohol / Drug Use Pain Medications: See PTA medication list Prescriptions: See PTA medication list Over the Counter: None History of alcohol / drug use?: Yes (Pt has been clean for 3 years.)                         ASAM's:  Six Dimensions of Multidimensional Assessment  Dimension 1:  Acute Intoxication and/or Withdrawal Potential:      Dimension 2:  Biomedical Conditions and Complications:      Dimension 3:  Emotional, Behavioral, or Cognitive Conditions and Complications:     Dimension 4:  Readiness to Change:     Dimension 5:  Relapse, Continued use, or Continued Problem Potential:     Dimension 6:  Recovery/Living Environment:     ASAM Severity Score:    ASAM Recommended Level of Treatment:     Substance use Disorder (SUD)    Recommendations for Services/Supports/Treatments:    DSM5 Diagnoses: Patient Active Problem List   Diagnosis Date Noted  . Suicidal thoughts   . Schizophrenia (HCC)  06/26/2018  . Depression   . Polysubstance abuse (HCC)   . Substance induced mood disorder (HCC) 09/25/2015  . Alcohol dependence with uncomplicated withdrawal (HCC) 09/16/2015  . Depressive disorder 09/12/2015  . History of schizophrenia   . Schizoaffective disorder (HCC) 12/18/2014    Patient Centered Plan: Patient is on the following Treatment Plan(s):  Depression   Referrals to Alternative Service(s): Referred to Alternative Service(s):   Place:   Date:   Time:    Referred to Alternative Service(s):   Place:   Date:   Time:    Referred to Alternative Service(s):   Place:   Date:   Time:    Referred to Alternative Service(s):   Place:   Date:   Time:     Wandra MannanHarvey, Mashanda Ishibashi Ray, LCAS

## 2020-12-07 NOTE — ED Provider Notes (Signed)
30 year old male here after an overdose last evening.  He has been medically cleared.  Psychiatry is recommending inpatient.  He is currently voluntary but now is threatening to leave.  I reviewed psychiatry notes and they feel he is a danger to himself.  I have placed an IVC on the patient.   Terrilee Files, MD 12/08/20 1150

## 2020-12-07 NOTE — ED Notes (Signed)
Pt on video with TTS.

## 2020-12-07 NOTE — ED Notes (Signed)
Spoke with Dennie Bible, [employer], and updated her on patient status. She has cleared him at this time and is closing out his case. Pt no longer needs cardiac monitoring.

## 2020-12-07 NOTE — ED Notes (Signed)
c-com called at this time Marc Hart

## 2020-12-07 NOTE — ED Notes (Signed)
Pt swabbed for covid, per secure chat with Dennison Mascot and Ava Geralyn Flash, pt to receive a bed, requested that note with bed placement information be documented, covid test has been obtained and is processing in lab at this time.

## 2020-12-07 NOTE — BH Assessment (Signed)
Patient accepted to [facility] after 9pm. The accepting provider is Otila Back, NP. The attending provider is Dr. Jola Babinski. Room assignment is 405-1. [facility] nursin to arrange [facility] transport to [facility]. Charge nurse Alinda Money, RN) provided disposition updates.

## 2020-12-08 ENCOUNTER — Encounter (HOSPITAL_COMMUNITY): Payer: Self-pay | Admitting: Physician Assistant

## 2020-12-08 ENCOUNTER — Telehealth (HOSPITAL_COMMUNITY): Payer: Self-pay | Admitting: *Deleted

## 2020-12-08 ENCOUNTER — Other Ambulatory Visit: Payer: Self-pay

## 2020-12-08 DIAGNOSIS — F201 Disorganized schizophrenia: Secondary | ICD-10-CM

## 2020-12-08 LAB — LIPID PANEL
Cholesterol: 112 mg/dL (ref 0–200)
HDL: 27 mg/dL — ABNORMAL LOW (ref >40)
LDL Cholesterol: 66 mg/dL (ref 0–99)
Total CHOL/HDL Ratio: 4.1 RATIO
Triglycerides: 97 mg/dL (ref <150)
VLDL: 19 mg/dL (ref 0–40)

## 2020-12-08 LAB — TSH: TSH: 1.449 u[IU]/mL (ref 0.350–4.500)

## 2020-12-08 LAB — HEMOGLOBIN A1C
Hgb A1c MFr Bld: 5.1 % (ref 4.8–5.6)
Mean Plasma Glucose: 99.67 mg/dL

## 2020-12-08 LAB — HEPATITIS PANEL, ACUTE
HCV Ab: REACTIVE — AB
Hep A IgM: NONREACTIVE
Hep B C IgM: NONREACTIVE
Hepatitis B Surface Ag: NONREACTIVE

## 2020-12-08 MED ORDER — FOLIC ACID 1 MG PO TABS
1.0000 mg | ORAL_TABLET | Freq: Every day | ORAL | Status: DC
  Administered 2020-12-08 – 2020-12-12 (×5): 1 mg via ORAL
  Filled 2020-12-08 (×7): qty 1

## 2020-12-08 MED ORDER — RISPERIDONE 2 MG PO TBDP
2.0000 mg | ORAL_TABLET | Freq: Two times a day (BID) | ORAL | Status: DC
  Administered 2020-12-08 – 2020-12-12 (×9): 2 mg via ORAL
  Filled 2020-12-08 (×5): qty 1
  Filled 2020-12-08: qty 14
  Filled 2020-12-08 (×4): qty 1
  Filled 2020-12-08: qty 14
  Filled 2020-12-08 (×3): qty 1

## 2020-12-08 MED ORDER — ALUM & MAG HYDROXIDE-SIMETH 200-200-20 MG/5ML PO SUSP: 30.0000 mL | ORAL | Status: DC | PRN

## 2020-12-08 MED ORDER — TRAZODONE HCL 50 MG PO TABS
50.0000 mg | ORAL_TABLET | Freq: Every evening | ORAL | Status: DC | PRN
  Filled 2020-12-08: qty 1
  Filled 2020-12-08: qty 7

## 2020-12-08 MED ORDER — MAGNESIUM HYDROXIDE 400 MG/5ML PO SUSP: 30.0000 mL | Freq: Every day | ORAL | Status: DC | PRN

## 2020-12-08 MED ORDER — MELATONIN 5 MG PO TABS
10.0000 mg | ORAL_TABLET | Freq: Once | ORAL | Status: AC
  Administered 2020-12-08: 10 mg via ORAL
  Filled 2020-12-08 (×2): qty 2

## 2020-12-08 MED ORDER — HYDROXYZINE HCL 25 MG PO TABS
25.0000 mg | ORAL_TABLET | Freq: Three times a day (TID) | ORAL | Status: DC | PRN
Start: 2020-12-08 — End: 2020-12-12
  Administered 2020-12-08 – 2020-12-12 (×5): 25 mg via ORAL
  Filled 2020-12-08 (×3): qty 1
  Filled 2020-12-08: qty 10
  Filled 2020-12-08 (×2): qty 1

## 2020-12-08 MED ORDER — THIAMINE HCL 100 MG PO TABS
100.0000 mg | ORAL_TABLET | Freq: Every day | ORAL | Status: DC
  Administered 2020-12-08 – 2020-12-12 (×5): 100 mg via ORAL
  Filled 2020-12-08 (×7): qty 1

## 2020-12-08 MED ORDER — LORAZEPAM 1 MG PO TABS: 1.0000 mg | ORAL_TABLET | Freq: Four times a day (QID) | ORAL | Status: DC | PRN

## 2020-12-08 MED ORDER — SERTRALINE HCL 50 MG PO TABS
50.0000 mg | ORAL_TABLET | Freq: Every day | ORAL | Status: DC
  Administered 2020-12-08 – 2020-12-12 (×5): 50 mg via ORAL
  Filled 2020-12-08 (×7): qty 1
  Filled 2020-12-08: qty 7

## 2020-12-08 NOTE — Progress Notes (Signed)
D: Patient presents with sad and depressed affect. Patient reports that his mood improved a little but still feels sad. Patient is positive for passive SI but verbally contracts for safety. Patient denies HI/AH/VH at this time.  A: Provided positive reinforcement and encouragement.  R: Patient cooperative and receptive to efforts. Patient remains safe on the unit.   12/08/20 2106  Psych Admission Type (Psych Patients Only)  Admission Status Involuntary  Psychosocial Assessment  Patient Complaints Depression;Sadness  Eye Contact Fair  Facial Expression Sullen;Sad  Affect Depressed;Sad;Sullen  Speech Logical/coherent  Interaction Assertive  Motor Activity Other (Comment) (WDL)  Appearance/Hygiene In scrubs  Behavior Characteristics Cooperative;Appropriate to situation  Mood Depressed;Sad  Thought Process  Coherency WDL  Content WDL  Delusions None reported or observed  Perception WDL  Hallucination None reported or observed  Judgment WDL  Confusion None  Danger to Self  Current suicidal ideation? Passive  Self-Injurious Behavior No self-injurious ideation or behavior indicators observed or expressed   Agreement Not to Harm Self Yes  Description of Agreement Verbal contract  Danger to Others  Danger to Others None reported or observed
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
## 2020-12-08 NOTE — H&P (Signed)
Psychiatric Admission Assessment Adult  Patient Identification: Marc Hart MRN:  [id number] Date of Evaluation:  12/08/2020 Chief Complaint:  Schizophrenia (HCC) [F20.9] Principal Diagnosis: <principal problem not specified> Diagnosis:  Active Problems:   Schizophrenia (HCC)  History of Present Illness: Patient is seen and examined.  Patient is a 30 year old male with a reported past psychiatric history significant for unspecified schizophrenia who presented to the [facility] emergency department on 12/06/2020 after he reported that he had taken 10 trazodone tablets in a suicide attempt.  After he took the trazodone he called 911, and stated he threw up in the EMS vehicle on its way to the hospital.  Apparently he visualized the tablets.  He was evaluated and decision was made to admit him to the hospital for evaluation and stabilization.  He stated its been very difficult to be able to make appointments at the behavioral health urgent care center.  He was referred there and has been followed by nurse practitioner there.  He stated he had been off his antidepressant medications for about a month, and had been off all other medicines for a while because he was unable to get refills and to make follow-up appointments.  He stated that he was having auditory hallucinations and that they were telling him to kill himself.  He stated he is depressed and feels helpless, hopeless and worthless.  He stated he had had side effects from some medication in the past, but it stopped taking that.  He stated it caused him to have nightmares.  He was last seen by his nurse practitioner on 11/28/2020.  This was via video visit.  During that visit he admitted that he had been noncompliant with Zoloft..  He stated he had only been back on the Zoloft for a couple of days.  He had also just recently received his paliperidone long-acting injectable medication 156 mg approximately that day.  He complained of poor sleep at that  time and his trazodone was increased to 50 mg p.o. nightly.  He did admit to the nurse practitioner that he had relapsed on methamphetamines last month, but that he had been sober from methamphetamines over the last month and his sleep had improved.  He admitted to financial stressors.  He stated that he lives independently right now, and works to help pay the rent, but his mother has been helping him for a while, but since she now takes care of his son she is unable to provide financial support for him.  He suspects that he will most likely be "put out".  His last hospitalization at our facility was in 2019.  He was diagnosed with schizophrenia as well as polysubstance abuse.  He was discharged on the paliperidone long-acting injectable medication at that time.  Associated Signs/Symptoms: Depression Symptoms:  depressed mood, anhedonia, insomnia, psychomotor agitation, suicidal thoughts without plan, anxiety, disturbed sleep, Duration of Depression Symptoms: No data recorded (Hypo) Manic Symptoms:  Delusions, Hallucinations, Impulsivity, Irritable Mood, Labiality of Mood, Anxiety Symptoms:  Excessive Worry, Psychotic Symptoms:  Delusions, Hallucinations: Auditory Paranoia, PTSD Symptoms: Negative Total Time spent with patient: 30 minutes  Past Psychiatric History: He has a history of schizophrenia has been followed in the [facility] behavioral health clinic for at least 2021.  He stated he would like to be seen in a clinic closer to [place], [place].  He has been on stable medications in the past.  He does have a substance abuse history including alcohol, methamphetamines and marijuana.  He  stated he is only had 3 psychiatric hospitalizations in the past.  Is the patient at risk to self? No.  Has the patient been a risk to self in the past 6 months? Yes.    Has the patient been a risk to self within the distant past? No.  Is the patient a risk to others? No.  Has the  patient been a risk to others in the past 6 months? No.  Has the patient been a risk to others within the distant past? No.   Prior Inpatient Therapy:   Prior Outpatient Therapy:    Alcohol Screening: 1. How often do you have a drink containing alcohol?: Monthly or less 2. How many drinks containing alcohol do you have on a typical day when you are drinking?: 1 or 2 3. How often do you have six or more drinks on one occasion?: Monthly AUDIT-C Score: 3 4. How often during the last year have you found that you were not able to stop drinking once you had started?: Never 5. How often during the last year have you failed to do what was normally expected from you because of drinking?: Never 6. How often during the last year have you needed a first drink in the morning to get yourself going after a heavy drinking session?: Never 7. How often during the last year have you had a feeling of guilt of remorse after drinking?: Never 8. How often during the last year have you been unable to remember what happened the night before because you had been drinking?: Never 9. Have you or someone else been injured as a result of your drinking?: No 10. Has a relative or friend or a doctor or another health worker been concerned about your drinking or suggested you cut down?: No Alcohol Use Disorder Identification Test Final Score (AUDIT): 3 Substance Abuse History in the last 12 months:  Yes.   Consequences of Substance Abuse: Medical Consequences:  Could have easily contributed to his psychotic symptoms. Previous Psychotropic Medications: Yes  Psychological Evaluations: Yes  Past Medical History:  Past Medical History:  Diagnosis Date  . Schizophrenia (HCC)   . Substance abuse [facility])     Past Surgical History:  Procedure Laterality Date  . OTHER SURGICAL HISTORY     Ear Surgery    Family History: History reviewed. No pertinent family history. Family Psychiatric  History: Father reportedly had bipolar  disorder Tobacco Screening: Have you used any form of tobacco in the last 30 days? (Cigarettes, Smokeless Tobacco, Cigars, and/or Pipes): Yes Tobacco use, Select all that apply: 4 or less cigarettes per day Are you interested in Tobacco Cessation Medications?: No, patient refused Counseled patient on smoking cessation including recognizing danger situations, developing coping skills and basic information about quitting provided: Yes Social History:  Social History   Substance and Sexual Activity  Alcohol Use Yes   Comment: 6-9 tallboy beers/day     Social History   Substance and Sexual Activity  Drug Use Yes  . Types: Marijuana, IV, Cocaine, Methamphetamines   Comment: heroin 1gram/day    Additional Social History:                           Allergies:   Allergies  Allergen Reactions  . Amoxicillin Anaphylaxis    Has patient had a PCN reaction causing immediate rash, facial/tongue/throat swelling, SOB or lightheadedness with hypotension: Yes Has patient had a PCN reaction causing severe rash involving  mucus membranes or skin necrosis: No Has patient had a PCN reaction that required hospitalization No Has patient had a PCN reaction occurring within the last 10 years: Yes If all of the above answers are "NO", then may proceed with Cephalosporin use.  . Other Other (See Comments)    coppertone suntan lotion caused rash  . Peanut-Containing Drug Products Other (See Comments)    Upset stomach  . Tylenol [Acetaminophen] Itching    Knees itched  . Other Rash    Allergic reaction to coppertone suntan lotion  . Tylenol [Acetaminophen] Itching    "It makes my knees itchy"    Lab Results:  Results for orders placed or performed during the hospital encounter of 12/07/20 (from the past 48 hour(s))  Hemoglobin A1c     Status: None   Collection Time: 12/08/20  6:08 AM  Result Value Ref Range   Hgb A1c MFr Bld 5.1 4.8 - 5.6 %    Comment: (NOTE) Pre diabetes:           5.7%-6.4%  Diabetes:              >6.4%  Glycemic control for   <7.0% adults with diabetes    Mean Plasma Glucose 99.67 mg/dL    Comment: Performed at [facility], [address], [place], [place] [address]  Lipid panel     Status: Abnormal   Collection Time: 12/08/20  6:08 AM  Result Value Ref Range   Cholesterol 112 0 - 200 mg/dL   Triglycerides 97 <604 mg/dL   HDL 27 (L) >54 mg/dL   Total CHOL/HDL Ratio 4.1 RATIO   VLDL 19 0 - 40 mg/dL   LDL Cholesterol 66 0 - 99 mg/dL    Comment:        Total Cholesterol/HDL:CHD Risk Coronary Heart Disease Risk Table                     Men   Women  1/2 Average Risk   3.4   3.3  Average Risk       5.0   4.4  2 X Average Risk   9.6   7.1  3 X Average Risk  23.4   11.0        Use the calculated Patient Ratio above and the CHD Risk Table to determine the patient's CHD Risk.        ATP III CLASSIFICATION (LDL):  <100     mg/dL   Optimal  098-119  mg/dL   Near or Above                    Optimal  130-159  mg/dL   Borderline  147-829  mg/dL   High  >562     mg/dL   Very High Performed at [facility], [address], [place], [place] [address]   TSH     Status: None   Collection Time: 12/08/20  6:08 AM  Result Value Ref Range   TSH 1.449 0.350 - 4.500 uIU/mL    Comment: Performed by a 3rd Generation assay with a functional sensitivity of <=0.01 uIU/mL. Performed at [facility], [address], [place], [place] [address]   Hepatitis panel, acute     Status: None (Preliminary result)   Collection Time: 12/08/20  6:08 AM  Result Value Ref Range   Hepatitis B Surface Ag NON REACTIVE NON REACTIVE    Comment: Performed at [facility], [address], [place], [place] [address]   HCV Ab PENDING NON REACTIVE   Hep A IgM PENDING NON REACTIVE   Hep B C IgM PENDING NON REACTIVE    Blood Alcohol level:  Lab Results  Component Value Date   ETH <10 12/06/2020   ETH 213 (H) 07/27/2018     Metabolic Disorder Labs:  Lab Results  Component Value Date   HGBA1C 5.1 12/08/2020   MPG 99.67 12/08/2020   No results found for: PROLACTIN Lab Results  Component Value Date   CHOL 112 12/08/2020   TRIG 97 12/08/2020   HDL 27 (L) 12/08/2020   CHOLHDL 4.1 12/08/2020   VLDL 19 12/08/2020   LDLCALC 66 12/08/2020    Current Medications: Current Facility-Administered Medications  Medication Dose Route Frequency Provider Last Rate Last Admin  . alum & mag hydroxide-simeth (MAALOX/MYLANTA) 200-200-20 MG/5ML suspension 30 mL  30 mL Oral Q4H PRN Nwoko, Uchenna E, PA      . folic acid (FOLVITE) tablet 1 mg  1 mg Oral Daily Antonieta Pert, MD   1 mg at 12/08/20 4315  . hydrOXYzine (ATARAX/VISTARIL) tablet 25 mg  25 mg Oral TID PRN Meta Hatchet, PA      . LORazepam (ATIVAN) tablet 1 mg  1 mg Oral Q6H PRN Antonieta Pert, MD      . magnesium hydroxide (MILK OF MAGNESIA) suspension 30 mL  30 mL Oral Daily PRN Nwoko, Uchenna E, PA      . risperiDONE (RISPERDAL M-TABS) disintegrating tablet 2 mg  2 mg Oral BID Antonieta Pert, MD   2 mg at 12/08/20 4008  . thiamine tablet 100 mg  100 mg Oral Daily Antonieta Pert, MD   100 mg at 12/08/20 6761  . traZODone (DESYREL) tablet 50 mg  50 mg Oral QHS PRN Nwoko, Uchenna E, PA       PTA Medications: Facility-Administered Medications Prior to Admission  Medication Dose Route Frequency Provider Last Rate Last Admin  . paliperidone (INVEGA SUSTENNA) injection 156 mg  156 mg Intramuscular Q28 days Toy Cookey E, NP   156 mg at 11/16/20 1602   Medications Prior to Admission  Medication Sig Dispense Refill Last Dose  . paliperidone (INVEGA SUSTENNA) 156 MG/ML SUSY injection Inject 1 mL (156 mg total) into the muscle every 28 (twenty-eight) days. Last administration 07/02/18. 1.2 mL 9   . sertraline (ZOLOFT) 50 MG tablet Take 1 tablet (50 mg total) by mouth daily. 30 tablet 2   . traZODone (DESYREL) 50 MG tablet Take 1 tablet (50  mg total) by mouth at bedtime as needed for sleep. 30 tablet 2     Musculoskeletal: Strength & Muscle Tone: within normal limits Gait & Station: normal Patient leans: N/A            Psychiatric Specialty Exam:  Presentation  General Appearance: Disheveled  Eye Contact:Fair  Speech:Normal Rate  Speech Volume:Decreased  Handedness:Right   Mood and Affect  Mood:Depressed  Affect:Congruent   Thought Process  Thought Processes:Goal Directed  Duration of Psychotic Symptoms: Greater than six months  Past Diagnosis of Schizophrenia or Psychoactive disorder: No data recorded Descriptions of Associations:Intact  Orientation:Full (Time, Place and Person)  Thought Content:Logical  Hallucinations:Hallucinations: Auditory  Ideas of Reference:None  Suicidal Thoughts:Suicidal Thoughts: Yes, Active SI Active Intent and/or Plan: With Intent  Homicidal Thoughts:Homicidal Thoughts: No   Sensorium  Memory:Immediate Fair; Recent Fair; Remote Fair  Judgment:Poor  Insight:Poor   Executive  Functions  Concentration:Fair  Attention Span:Fair  Recall:Fair  Fund of Knowledge:Fair  Language:Fair   Psychomotor Activity  Psychomotor Activity:Psychomotor Activity: Decreased   Assets  Assets:Desire for Improvement; Resilience   Sleep  Sleep:Sleep: Poor    Physical Exam: Physical Exam Vitals and nursing note reviewed.  Constitutional:      Appearance: Normal appearance.  HENT:     Head: Normocephalic and atraumatic.  Pulmonary:     Effort: Pulmonary effort is normal.  Neurological:     General: No focal deficit present.     Mental Status: He is alert and oriented to person, place, and time.    ROS Blood pressure 109/62, pulse 82, temperature 98.5 F (36.9 C), temperature source Oral, resp. rate 17, height 5\' 11"  (1.803 m), weight 113.4 kg, SpO2 98 %. Body mass index is 34.87 kg/m.  Treatment Plan Summary: Daily contact with patient to  assess and evaluate symptoms and progress in treatment, Medication management and Plan : Patient is seen and examined.  Patient is a 30 year old male with the above-stated past psychiatric history who was admitted with suicidal ideation, worsening depression and an intentional overdose of trazodone.  He will be admitted to the hospital.  He will be integrated in the milieu.  He will be encouraged to attend groups.  Unfortunately the admit orders did not include any of his current medications.  He was restarted on his Zoloft 50 mg p.o. daily on approximately 3/28.  He had also been prescribed trazodone 50 mg p.o. nightly, but given the fact that he overdosed on that I think we will hold on giving him that back.  He recently received the long-acting paliperidone injection 156 mg, but because the presence of the auditory hallucinations I will also put on board Risperdal 2 mg p.o. twice daily.  We will contact family for collateral information with regard to his financial stressors and housing situation.  He also wants to be referred to a clinic that is closer to medicine so he does not have difficulty getting back and forth.  Review of his admission laboratories revealed a normal creatinine and normal electrolytes.  His liver function enzymes were significantly elevated with an AST of 165 and an ALT of 299.  Review of the electronic medical record revealed that these enzymes have been elevated for at least 2 years.  It is unclear whether or not a work-up of this is been done.  Much of his elevation has been attributed to his alcohol abuse.  He told me today that he had not been abusing alcohol, and his blood alcohol was less than 10 on admission.  We will go on and order hepatitis panel, and most likely get a liver ultrasound to make sure that there is no other process going on.  His lipid panel was normal.  CBC was normal including his MCV.  Platelets were 214,000.  Acetaminophen was less than 10, salicylate less than  7.  TSH was normal at 1.449.  Respiratory panel was negative for influenza A, B and coronavirus.  Blood alcohol was less than 10.  Drug screen was positive for marijuana.  EKG was of poor quality and we will repeat that.  His QTc interval on that EKG was within normal limits.  His vital signs are stable, he is afebrile.  Because of his elevated liver function enzymes, his history of alcohol issues I am going to place him on a lorazepam 1 mg p.o. every 6 hours as needed a CIWA greater than  10 to make sure about the potential for any withdrawal syndromes.  Observation Level/Precautions:  15 minute checks  Laboratory:  Chemistry Profile  Psychotherapy:    Medications:    Consultations:    Discharge Concerns:    Estimated LOS:  Other:     Physician Treatment Plan for Primary Diagnosis: <principal problem not specified> Long Term Goal(s): Improvement in symptoms so as ready for discharge  Short Term Goals: Ability to identify changes in lifestyle to reduce recurrence of condition will improve, Ability to verbalize feelings will improve, Ability to disclose and discuss suicidal ideas, Ability to demonstrate self-control will improve, Ability to identify and develop effective coping behaviors will improve, Ability to maintain clinical measurements within normal limits will improve, Compliance with prescribed medications will improve and Ability to identify triggers associated with substance abuse/mental health issues will improve  Physician Treatment Plan for Secondary Diagnosis: Active Problems:   Schizophrenia (HCC)  Long Term Goal(s): Improvement in symptoms so as ready for discharge  Short Term Goals: Ability to identify changes in lifestyle to reduce recurrence of condition will improve, Ability to verbalize feelings will improve, Ability to disclose and discuss suicidal ideas, Ability to demonstrate self-control will improve, Ability to identify and develop effective coping behaviors will  improve, Ability to maintain clinical measurements within normal limits will improve, Compliance with prescribed medications will improve and Ability to identify triggers associated with substance abuse/mental health issues will improve  I certify that inpatient services furnished can reasonably be expected to improve the patient's condition.    Antonieta Pert, MD 4/7/20221:04 PM

## 2020-12-08 NOTE — Progress Notes (Signed)
The patient attended the evening group on the 300 hall and was appropriate.

## 2020-12-08 NOTE — Tx Team (Signed)
Initial Treatment Plan 12/08/2020 12:50 AM Marc Hart EBR:[id number]    PATIENT STRESSORS: Financial difficulties Marital or family conflict Occupational concerns   PATIENT STRENGTHS: Ability for insight Active sense of humor Motivation for treatment/growth Supportive family/friends   PATIENT IDENTIFIED PROBLEMS: Suicidal ideation  depression  (positivity and open mindedness)                 DISCHARGE CRITERIA:  Improved stabilization in mood, thinking, and/or behavior Motivation to continue treatment in a less acute level of care Verbal commitment to aftercare and medication compliance  PRELIMINARY DISCHARGE PLAN: Outpatient therapy Return to previous living arrangement  PATIENT/FAMILY INVOLVEMENT: This treatment plan has been presented to and reviewed with the patient, Marc Hart, and/or family member.  The patient and family have been given the opportunity to ask questions and make suggestions.  Mancel Bale, RN 12/08/2020, 12:50 AM

## 2020-12-08 NOTE — Telephone Encounter (Signed)
Writer called [facility] to follow up with the referral I did for him at the start of this week. Told by Cassie at [facility] she had sent out to him paperwork for him to complete re starting involement with them at [facility].

## 2020-12-08 NOTE — B[facility] Counselor (Signed)
CSW provided the patient with a resource packet that contains information about: Disability insurance, housing and shelters, free and reduce price food, [employer] cards, the IRC, and suicide prevention.  CSW will follow up with the patient to see if these resources were helpful.  

## 2020-12-08 NOTE — BHH Suicide Risk Assessment (Signed)
[facility] Admission Suicide Risk Assessment   Nursing information obtained from:  Patient Demographic factors:  Male,Low socioeconomic status,Living alone,Unemployed Current Mental Status:  Suicidal ideation indicated by patient Loss Factors:  Loss of significant relationship Historical Factors:  Family history of mental illness or substance abuse,Impulsivity Risk Reduction Factors:  Positive social support,Positive therapeutic relationship  Total Time spent with patient: 30 minutes Principal Problem: <principal problem not specified> Diagnosis:  Active Problems:   Schizophrenia (HCC)  Subjective Data: Patient is seen and examined.  Patient is a 30 year old male with a reported past psychiatric history significant for unspecified schizophrenia who presented to the [facility] emergency department on 12/06/2020 after he reported that he had taken 10 trazodone tablets in a suicide attempt.  After he took the trazodone he called 911, and stated he threw up in the EMS vehicle on its way to the hospital.  Apparently he visualized the tablets.  He was evaluated and decision was made to admit him to the hospital for evaluation and stabilization.  He stated its been very difficult to be able to make appointments at the behavioral health urgent care center.  He was referred there and has been followed by nurse practitioner there.  He stated he had been off his antidepressant medications for about a month, and had been off all other medicines for a while because he was unable to get refills and to make follow-up appointments.  He stated that he was having auditory hallucinations and that they were telling him to kill himself.  He stated he is depressed and feels helpless, hopeless and worthless.  He stated he had had side effects from some medication in the past, but it stopped taking that.  He stated it caused him to have nightmares.  He was last seen by his nurse practitioner on 11/28/2020.  This was via video visit.   During that visit he admitted that he had been noncompliant with Zoloft..  He stated he had only been back on the Zoloft for a couple of days.  He had also just recently received his paliperidone long-acting injectable medication 156 mg approximately that day.  He complained of poor sleep at that time and his trazodone was increased to 50 mg p.o. nightly.  He did admit to the nurse practitioner that he had relapsed on methamphetamines last month, but that he had been sober from methamphetamines over the last month and his sleep had improved.  He admitted to financial stressors.  He stated that he lives independently right now, and works to help pay the rent, but his mother has been helping him for a while, but since she now takes care of his son she is unable to provide financial support for him.  He suspects that he will most likely be "put out".  His last hospitalization at our facility was in 2019.  He was diagnosed with schizophrenia as well as polysubstance abuse.  He was discharged on the paliperidone long-acting injectable medication at that time.  Continued Clinical Symptoms:  Alcohol Use Disorder Identification Test Final Score (AUDIT): 3 The "Alcohol Use Disorders Identification Test", Guidelines for Use in Primary Care, Second Edition.  World Science writer [facility]). Score between 0-7:  no or low risk or alcohol related problems. Score between 8-15:  moderate risk of alcohol related problems. Score between 16-19:  high risk of alcohol related problems. Score 20 or above:  warrants further diagnostic evaluation for alcohol dependence and treatment.   CLINICAL FACTORS:   Depression:  Anhedonia Hopelessness Impulsivity Insomnia Alcohol/Substance Abuse/Dependencies Schizophrenia:   Depressive state Less than 83 years old   Musculoskeletal: Strength & Muscle Tone: within normal limits Gait & Station: normal Patient leans: N/A  Psychiatric Specialty Exam:  Presentation  General  Appearance: Disheveled  Eye Contact:Fair  Speech:Normal Rate  Speech Volume:Decreased  Handedness:Right   Mood and Affect  Mood:Depressed  Affect:Congruent   Thought Process  Thought Processes:Goal Directed  Descriptions of Associations:Intact  Orientation:Full (Time, Place and Person)  Thought Content:Logical  History of Schizophrenia/Schizoaffective disorder:No data recorded Duration of Psychotic Symptoms:Greater than six months  Hallucinations:Hallucinations: Auditory  Ideas of Reference:None  Suicidal Thoughts:Suicidal Thoughts: Yes, Active SI Active Intent and/or Plan: With Intent  Homicidal Thoughts:Homicidal Thoughts: No   Sensorium  Memory:Immediate Fair; Recent Fair; Remote Fair  Judgment:Poor  Insight:Poor   Executive Functions  Concentration:Fair  Attention Span:Fair  Recall:Fair  Fund of Knowledge:Fair  Language:Fair   Psychomotor Activity  Psychomotor Activity:Psychomotor Activity: Decreased   Assets  Assets:Desire for Improvement; Resilience   Sleep  Sleep:Sleep: Poor    Physical Exam: Physical Exam Vitals and nursing note reviewed.  HENT:     Head: Normocephalic and atraumatic.  Pulmonary:     Effort: Pulmonary effort is normal.  Neurological:     General: No focal deficit present.     Mental Status: He is alert and oriented to person, place, and time.    ROS Blood pressure 109/62, pulse 82, temperature 98.5 F (36.9 C), temperature source Oral, resp. rate 17, height 5\' 11"  (1.803 m), weight 113.4 kg, SpO2 98 %. Body mass index is 34.87 kg/m.   COGNITIVE FEATURES THAT CONTRIBUTE TO RISK:  None    SUICIDE RISK:   Moderate:  Frequent suicidal ideation with limited intensity, and duration, some specificity in terms of plans, no associated intent, good self-control, limited dysphoria/symptomatology, some risk factors present, and identifiable protective factors, including available and accessible social  support.  PLAN OF CARE: Patient is seen and examined.  Patient is a 30 year old male with the above-stated past psychiatric history who was admitted with suicidal ideation, worsening depression and an intentional overdose of trazodone.  He will be admitted to the hospital.  He will be integrated in the milieu.  He will be encouraged to attend groups.  Unfortunately the admit orders did not include any of his current medications.  He was restarted on his Zoloft 50 mg p.o. daily on approximately 3/28.  He had also been prescribed trazodone 50 mg p.o. nightly, but given the fact that he overdosed on that I think we will hold on giving him that back.  He recently received the long-acting paliperidone injection 156 mg, but because the presence of the auditory hallucinations I will also put on board Risperdal 2 mg p.o. twice daily.  We will contact family for collateral information with regard to his financial stressors and housing situation.  He also wants to be referred to a clinic that is closer to medicine so he does not have difficulty getting back and forth.  Review of his admission laboratories revealed a normal creatinine and normal electrolytes.  His liver function enzymes were significantly elevated with an AST of 165 and an ALT of 299.  Review of the electronic medical record revealed that these enzymes have been elevated for at least 2 years.  It is unclear whether or not a work-up of this is been done.  Much of his elevation has been attributed to his alcohol abuse.  He told me today that  he had not been abusing alcohol, and his blood alcohol was less than 10 on admission.  We will go on and order hepatitis panel, and most likely get a liver ultrasound to make sure that there is no other process going on.  His lipid panel was normal.  CBC was normal including his MCV.  Platelets were 214,000.  Acetaminophen was less than 10, salicylate less than 7.  TSH was normal at 1.449.  Respiratory panel was negative  for influenza A, B and coronavirus.  Blood alcohol was less than 10.  Drug screen was positive for marijuana.  EKG was of poor quality and we will repeat that.  His QTc interval on that EKG was within normal limits.  His vital signs are stable, he is afebrile.  Because of his elevated liver function enzymes, his history of alcohol issues I am going to place him on a lorazepam 1 mg p.o. every 6 hours as needed a CIWA greater than 10 to make sure about the potential for any withdrawal syndromes.  I certify that inpatient services furnished can reasonably be expected to improve the patient's condition.   Antonieta Pert, MD 12/08/2020, 9:02 AM

## 2020-12-08 NOTE — Progress Notes (Signed)
Admission Note:  Marc Hart 12/07/20 MRN: [id number] Patient presents to [facility] from [facility] under IVC. Patient self reports a suicide attempt where he took 10 trazodone. Patient reports he attempted to call the suicide hotline and they hung up on him and this made him upset. Patient presents with sad and flat affect at time of assessment. Patient is positive for passive SI but verbally contracts for safety. Patient denies HI/AVH at time of assessment. Patient reports his stressors as life, money, and family issues.

## 2020-12-08 NOTE — Progress Notes (Signed)
   12/08/20 0615  Vital Signs  Pulse Rate 82  BP 109/62  BP Location Left Arm  BP Method Automatic  Patient Position (if appropriate) Standing   D: Patient denies SI/HI/ AVH. Patient denied anxiety, but rated depression 10/10. Pt. Isolated in his room in the morning, but came out to use the phone in the afternoon.  A:  Patient took scheduled medicine.  Support and encouragement provided Routine safety checks conducted every 15 minutes. Patient  Informed to notify staff with any concerns.   RSafety maintained.

## 2020-12-09 NOTE — Progress Notes (Signed)
Progress note    12/09/20 0756  Psych Admission Type (Psych Patients Only)  Admission Status Involuntary  Psychosocial Assessment  Patient Complaints Anxiety  Eye Contact Fair  Facial Expression Anxious;Pensive;Sullen;Sad  Affect Anxious;Depressed;Preoccupied;Sad;Sullen  Speech Logical/coherent  Interaction Assertive  Motor Activity Fidgety  Appearance/Hygiene Improved  Behavior Characteristics Cooperative;Appropriate to situation;Anxious;Fidgety  Mood Depressed;Anxious;Sad;Sullen;Pleasant  Thought Administrator, sports thinking  Content WDL  Delusions None reported or observed  Perception Hallucinations  Judgment WDL  Danger to Self  Current suicidal ideation? Denies  Self-Injurious Behavior No self-injurious ideation or behavior indicators observed or expressed   Danger to Others  Danger to Others None reported or observed

## 2020-12-09 NOTE — Progress Notes (Signed)
[facility] MD Progress Note  12/09/2020 11:48 AM Marc Hart  MRN:  [id number] Subjective: Patient is a 30 year old male with a past psychiatric history significant for unspecified schizophrenia who presented to the [facility] emergency department on 12/06/2020 after he reported that he had taken 10 trazodone tablets in a suicide attempt.  Objective: Patient is seen and examined.  Patient is a 30 year old male with the above-stated past psychiatric history who is seen in follow-up.  He stated he felt better today.  He stated he slept better last night.  He stated his mood was improved.  He denied suicidality.  He was able to talk to his mother last night, but they did not talk about the housing situation.  He stated he felt between 70 and 80% better today than he did on admission.  We discussed his lab results which showed a positive hepatitis C.  The patient admitted that he knew he had hepatitis C previously.  He denied that he had had treatment for that in the past.  We discussed treatment options.  He denied any side effects to his current medications.  No auditory or visual hallucinations.  Laboratory results from 4/7 included a normal lipid panel, hepatitis A, B were all negative, hepatitis C antibody was positive.  His vital signs are stable, he is afebrile.  He slept 6.75 hours last night.  Principal Problem: <principal problem not specified> Diagnosis: Active Problems:   Schizophrenia (HCC)  Total Time spent with patient: 20 minutes  Past Psychiatric History: See admission H&P  Past Medical History:  Past Medical History:  Diagnosis Date  . Schizophrenia (HCC)   . Substance abuse [facility])     Past Surgical History:  Procedure Laterality Date  . OTHER SURGICAL HISTORY     Ear Surgery    Family History: History reviewed. No pertinent family history. Family Psychiatric  History: See admission H&P Social History:  Social History   Substance and Sexual Activity  Alcohol Use Yes    Comment: 6-9 tallboy beers/day     Social History   Substance and Sexual Activity  Drug Use Yes  . Types: Marijuana, IV, Cocaine, Methamphetamines   Comment: heroin 1gram/day    Social History   Socioeconomic History  . Marital status: Single    Spouse name: Not on file  . Number of children: Not on file  . Years of education: Not on file  . Highest education level: Not on file  Occupational History  . Not on file  Tobacco Use  . Smoking status: Current Some Day Smoker  . Smokeless tobacco: Never Used  Vaping Use  . Vaping Use: Never used  Substance and Sexual Activity  . Alcohol use: Yes    Comment: 6-9 tallboy beers/day  . Drug use: Yes    Types: Marijuana, IV, Cocaine, Methamphetamines    Comment: heroin 1gram/day  . Sexual activity: Not on file  Other Topics Concern  . Not on file  Social History Narrative   ** Merged History Encounter **       Social Determinants of Health   Financial Resource Strain: Not on file  Food Insecurity: Not on file  Transportation Needs: Not on file  Physical Activity: Not on file  Stress: Not on file  Social Connections: Not on file   Additional Social History:                         Sleep: Good  Appetite:  Good  Current Medications: Current Facility-Administered Medications  Medication Dose Route Frequency Provider Last Rate Last Admin  . alum & mag hydroxide-simeth (MAALOX/MYLANTA) 200-200-20 MG/5ML suspension 30 mL  30 mL Oral Q4H PRN Nwoko, Uchenna E, PA      . folic acid (FOLVITE) tablet 1 mg  1 mg Oral Daily Antonieta Pert, MD   1 mg at 12/09/20 0756  . hydrOXYzine (ATARAX/VISTARIL) tablet 25 mg  25 mg Oral TID PRN Karel Jarvis E, PA   25 mg at 12/08/20 2106  . LORazepam (ATIVAN) tablet 1 mg  1 mg Oral Q6H PRN Antonieta Pert, MD      . magnesium hydroxide (MILK OF MAGNESIA) suspension 30 mL  30 mL Oral Daily PRN Nwoko, Uchenna E, PA      . risperiDONE (RISPERDAL M-TABS) disintegrating tablet 2 mg   2 mg Oral BID Antonieta Pert, MD   2 mg at 12/09/20 0756  . sertraline (ZOLOFT) tablet 50 mg  50 mg Oral Daily Antonieta Pert, MD   50 mg at 12/09/20 0757  . thiamine tablet 100 mg  100 mg Oral Daily Antonieta Pert, MD   100 mg at 12/09/20 0756  . traZODone (DESYREL) tablet 50 mg  50 mg Oral QHS PRN Nwoko, Tommas Olp, PA        Lab Results:  Results for orders placed or performed during the hospital encounter of 12/07/20 (from the past 48 hour(s))  Hemoglobin A1c     Status: None   Collection Time: 12/08/20  6:08 AM  Result Value Ref Range   Hgb A1c MFr Bld 5.1 4.8 - 5.6 %    Comment: (NOTE) Pre diabetes:          5.7%-6.4%  Diabetes:              >6.4%  Glycemic control for   <7.0% adults with diabetes    Mean Plasma Glucose 99.67 mg/dL    Comment: Performed at [facility], [address], [place], [place] [address]  Lipid panel     Status: Abnormal   Collection Time: 12/08/20  6:08 AM  Result Value Ref Range   Cholesterol 112 0 - 200 mg/dL   Triglycerides 97 <604 mg/dL   HDL 27 (L) >54 mg/dL   Total CHOL/HDL Ratio 4.1 RATIO   VLDL 19 0 - 40 mg/dL   LDL Cholesterol 66 0 - 99 mg/dL    Comment:        Total Cholesterol/HDL:CHD Risk Coronary Heart Disease Risk Table                     Men   Women  1/2 Average Risk   3.4   3.3  Average Risk       5.0   4.4  2 X Average Risk   9.6   7.1  3 X Average Risk  23.4   11.0        Use the calculated Patient Ratio above and the CHD Risk Table to determine the patient's CHD Risk.        ATP III CLASSIFICATION (LDL):  <100     mg/dL   Optimal  098-119  mg/dL   Near or Above                    Optimal  130-159  mg/dL   Borderline  147-829  mg/dL   High  >562     mg/dL  Very High Performed at [facility], [address] [place], [place] [address]   TSH     Status: None   Collection Time: 12/08/20  6:08 AM  Result Value Ref Range   TSH 1.449 0.350 - 4.500 uIU/mL    Comment: Performed  by a 3rd Generation assay with a functional sensitivity of <=0.01 uIU/mL. Performed at [facility], [address] [place], [place] [address]   Hepatitis panel, acute     Status: Abnormal   Collection Time: 12/08/20  6:08 AM  Result Value Ref Range   Hepatitis B Surface Ag NON REACTIVE NON REACTIVE   HCV Ab Reactive (A) NON REACTIVE    Comment: (NOTE) The CDC recommends that a Reactive HCV antibody result be followed up  with a HCV Nucleic Acid Amplification test.     Hep A IgM NON REACTIVE NON REACTIVE   Hep B C IgM NON REACTIVE NON REACTIVE    Comment: Performed at [facility], [address] [place], [place] [address]    Blood Alcohol level:  Lab Results  Component Value Date   ETH <10 12/06/2020   ETH 213 (H) 07/27/2018    Metabolic Disorder Labs: Lab Results  Component Value Date   HGBA1C 5.1 12/08/2020   MPG 99.67 12/08/2020   No results found for: PROLACTIN Lab Results  Component Value Date   CHOL 112 12/08/2020   TRIG 97 12/08/2020   HDL 27 (L) 12/08/2020   CHOLHDL 4.1 12/08/2020   VLDL 19 12/08/2020   LDLCALC 66 12/08/2020    Physical Findings: AIMS: Facial and Oral Movements Muscles of Facial Expression: None, normal Lips and Perioral Area: None, normal Jaw: None, normal Tongue: None, normal,Extremity Movements Upper (arms, wrists, hands, fingers): None, normal Lower (legs, knees, ankles, toes): None, normal, Trunk Movements Neck, shoulders, hips: None, normal, Overall Severity Severity of abnormal movements (highest score from questions above): None, normal Incapacitation due to abnormal movements: None, normal Patient's awareness of abnormal movements (rate only patient's report): No Awareness, Dental Status Current problems with teeth and/or dentures?: No Does patient usually wear dentures?: No  CIWA:    COWS:     Musculoskeletal: Strength & Muscle Tone: within normal limits Gait & Station: normal Patient leans:  N/A  Psychiatric Specialty Exam:  Presentation  General Appearance: Appropriate for Environment  Eye Contact:Fair  Speech:Clear and Coherent  Speech Volume:Normal  Handedness:Right   Mood and Affect  Mood:Euthymic  Affect:Congruent   Thought Process  Thought Processes:Coherent  Descriptions of Associations:Intact  Orientation:Full (Time, Place and Person)  Thought Content:Logical  History of Schizophrenia/Schizoaffective disorder:No data recorded Duration of Psychotic Symptoms:Less than six months  Hallucinations:Hallucinations: None  Ideas of Reference:None  Suicidal Thoughts:Suicidal Thoughts: No SI Active Intent and/or Plan: With Intent  Homicidal Thoughts:Homicidal Thoughts: No   Sensorium  Memory:Immediate Fair; Recent Fair; Remote Fair  Judgment:Fair  Insight:Fair   Executive Functions  Concentration:Fair  Attention Span:Fair  Recall:Fair  Fund of Knowledge:Fair  Language:Fair   Psychomotor Activity  Psychomotor Activity:Psychomotor Activity: Normal   Assets  Assets:Desire for Improvement; Resilience; Social Support; Housing   Sleep  Sleep:Sleep: Good Number of Hours of Sleep: 6.75    Physical Exam: Physical Exam Vitals and nursing note reviewed.  Constitutional:      Appearance: Normal appearance.  HENT:     Head: Normocephalic and atraumatic.  Pulmonary:     Effort: Pulmonary effort is normal.  Neurological:     General: No focal deficit  present.     Mental Status: He is alert and oriented to person, place, and time.    ROS Blood pressure 106/65, pulse 83, temperature 97.9 F (36.6 C), temperature source Oral, resp. rate 17, height 5\' 11"  (1.803 m), weight 113.4 kg, SpO2 98 %. Body mass index is 34.87 kg/m.   Treatment Plan Summary: Daily contact with patient to assess and evaluate symptoms and progress in treatment, Medication management and Plan : Patient is seen and examined.  Patient is a 30 year old male  with the above-stated past psychiatric history who is seen in follow-up.  Diagnosis: 1.  Unspecified schizophrenia 2.  Cannabis use disorder 3.  Hepatitis C  Pertinent findings on examination today: 1.  Mood and affect are significantly improved. 2.  No psychotic symptoms today. 3.  Patient admitted to a history of hepatitis C and we discussed treatment options after discharge. 4.  No suicidal ideation.  Plan: 1.  Continue folic acid 1 mg p.o. daily for nutritional supplementation. 2.  Continue hydroxyzine 25 mg p.o. 3 times daily as needed anxiety. 3.  Stop lorazepam for CIWA. 4.  Continue Risperdal 2 mg p.o. twice daily for psychosis. 5.  Continue sertraline 50 mg p.o. daily for depression and anxiety. 6.  Continue trazodone 50 mg p.o. nightly as needed insomnia. 7.  We will refer to either primary care or gastroenterology after discharge for possible treatment of hepatitis C. 8.  Disposition planning-in progress. 26, MD 12/09/2020, 11:48 AM

## 2020-12-09 NOTE — Progress Notes (Signed)
Patient did attend the evening speaker AA meeting.  

## 2020-12-09 NOTE — BHH Group Notes (Signed)
Type of Therapy: Gratitude   Participation Level:  Active  Summary of Progress/Problems: Pt received the packet for group. Pt followed along throughout the group. During introductions pt shared that he is grateful for having cake for lunch today. Pt shared that he is afraid of what comes next after discharge and agreed with the group that the unknown is scary.  The Pt states that he wants to make better choices and is looking forward to discharge.

## 2020-12-09 NOTE — Plan of Care (Signed)
  Problem: Education: Goal: Knowledge of [facility] Education information/materials will improve Outcome: Progressing Goal: Emotional status will improve Outcome: Progressing Goal: Mental status will improve Outcome: Progressing Goal: Verbalization of understanding the information provided will improve Outcome: Progressing   

## 2020-12-09 NOTE — BHH Group Notes (Signed)
Patient did not attended morning group.

## 2020-12-09 NOTE — Progress Notes (Signed)
Pt has been calm and cooperative with care, reported good day and good appetite. Pt given Vistaril for sleep, did not want to take trazodone stating it makes him sick. Pt attended the AA group and participated. Denied SI/HI and contracted for safety, support and encouragement offered as needed, will continue to monitor.

## 2020-12-09 NOTE — Tx Team (Signed)
Interdisciplinary Treatment and Diagnostic Plan Update  12/09/2020 Time of Session: 9:30am Marc Hart MRN: [id number]  Principal Diagnosis: <principal problem not specified>  Secondary Diagnoses: Active Problems:   Schizophrenia ([place])   Current Medications:  Current Facility-Administered Medications  Medication Dose Route Frequency Provider Last Rate Last Admin  . alum & mag hydroxide-simeth (MAALOX/MYLANTA) 200-200-20 MG/5ML suspension 30 mL  30 mL Oral Q4H PRN Nwoko, Uchenna E, PA      . folic acid (FOLVITE) tablet 1 mg  1 mg Oral Daily Sharma Covert, MD   1 mg at 12/09/20 0756  . hydrOXYzine (ATARAX/VISTARIL) tablet 25 mg  25 mg Oral TID PRN Ileene Musa E, PA   25 mg at 12/08/20 2106  . LORazepam (ATIVAN) tablet 1 mg  1 mg Oral Q6H PRN Sharma Covert, MD      . magnesium hydroxide (MILK OF MAGNESIA) suspension 30 mL  30 mL Oral Daily PRN Nwoko, Uchenna E, PA      . risperiDONE (RISPERDAL M-TABS) disintegrating tablet 2 mg  2 mg Oral BID Sharma Covert, MD   2 mg at 12/09/20 0756  . sertraline (ZOLOFT) tablet 50 mg  50 mg Oral Daily Sharma Covert, MD   50 mg at 12/09/20 0757  . thiamine tablet 100 mg  100 mg Oral Daily Sharma Covert, MD   100 mg at 12/09/20 0756  . traZODone (DESYREL) tablet 50 mg  50 mg Oral QHS PRN Nwoko, Uchenna E, PA       PTA Medications: Facility-Administered Medications Prior to Admission  Medication Dose Route Frequency Provider Last Rate Last Admin  . paliperidone (INVEGA SUSTENNA) injection 156 mg  156 mg Intramuscular Q28 days Eulis Canner E, NP   156 mg at 11/16/20 1602   Medications Prior to Admission  Medication Sig Dispense Refill Last Dose  . paliperidone (INVEGA SUSTENNA) 156 MG/ML SUSY injection Inject 1 mL (156 mg total) into the muscle every 28 (twenty-eight) days. Last administration 07/02/18. 1.2 mL 9   . sertraline (ZOLOFT) 50 MG tablet Take 1 tablet (50 mg total) by mouth daily. 30 tablet 2   . traZODone  (DESYREL) 50 MG tablet Take 1 tablet (50 mg total) by mouth at bedtime as needed for sleep. 30 tablet 2     Patient Stressors: Financial difficulties Marital or family conflict Occupational concerns  Patient Strengths: Ability for insight Active sense of humor Motivation for treatment/growth Supportive family/friends  Treatment Modalities: Medication Management, Group therapy, Case management,  1 to 1 session with clinician, Psychoeducation, Recreational therapy.   Physician Treatment Plan for Primary Diagnosis: <principal problem not specified> Long Term Goal(s): Improvement in symptoms so as ready for discharge Improvement in symptoms so as ready for discharge   Short Term Goals: Ability to identify changes in lifestyle to reduce recurrence of condition will improve Ability to verbalize feelings will improve Ability to disclose and discuss suicidal ideas Ability to demonstrate self-control will improve Ability to identify and develop effective coping behaviors will improve Ability to maintain clinical measurements within normal limits will improve Compliance with prescribed medications will improve Ability to identify triggers associated with substance abuse/mental health issues will improve Ability to identify changes in lifestyle to reduce recurrence of condition will improve Ability to verbalize feelings will improve Ability to disclose and discuss suicidal ideas Ability to demonstrate self-control will improve Ability to identify and develop effective coping behaviors will improve Ability to maintain clinical measurements within normal limits will improve Compliance with  prescribed medications will improve Ability to identify triggers associated with substance abuse/mental health issues will improve  Medication Management: Evaluate patient's response, side effects, and tolerance of medication regimen.  Therapeutic Interventions: 1 to 1 sessions, Unit Group sessions and  Medication administration.  Evaluation of Outcomes: Not Met  Physician Treatment Plan for Secondary Diagnosis: Active Problems:   Schizophrenia ([place])  Long Term Goal(s): Improvement in symptoms so as ready for discharge Improvement in symptoms so as ready for discharge   Short Term Goals: Ability to identify changes in lifestyle to reduce recurrence of condition will improve Ability to verbalize feelings will improve Ability to disclose and discuss suicidal ideas Ability to demonstrate self-control will improve Ability to identify and develop effective coping behaviors will improve Ability to maintain clinical measurements within normal limits will improve Compliance with prescribed medications will improve Ability to identify triggers associated with substance abuse/mental health issues will improve Ability to identify changes in lifestyle to reduce recurrence of condition will improve Ability to verbalize feelings will improve Ability to disclose and discuss suicidal ideas Ability to demonstrate self-control will improve Ability to identify and develop effective coping behaviors will improve Ability to maintain clinical measurements within normal limits will improve Compliance with prescribed medications will improve Ability to identify triggers associated with substance abuse/mental health issues will improve     Medication Management: Evaluate patient's response, side effects, and tolerance of medication regimen.  Therapeutic Interventions: 1 to 1 sessions, Unit Group sessions and Medication administration.  Evaluation of Outcomes: Not Met   RN Treatment Plan for Primary Diagnosis: <principal problem not specified> Long Term Goal(s): Knowledge of disease and therapeutic regimen to maintain health will improve  Short Term Goals: Ability to participate in decision making will improve, Ability to verbalize feelings will improve and Ability to identify and develop effective  coping behaviors will improve  Medication Management: RN will administer medications as ordered by provider, will assess and evaluate patient's response and provide education to patient for prescribed medication. RN will report any adverse and/or side effects to prescribing provider.  Therapeutic Interventions: 1 on 1 counseling sessions, Psychoeducation, Medication administration, Evaluate responses to treatment, Monitor vital signs and CBGs as ordered, Perform/monitor CIWA, COWS, AIMS and Fall Risk screenings as ordered, Perform wound care treatments as ordered.  Evaluation of Outcomes: Not Met   LCSW Treatment Plan for Primary Diagnosis: <principal problem not specified> Long Term Goal(s): Safe transition to appropriate next level of care at discharge, Engage patient in therapeutic group addressing interpersonal concerns.  Short Term Goals: Engage patient in aftercare planning with referrals and resources, Increase social support and Increase ability to appropriately verbalize feelings  Therapeutic Interventions: Assess for all discharge needs, 1 to 1 time with Social worker, Explore available resources and support systems, Assess for adequacy in community support network, Educate family and significant other(s) on suicide prevention, Complete Psychosocial Assessment, Interpersonal group therapy.  Evaluation of Outcomes: Not Met   Progress in Treatment: Attending groups: Yes. Participating in groups: Yes. Taking medication as prescribed: Yes. Toleration medication: Yes. Family/Significant other contact made: No, will contact:  Mother Patient understands diagnosis: No. Discussing patient identified problems/goals with staff: No. Medical problems stabilized or resolved: Yes. Denies suicidal/homicidal ideation: Yes. Issues/concerns per patient self-inventory: No. Other: None  New problem(s) identified: No, Describe:  None  New Short Term/Long Term Goal(s):medication stabilization,  elimination of SI thoughts, development of comprehensive mental wellness plan.  Patient Goals:  Pt declined to participate stating his stomach hurt.   Discharge  Plan or Barriers: Patient recently admitted. CSW will continue to follow and assess for appropriate referrals and possible discharge planning.  Reason for Continuation of Hospitalization: Depression Hallucinations Medication stabilization Suicidal ideation  Estimated Length of Stay: 3-5 days  Attendees: Patient: 12/09/2020   Physician: Dr. Myles Lipps 12/09/2020   Nursing:  12/09/2020   RN Care Manager: 12/09/2020   Social Worker: Toney Reil, [place]  12/09/2020   Recreational Therapist:  12/09/2020   Other:  12/09/2020   Other:  12/09/2020   Other: 12/09/2020      Scribe for Treatment Team: Mliss Fritz, Latanya Presser 12/09/2020 2:31 PM

## 2020-12-09 NOTE — Progress Notes (Signed)
Recreation Therapy Notes  Date: 4.8.22 Time: 0930 Location: [address] Dayroom  Group Topic: Stress Management  Goal Area(s) Addresses:  Patient will identify positive stress management techniques. Patient will identify benefits of using stress management post d/c.  Intervention: Stress Management  Activity: Meditation.  LRT played a meditation that focused on making the most of your day and having a positive outlook on what your day can be when you have positive intentions.    Education:  Stress Management, Discharge Planning.   Education Outcome: Acknowledges Education  Clinical Observations/Feedback: Pt did not attend group session.   Caroll Rancher, LRT/CTRS         Caroll Rancher A 12/09/2020 10:41 AM

## 2020-12-10 NOTE — Progress Notes (Signed)
[facility] Group Notes:  (Nursing/MHT/Case Management/Adjunct)  Date:  12/10/2020  Time:  2000  Type of Therapy:  wrap up group  Participation Level:  Active  Participation Quality:  Appropriate, Attentive, Sharing and Supportive  Affect:  Depressed and Flat  Cognitive:  Alert  Insight:  Improving  Engagement in Group:  Engaged  Modes of Intervention:  Clarification, Education and Support  Summary of Progress/Problems: Positive thinking and positive change were discussed.   Marcille Buffy 12/10/2020, 11:26 PM

## 2020-12-10 NOTE — [facility] Group Notes (Signed)
.  Psychoeducational Group Note  Date: 12/10/2020 Time: 0900-1000    Goal Setting   Purpose of Group: This group helps to provide patients with the steps of setting a goal that is specific, measurable, attainable, realistic and time specific. A discussion on how we keep ourselves stuck with negative self talk.    Participation Level:  Did not attend   Teran Daughenbaugh A  

## 2020-12-10 NOTE — Progress Notes (Signed)
[facility] MD Progress Note  12/10/2020 2:04 PM Marc Hart  MRN:  [id number]  Subjective: Marc Hart reports, "I'm doing better than when I first got to the hospital. My mood is improving. I think my medicines are helping so far. I slept well last night.   Objective: Patient is a 30 year old male with a past psychiatric history significant for unspecified schizophrenia who presented to the [facility] emergency department on 12/06/2020 after he reported that he had taken 10 trazodone tablets in a suicide attempt. Daily notes: Marc Hart is seen, chart reviewed. The chart findings discussed with the treatment team. He is lying down in bed, but alert & oriented x 3. He says he is attending group sessions.  He says he is feeling a lot better than when he first got admitted to the hospital. He says he is sleeping well at night. He denies any suicidality.  A review of previous notes indicated that he was able to talk to his mother 2 nights ago, but they did not talk about the housing situation. Had discussed his lab results which showed a positive hepatitis C with another provider earlier.  The patient admitted that he knew he had hepatitis C previously.  He denies that he had had treatment for it in the past. Had discussed treatment options previously.  He denied any side effects to his current medications.  No auditory or visual hallucinations.  Laboratory results from 12/08/20 included a normal lipid panel, hepatitis A, B were all negative, hepatitis C antibody was positive.  His vital signs are stable, he is afebrile.  He slept 6.75 hours last night. Will be referred to a primary care provider upon discharge for the Hep C treatment. Will continue his current plan of care as already in progress.  Principal Problem: Schizoaffective disorder (HCC) Diagnosis: Principal Problem:   Schizoaffective disorder (HCC) Active Problems:   Schizophrenia (HCC)  Total Time spent with patient: 15 minutes  Past Psychiatric History:  See admission H&P  Past Medical History:  Past Medical History:  Diagnosis Date  . Schizophrenia (HCC)   . Substance abuse [facility])     Past Surgical History:  Procedure Laterality Date  . OTHER SURGICAL HISTORY     Ear Surgery    Family History: History reviewed. No pertinent family history.   Family Psychiatric  History: See admission H&P  Social History:  Social History   Substance and Sexual Activity  Alcohol Use Yes   Comment: 6-9 tallboy beers/day     Social History   Substance and Sexual Activity  Drug Use Yes  . Types: Marijuana, IV, Cocaine, Methamphetamines   Comment: heroin 1gram/day    Social History   Socioeconomic History  . Marital status: Single    Spouse name: Not on file  . Number of children: Not on file  . Years of education: Not on file  . Highest education level: Not on file  Occupational History  . Not on file  Tobacco Use  . Smoking status: Current Some Day Smoker  . Smokeless tobacco: Never Used  Vaping Use  . Vaping Use: Never used  Substance and Sexual Activity  . Alcohol use: Yes    Comment: 6-9 tallboy beers/day  . Drug use: Yes    Types: Marijuana, IV, Cocaine, Methamphetamines    Comment: heroin 1gram/day  . Sexual activity: Not on file  Other Topics Concern  . Not on file  Social History Narrative   ** Merged History Encounter **  Social Determinants of Health   Financial Resource Strain: Not on file  Food Insecurity: Not on file  Transportation Needs: Not on file  Physical Activity: Not on file  Stress: Not on file  Social Connections: Not on file   Additional Social History:   Sleep: Good  Appetite:  Good  Current Medications: Current Facility-Administered Medications  Medication Dose Route Frequency Provider Last Rate Last Admin  . alum & mag hydroxide-simeth (MAALOX/MYLANTA) 200-200-20 MG/5ML suspension 30 mL  30 mL Oral Q4H PRN Shirelle Tootle, Uchenna E, PA      . folic acid (FOLVITE) tablet 1 mg  1 mg Oral  Daily Antonieta Pert, MD   1 mg at 12/10/20 0805  . hydrOXYzine (ATARAX/VISTARIL) tablet 25 mg  25 mg Oral TID PRN Wynell Halberg, Uchenna E, PA   25 mg at 12/10/20 0805  . LORazepam (ATIVAN) tablet 1 mg  1 mg Oral Q6H PRN Antonieta Pert, MD      . magnesium hydroxide (MILK OF MAGNESIA) suspension 30 mL  30 mL Oral Daily PRN Khira Cudmore, Uchenna E, PA      . risperiDONE (RISPERDAL M-TABS) disintegrating tablet 2 mg  2 mg Oral BID Antonieta Pert, MD   2 mg at 12/10/20 0805  . sertraline (ZOLOFT) tablet 50 mg  50 mg Oral Daily Antonieta Pert, MD   50 mg at 12/10/20 0805  . thiamine tablet 100 mg  100 mg Oral Daily Antonieta Pert, MD   100 mg at 12/10/20 0804  . traZODone (DESYREL) tablet 50 mg  50 mg Oral QHS PRN Hanin Decook, Uchenna E, PA       Lab Results:  No results found for this or any previous visit (from the past 48 hour(s)).  Blood Alcohol level:  Lab Results  Component Value Date   ETH <10 12/06/2020   ETH 213 (H) 07/27/2018   Metabolic Disorder Labs: Lab Results  Component Value Date   HGBA1C 5.1 12/08/2020   MPG 99.67 12/08/2020   No results found for: PROLACTIN Lab Results  Component Value Date   CHOL 112 12/08/2020   TRIG 97 12/08/2020   HDL 27 (L) 12/08/2020   CHOLHDL 4.1 12/08/2020   VLDL 19 12/08/2020   LDLCALC 66 12/08/2020   Physical Findings: AIMS: Facial and Oral Movements Muscles of Facial Expression: None, normal Lips and Perioral Area: None, normal Jaw: None, normal Tongue: None, normal,Extremity Movements Upper (arms, wrists, hands, fingers): None, normal Lower (legs, knees, ankles, toes): None, normal, Trunk Movements Neck, shoulders, hips: None, normal, Overall Severity Severity of abnormal movements (highest score from questions above): None, normal Incapacitation due to abnormal movements: None, normal Patient's awareness of abnormal movements (rate only patient's report): No Awareness, Dental Status Current problems with teeth and/or dentures?:  No Does patient usually wear dentures?: No  CIWA:    COWS:     Musculoskeletal: Strength & Muscle Tone: within normal limits Gait & Station: normal Patient leans: N/A  Psychiatric Specialty Exam:  Presentation  General Appearance: Appropriate for Environment  Eye Contact:Fair  Speech:Clear and Coherent  Speech Volume:Normal  Handedness:Right   Mood and Affect  Mood:Euthymic  Affect:Congruent  Thought Process  Thought Processes:Coherent  Descriptions of Associations:Intact  Orientation:Full (Time, Place and Person)  Thought Content:Logical  History of Schizophrenia/Schizoaffective disorder:No data recorded Duration of Psychotic Symptoms:Less than six months  Hallucinations:Hallucinations: None  Ideas of Reference:None  Suicidal Thoughts:Suicidal Thoughts: No  Homicidal Thoughts:Homicidal Thoughts: No  Sensorium  Memory:Immediate Fair; Recent Fair; Remote Fair  Judgment:Fair  Insight:Fair  Executive Functions  Concentration:Fair  Attention Span:Fair  Recall:Fair  Fund of Knowledge:Fair  Language:Fair  Psychomotor Activity  Psychomotor Activity:Psychomotor Activity: Normal  Assets  Assets:Desire for Improvement; Resilience; Social Support; Housing  Sleep  Sleep:Sleep: Good Number of Hours of Sleep: 6.75  Physical Exam: Physical Exam Vitals and nursing note reviewed.  Constitutional:      Appearance: Normal appearance.  HENT:     Head: Normocephalic and atraumatic.  Pulmonary:     Effort: Pulmonary effort is normal.  Neurological:     General: No focal deficit present.     Mental Status: He is alert and oriented to person, place, and time.    Review of Systems  Constitutional: Negative for chills and fever.  HENT: Negative for congestion and sore throat.   Eyes: Negative for blurred vision.  Respiratory: Negative for cough, shortness of breath and wheezing.   Cardiovascular: Negative for chest pain and palpitations.   Gastrointestinal: Negative for abdominal pain, constipation, diarrhea, heartburn, nausea and vomiting.  Genitourinary: Negative for dysuria.  Musculoskeletal: Negative for joint pain and myalgias.  Skin: Negative.   Neurological: Negative for dizziness, tingling, tremors, sensory change, speech change, focal weakness, seizures, loss of consciousness, weakness and headaches.  Endo/Heme/Allergies:       Allergies: See the allergy lists.  Psychiatric/Behavioral: Positive for depression, hallucinations ("I'm still hearing voices, have dealt this most of my life".), substance abuse (Hx. THC use disorder) and suicidal ideas. Negative for memory loss. The patient is not nervous/anxious and does not have insomnia.    Blood pressure (!) 110/56, pulse 82, temperature 97.6 F (36.4 C), temperature source Oral, resp. rate 16, height 5\' 11"  (1.803 m), weight 113.4 kg, SpO2 99 %. Body mass index is 34.87 kg/m.  Treatment Plan Summary: Daily contact with patient to assess and evaluate symptoms and progress in treatment, Medication management and Plan : Patient is seen and examined.  Patient is a 30 year old male with the above-stated past psychiatric history who is seen in follow-up.   Continue inpatient hospitalization. Will continue today 12/10/2020 plan as below except where it is noted.  Diagnosis: 1.  Unspecified schizophrenia 2.  Cannabis use disorder 3.  Hepatitis C  Pertinent findings on examination today: 1.  Mood and affect are significantly improved. 2.  No psychotic symptoms today. 3.  Patient admitted to a history of hepatitis C and we discussed treatment options after discharge. 4.  No suicidal ideation.  Plan: 1.  Continue folic acid 1 mg p.o. daily for nutritional supplementation. 2.  Continue hydroxyzine 25 mg p.o. 3 times daily as needed anxiety. 3.  Stopped lorazepam for CIWA. 4.  Continue Risperdal 2 mg p.o. twice daily for psychosis. 5.  Continue sertraline 50 mg p.o. daily  for depression and anxiety. 6.  Continue trazodone 50 mg p.o. nightly as needed insomnia. 7.  We will refer to either primary care or gastroenterology after discharge for possible treatment of hepatitis C. 8.  Disposition planning-in progress.  02/09/2021, NP, PMHNP, FNP-BC 12/10/2020, 2:04 PMPatient ID: 02/09/2021 Andujar, male   DOB: May 01, 1991, 30 y.o.   MRN: [id number]

## 2020-12-10 NOTE — Progress Notes (Signed)
   12/10/20 0615  Vital Signs  Temp 97.6 F (36.4 C)  Temp Source Oral  Pulse Rate 63  BP (!) 127/93  BP Location Right Arm  BP Method Automatic  Patient Position (if appropriate) Sitting  Oxygen Therapy  SpO2 99 %   D: Patient denies SI/HI. Pt. Admits to seeing "things and lights." Pt. Rated anxiety 3/10 and depression as "low" Pt. Has been out in open areas, attended groups and was social with peers and staff.  A:  Patient took scheduled medicine. Pt given 25 mg. Of Vistaril for anxiety.   Support and encouragement provided Routine safety checks conducted every 15 minutes. Patient  Informed to notify staff with any concerns.   R:  Safety maintained.

## 2020-12-10 NOTE — [facility] Group Notes (Signed)
[facility] Group Notes: (Clinical Social Work)   12/10/2020      Type of Therapy:  Group Therapy   Participation Level:  Did Not Attend - was invited both individually by MHT and by overhead announcement, chose not to attend.   Ambrose Mantle, LCSW 12/10/2020, 3:34 PM

## 2020-12-11 NOTE — Progress Notes (Signed)
   12/11/20 2030  Psych Admission Type (Psych Patients Only)  Admission Status Involuntary  Psychosocial Assessment  Patient Complaints Depression;Sadness  Eye Contact Brief  Facial Expression Sad;Sullen  Affect Depressed  Speech Logical/coherent  Interaction Assertive  Motor Activity Other (Comment) (WDL)  Appearance/Hygiene In scrubs  Behavior Characteristics Cooperative;Appropriate to situation  Mood Pleasant  Thought Process  Coherency WDL  Content WDL  Delusions None reported or observed  Perception WDL  Hallucination None reported or observed  Judgment Poor  Confusion None  Danger to Self  Current suicidal ideation? Denies  Self-Injurious Behavior No self-injurious ideation or behavior indicators observed or expressed   Agreement Not to Harm Self Yes  Description of Agreement Verbal Contract  Danger to Others  Danger to Others None reported or observed

## 2020-12-11 NOTE — Progress Notes (Signed)
D:  Pt presents with sad affect and anxiousness this AM. Pt reports high levels of depression (9/10), hopelessness (7/10), and anxiety (8/10). Pt attended groups and interacted with peers. Pt denies SI/HI and AVH.  A:  Labs/Vitals monitored; Medication education provided; Pt supported emotionally; Pt asked to communicate his needs, concerns and questions.  R:  Pt remains safe with 15 minute checks; Will continue POC.

## 2020-12-11 NOTE — Progress Notes (Signed)
   12/10/20 2110  Psych Admission Type (Psych Patients Only)  Admission Status Involuntary  Psychosocial Assessment  Patient Complaints Anxiety;Depression;Sadness  Eye Contact Brief  Facial Expression Anxious;Sad;Sullen  Affect Anxious;Appropriate to circumstance;Preoccupied;Sad  Speech Logical/coherent  Interaction Assertive  Motor Activity Other (Comment) (WDL)  Appearance/Hygiene Unremarkable  Behavior Characteristics Cooperative;Appropriate to situation;Anxious  Mood Depressed;Anxious;Pleasant  Thought Process  Coherency WDL  Content WDL  Delusions None reported or observed  Perception Hallucinations  Hallucination Auditory ("voices and songs", pt isn't able to elaborate further on what the voices are saying)  Judgment Poor  Confusion None  Danger to Self  Current suicidal ideation? Denies  Self-Injurious Behavior No self-injurious ideation or behavior indicators observed or expressed   Danger to Others  Danger to Others None reported or observed

## 2020-12-11 NOTE — [facility] Group Notes (Signed)
Adult Psychoeducational Group Not Date:  12/11/2020 Time:  0900-1045 Group Topic/Focus: PROGRESSIVE RELAXATION. A group where deep breathing is taught and tensing and relaxation muscle groups is used. Imagery is used as well.  Pts are asked to imagine 3 pillars that hold them up when they are not able to hold themselves up.  Participation Level:  Active  Participation Quality:  Appropriate  Affect:  Appropriate  Cognitive:  Oriented  Insight: Improving  Engagement in Group:  Engaged  Modes of Intervention:  Activity, Discussion, Education, and Support  Additional Comments:  Pt rates his energy at a 10/10. States tht hie is herer to get etter and to feel good about himself  Dione Housekeeper

## 2020-12-11 NOTE — [facility] Group Notes (Signed)
Psychoeducational Group Note  Date: 12-11-20 Time:  1300  Group Topic/Focus:  Making Healthy Choices:   The focus of this group is to help patients identify negative/unhealthy choices they were using prior to admission and identify positive/healthier coping strategies to replace them upon discharge.In this group, patients started asking about the brain and how the brain works with and how the chemicals work for those who use substances, the pros and cons of saboxone.  Participation Level:  Did not attend   Dione Housekeeper

## 2020-12-11 NOTE — Progress Notes (Signed)
[facility]  12/11/2020 2:31 PM Monica MartinezLandon D Poteat  MRN:  [id number]  Subjective: Olegario ShearerLandon reports, "I'm doing okay. I think my Zoloft is causing me to have bad nightmares. Last night, I dreamt that my my two nephews, both 28 years old, stole a car, hit a dog with it & the dog's stomach hanging out. I woke up scared. I'm thinking that the Zoloft caused it".  Objective: Patient is a 30 year old male with a past psychiatric history significant for unspecified schizophrenia who presented to the [facility] emergency department on 12/06/2020 after he reported that he had taken 10 trazodone tablets in a suicide attempt. Daily notes: Olegario ShearerLandon is seen, chart reviewed. The chart findings discussed with the treatment team. He is  alert & oriented x 3. He is attending group sessions.  He says he is feeling a lot better than when he first got admitted to the hospital. He says he is sleeping well at night, only that he had a bad nightmare last night. See the subjective data above. He is blaming the bad dream on Zoloft, wants Zoloft discontinued. Informed patient that the dream is one occurrence, we will watch & make sure it is not ongoing & if it is, we will intervene with medications. He denies any suicidality.  A review of previous notes indicated that he was able to talk to his mother 3 nights ago, but they did not talk about the housing situation. Had discussed his lab results which showed a positive hepatitis C with another provider earlier.  The patient admitted that he knew he had hepatitis C previously.  He denies that he had had treatment for it in the past. Had discussed treatment options previously.  He denied any side effects to his current medications.  No auditory or visual hallucinations.  Laboratory results from 12/08/20 included a normal lipid panel, hepatitis A, B were all negative, hepatitis C antibody was positive.  His vital signs are stable, he is afebrile.  He is sleeping at night with a complain of  one time nightmare last night. Will be referred to a primary care provider upon discharge for the Hep C treatment. Will continue his current plan of care as already in progress. Patient seem like he is currently at his baseline.  Principal Problem: Schizoaffective disorder (HCC) Diagnosis: Principal Problem:   Schizoaffective disorder (HCC) Active Problems:   Schizophrenia (HCC)  Total Time spent with patient: 15 minutes  Past Psychiatric History: See admission H&P  Past Medical History:  Past Medical History:  Diagnosis Date  . Schizophrenia (HCC)   . Substance abuse [facility](HCC)     Past Surgical History:  Procedure Laterality Date  . OTHER SURGICAL HISTORY     Ear Surgery    Family History: History reviewed. No pertinent family history.   Family Psychiatric  History: See admission H&P  Social History:  Social History   Substance and Sexual Activity  Alcohol Use Yes   Comment: 6-9 tallboy beers/day     Social History   Substance and Sexual Activity  Drug Use Yes  . Types: Marijuana, IV, Cocaine, Methamphetamines   Comment: heroin 1gram/day    Social History   Socioeconomic History  . Marital status: Single    Spouse name: Not on file  . Number of children: Not on file  . Years of education: Not on file  . Highest education level: Not on file  Occupational History  . Not on file  Tobacco Use  . Smoking status:  Current Some Day Smoker  . Smokeless tobacco: Never Used  Vaping Use  . Vaping Use: Never used  Substance and Sexual Activity  . Alcohol use: Yes    Comment: 6-9 tallboy beers/day  . Drug use: Yes    Types: Marijuana, IV, Cocaine, Methamphetamines    Comment: heroin 1gram/day  . Sexual activity: Not on file  Other Topics Concern  . Not on file  Social History Narrative   ** Merged History Encounter **       Social Determinants of Health   Financial Resource Strain: Not on file  Food Insecurity: Not on file  Transportation Needs: Not on file   Physical Activity: Not on file  Stress: Not on file  Social Connections: Not on file   Additional Social History:   Sleep: Good  Appetite:  Good  Current Medications: Current Facility-Administered Medications  Medication Dose Route Frequency Provider Last Rate Last Admin  . alum & mag hydroxide-simeth (MAALOX/MYLANTA) 200-200-20 MG/5ML suspension 30 mL  30 mL Oral Q4H PRN Lahoma Constantin, Uchenna E, PA      . folic acid (FOLVITE) tablet 1 mg  1 mg Oral Daily Antonieta Pert, MD   1 mg at 12/11/20 6546  . hydrOXYzine (ATARAX/VISTARIL) tablet 25 mg  25 mg Oral TID PRN Karel Jarvis E, PA   25 mg at 12/10/20 2110  . magnesium hydroxide (MILK OF MAGNESIA) suspension 30 mL  30 mL Oral Daily PRN Nycole Kawahara, Uchenna E, PA      . risperiDONE (RISPERDAL M-TABS) disintegrating tablet 2 mg  2 mg Oral BID Antonieta Pert, MD   2 mg at 12/11/20 5035  . sertraline (ZOLOFT) tablet 50 mg  50 mg Oral Daily Antonieta Pert, MD   50 mg at 12/11/20 4656  . thiamine tablet 100 mg  100 mg Oral Daily Antonieta Pert, MD   100 mg at 12/11/20 8127  . traZODone (DESYREL) tablet 50 mg  50 mg Oral QHS PRN Desiray Orchard, Uchenna E, PA       Lab Results:  No results found for this or any previous visit (from the past 48 hour(s)).  Blood Alcohol level:  Lab Results  Component Value Date   ETH <10 12/06/2020   ETH 213 (H) 07/27/2018   Metabolic Disorder Labs: Lab Results  Component Value Date   HGBA1C 5.1 12/08/2020   MPG 99.67 12/08/2020   No results found for: PROLACTIN Lab Results  Component Value Date   CHOL 112 12/08/2020   TRIG 97 12/08/2020   HDL 27 (L) 12/08/2020   CHOLHDL 4.1 12/08/2020   VLDL 19 12/08/2020   LDLCALC 66 12/08/2020   Physical Findings: AIMS: Facial and Oral Movements Muscles of Facial Expression: None, normal Lips and Perioral Area: None, normal Jaw: None, normal Tongue: None, normal,Extremity Movements Upper (arms, wrists, hands, fingers): None, normal Lower (legs, knees,  ankles, toes): None, normal, Trunk Movements Neck, shoulders, hips: None, normal, Overall Severity Severity of abnormal movements (highest score from questions above): None, normal Incapacitation due to abnormal movements: None, normal Patient's awareness of abnormal movements (rate only patient's report): No Awareness, Dental Status Current problems with teeth and/or dentures?: No Does patient usually wear dentures?: No  CIWA:    COWS:     Musculoskeletal: Strength & Muscle Tone: within normal limits Gait & Station: normal Patient leans: N/A  Psychiatric Specialty Exam:  Presentation  General Appearance: Appropriate for Environment  Eye Contact:Fair  Speech:Clear and Coherent  Speech Volume:Normal  Handedness:Right  Mood and Affect  Mood:Euthymic  Affect:Congruent  Thought Process  Thought Processes:Coherent  Descriptions of Associations:Intact  Orientation:Full (Time, Place and Person)  Thought Content:Logical  History of Schizophrenia/Schizoaffective disorder:No data recorded Duration of Psychotic Symptoms:Less than six months  Hallucinations:No   Ideas of Reference:None  Suicidal Thoughts:No  Homicidal Thoughts:No   Sensorium  Memory:Immediate Fair; Recent Fair; Remote Fair  Judgment:Fair  Insight:Fair  Executive Functions  Concentration:Fair  Attention Span:Fair  Recall:Fair  Fund of Knowledge:Fair  Language:Fair  Psychomotor Activity  Psychomotor Activity:No   Assets  Assets:Desire for Improvement; Resilience; Social Support; Housing  Sleep  Sleep:No   Physical Exam: Physical Exam Vitals and nursing note reviewed.  Constitutional:      Appearance: Normal appearance.  HENT:     Head: Normocephalic and atraumatic.     Nose: Nose normal.     Mouth/Throat:     Pharynx: Oropharynx is clear.  Eyes:     Pupils: Pupils are equal, round, and reactive to light.  Cardiovascular:     Rate and Rhythm: Normal rate.  Pulmonary:      Effort: Pulmonary effort is normal.  Genitourinary:    Comments: Deferred Musculoskeletal:        General: Normal range of motion.     Cervical back: Normal range of motion.  Skin:    General: Skin is warm and dry.  Neurological:     General: No focal deficit present.     Mental Status: He is alert and oriented to person, place, and time.    Review of Systems  Constitutional: Negative for chills and fever.  HENT: Negative for congestion and sore throat.   Eyes: Negative for blurred vision.  Respiratory: Negative for cough, shortness of breath and wheezing.   Cardiovascular: Negative for chest pain and palpitations.  Gastrointestinal: Negative for abdominal pain, constipation, diarrhea, heartburn, nausea and vomiting.  Genitourinary: Negative for dysuria.  Musculoskeletal: Negative for joint pain and myalgias.  Skin: Negative.   Neurological: Negative for dizziness, tingling, tremors, sensory change, speech change, focal weakness, seizures, loss of consciousness, weakness and headaches.  Endo/Heme/Allergies:       Allergies: See the allergy lists.  Psychiatric/Behavioral: Positive for depression ( "Improving"), hallucinations ("I'm still hearing voices, have dealt this most of my life".) and substance abuse (Hx. THC use disorder). Negative for memory loss and suicidal ideas. The patient is not nervous/anxious and does not have insomnia.    Blood pressure 114/67, pulse 83, temperature (!) 97.4 F (36.3 C), temperature source Oral, resp. rate 16, height 5\' 11"  (1.803 m), weight 113.4 kg, SpO2 98 %. Body mass index is 34.87 kg/m.  Treatment Plan Summary: Daily contact with patient to assess and evaluate symptoms and progress in treatment, Medication management and Plan : Patient is seen and examined.  Patient is a 30 year old male with the above-stated past psychiatric history who is seen in follow-up.   Continue inpatient hospitalization. Will continue today 12/11/2020 plan as below  except where it is noted.  Diagnosis: 1.  Unspecified schizophrenia 2.  Cannabis use disorder 3.  Hepatitis C  Pertinent findings on examination today: 1.  Mood and affect are significantly improved. 2.  No psychotic symptoms today. 3.  Patient admitted to a history of hepatitis C and we discussed treatment options after discharge. 4.  No suicidal ideation.  Plan: 1.  Continue folic acid 1 mg p.o. daily for nutritional supplementation. 2.  Continue hydroxyzine 25 mg p.o. 3 times daily as needed anxiety. 3.  Stopped lorazepam  for CIWA. 4.  Continue Risperdal 2 mg p.o. twice daily for psychosis. 5.  Continue sertraline 50 mg p.o. daily for depression and anxiety. 6.  Continue trazodone 50 mg p.o. nightly as needed insomnia. 7.  We will refer to either primary care or gastroenterology after discharge for possible treatment of hepatitis C. 8.  Disposition planning-in progress.  Armandina Stammer, NP, PMHNP, FNP-BC 12/11/2020, 2:31 PMPatient ID: [name] Kass, male   DOB: [birth year], 30 y.o.   MRN: [id number] Patient ID: Josyah Achor Callander, male   DOB: [birth year], 30 y.o.   MRN: [id number]

## 2020-12-12 ENCOUNTER — Telehealth: Payer: Self-pay | Admitting: *Deleted

## 2020-12-12 DIAGNOSIS — F250 Schizoaffective disorder, bipolar type: Secondary | ICD-10-CM

## 2020-12-12 MED ORDER — TRAZODONE HCL 50 MG PO TABS: 50.0000 mg | ORAL_TABLET | Freq: Every evening | ORAL | 0 refills | Status: DC | PRN

## 2020-12-12 MED ORDER — PRAZOSIN HCL 1 MG PO CAPS: 1.0000 mg | ORAL_CAPSULE | Freq: Every day | ORAL | 0 refills | Status: DC

## 2020-12-12 MED ORDER — PRAZOSIN HCL 1 MG PO CAPS
1.0000 mg | ORAL_CAPSULE | Freq: Every day | ORAL | Status: DC
  Filled 2020-12-12: qty 7

## 2020-12-12 MED ORDER — RISPERIDONE 2 MG PO TBDP: 2.0000 mg | ORAL_TABLET | Freq: Two times a day (BID) | ORAL | 0 refills | Status: DC

## 2020-12-12 MED ORDER — SERTRALINE HCL 50 MG PO TABS: 50.0000 mg | ORAL_TABLET | Freq: Every day | ORAL | 2 refills | Status: DC

## 2020-12-12 NOTE — Telephone Encounter (Signed)
Pt states released from [facility] today, "Told to call you to get treatment for Hepatitis C." Pt former pt with  [facility]. Asked pt if he wished to re- establish care there, states "Well I'm indigent so I can't."  Asked pt if he would like to establish care through [facility], again states indigent. Advised to call HD, woman got on line and asked if "[facility] can help pt." When I repeated [facility] as I was about to ask for more information on resource, pt hung up.

## 2020-12-12 NOTE — Plan of Care (Signed)
Patient is active in the milieu. Got up and performed hygiene, had breakfast and received medications. Denied SI/HI/AVH. Expressing readiness for discharge and has no major concerns.

## 2020-12-12 NOTE — Discharge Summary (Signed)
Physician Discharge Summary Note  Patient:  Marc Hart is an 30 y.o., male MRN:  [id number] DOB:  26-Sep-1990 Patient phone:  [phone] (home)  [place]   [address] [place] [address],  Total Time spent with patient: 30 minutes  Date of Admission:  12/07/2020 Date of Discharge: 12/12/2020  Reason for Admission: (From MD's admission note): Patient is a 30 year old male with a reported past psychiatric history significant for unspecified schizophrenia who presented to the [facility] emergency department on 12/06/2020 after he reported that he had taken 10 trazodone tablets in a suicide attempt. After he took the trazodone he called 911, and stated he threw up in the EMS vehicle on its way to the hospital. Apparently he visualized the tablets. He was evaluated and decision was made to admit him to the hospital for evaluation and stabilization. He stated its been very difficult to be able to make appointments at the behavioral health urgent care center. He was referred there and has been followed by nurse practitioner there. He stated he had been off his antidepressant medications for about a month, and had been off all other medicines for a while because he was unable to get refills and to make follow-up appointments. He stated that he was having auditory hallucinations and that they were telling him to kill himself. He stated he is depressed and feels helpless, hopeless and worthless. He stated he had had side effects from some medication in the past, but it stopped taking that. He stated it caused him to have nightmares. He was last seen by his nurse practitioner on 11/28/2020. This was via video visit. During that visit he admitted that he had been noncompliant with Zoloft.. He stated he had only been back on the Zoloft for a couple of days. He had also just recently received his paliperidone long-acting injectable medication 156 mg approximately that day. He complained of  poor sleep at that time and his trazodone was increased to 50 mg p.o. nightly. He did admit to the nurse practitioner that he had relapsed on methamphetamines last month, but that he had been sober from methamphetamines over the last month and his sleep had improved. He admitted to financial stressors. He stated that he lives independently right now, and works to help pay the rent, but his mother has been helping him for a while, but since she now takes care of his son she is unable to provide financial support for him. He suspects that he will most likely be "put out". His last hospitalization at our facility was in 2019. He was diagnosed with schizophrenia as well as polysubstance abuse. He was discharged on the paliperidone long-acting injectable medication at that time.  Evaluation on the unit, day of discharge: Patient was seen and evaluated on the unit. Patient denies SI/HI/AVH, paranoia and delusions. He is attending group therapy and working on coping skills. He is taking his medications and has no issues with them. He is sleeping and eating well. He interacts appropriately with staff and peers. He lives with his family and denies access to weapons. He has a follow up appointment on 4/13 at [facility] in [place]. Patient is stable for discharge home today.   Principal Problem: Schizoaffective disorder [facility]) Discharge Diagnoses: Principal Problem:   Schizoaffective disorder (HCC) Active Problems:   Schizophrenia (HCC)   Past Psychiatric History: See H&P  Past Medical History:  Past Medical History:  Diagnosis Date  . Schizophrenia (HCC)   . Substance abuse (HCC)  Past Surgical History:  Procedure Laterality Date  . OTHER SURGICAL HISTORY     Ear Surgery    Family History: History reviewed. No pertinent family history. Family Psychiatric  History: See H&P Social History:  Social History   Substance and Sexual Activity  Alcohol Use Yes   Comment: 6-9 tallboy beers/day      Social History   Substance and Sexual Activity  Drug Use Yes  . Types: Marijuana, IV, Cocaine, Methamphetamines   Comment: heroin 1gram/day    Social History   Socioeconomic History  . Marital status: Single    Spouse name: Not on file  . Number of children: Not on file  . Years of education: Not on file  . Highest education level: Not on file  Occupational History  . Not on file  Tobacco Use  . Smoking status: Current Some Day Smoker  . Smokeless tobacco: Never Used  Vaping Use  . Vaping Use: Never used  Substance and Sexual Activity  . Alcohol use: Yes    Comment: 6-9 tallboy beers/day  . Drug use: Yes    Types: Marijuana, IV, Cocaine, Methamphetamines    Comment: heroin 1gram/day  . Sexual activity: Not on file  Other Topics Concern  . Not on file  Social History Narrative   ** Merged History Encounter **       Social Determinants of Health   Financial Resource Strain: Not on file  Food Insecurity: Not on file  Transportation Needs: Not on file  Physical Activity: Not on file  Stress: Not on file  Social Connections: Not on file    Hospital Course:  After the above admission evaluation, Marc Hart's presenting symptoms were noted. He was recommended for mood stabilization treatments. The medication regimen targeting those presenting symptoms were discussed with him & initiated with his consent. His UDS on arrival to the ED was positive for THC, alcohol was negative.  He was however medicated, stabilized & discharged on the medications as listed on his discharge medication lists below. Besides the mood stabilization treatments, Marc Hart was also enrolled & participated in the group counseling sessions being offered & held on this unit. He learned coping skills. He presented no other significant pre-existing medical issues that required treatment. He tolerated his treatment regimen without any adverse effects or reactions reported.   During the course of his  hospitalization, the 15-minute checks were adequate to ensure patient's safety. Marc Hart did not display any dangerous, violent or suicidal behavior on the unit.  He interacted with patients & staff appropriately, participated appropriately in the group sessions/therapies. His medications were addressed & adjusted to meet his needs. He was recommended for outpatient follow-up care & medication management upon discharge to assure continuity of care & mood stability.  At the time of discharge patient is not reporting any acute suicidal/homicidal ideations. He feels more confident about his self-care & in managing his mental health. He currently denies any new issues or concerns. Education and supportive counseling provided throughout his/her hospital stay & upon discharge.   Today upon his discharge evaluation with the attending psychiatrist, Marc Hart shares he is doing well. He denies any other specific concerns. He is sleeping well. His appetite is good. He denies other physical complaints. He denies AH/VH, delusional thoughts or paranoia. He does not appear to be responding to any internal stimuli. He feels that his medications have been helpful & is in agreement to continue his current treatment regimen as recommended. He was able to engage in safety  planning including plan to return to [facility] or contact emergency services if he feels unable to maintain his own safety or the safety of others. Pt had no further questions, comments, or concerns. He left [facility] with all personal belongings in no apparent distress. Transportation home per his mother.   Physical Findings: AIMS: Facial and Oral Movements Muscles of Facial Expression: None, normal Lips and Perioral Area: None, normal Jaw: None, normal Tongue: None, normal,Extremity Movements Upper (arms, wrists, hands, fingers): None, normal Lower (legs, knees, ankles, toes): None, normal, Trunk Movements Neck, shoulders, hips: None, normal, Overall Severity Severity  of abnormal movements (highest score from questions above): None, normal Incapacitation due to abnormal movements: None, normal Patient's awareness of abnormal movements (rate only patient's report): No Awareness, Dental Status Current problems with teeth and/or dentures?: No Does patient usually wear dentures?: No  CIWA:    COWS:     Musculoskeletal: Strength & Muscle Tone: within normal limits Gait & Station: normal Patient leans: N/A   Psychiatric Specialty Exam:  Presentation  General Appearance: Appropriate for Environment  Eye Contact:Fair  Speech:Clear and Coherent  Speech Volume:Normal  Handedness:Right  Mood and Affect  Mood:Euthymic  Affect:Congruent  Thought Process  Thought Processes:Coherent  Descriptions of Associations:Intact  Orientation:Full (Time, Place and Person)  Thought Content:Logical  History of Schizophrenia/Schizoaffective disorder:No data recorded Duration of Psychotic Symptoms:Less than six months  Hallucinations:No data recorded Ideas of Reference:None  Suicidal Thoughts:No data recorded Homicidal Thoughts:No data recorded  Sensorium  Memory:Immediate Fair; Recent Fair; Remote Fair  Judgment:Fair  Insight:Fair  Executive Functions  Concentration:Fair  Attention Span:Fair  Recall:Fair  Fund of Knowledge:Fair  Language:Fair  Psychomotor Activity  Psychomotor Activity:No data recorded  Assets  Assets:Desire for Improvement; Resilience; Social Support; Housing  Sleep  Sleep:No data recorded  Physical Exam: Physical Exam Vitals and nursing note reviewed.  Constitutional:      Appearance: Normal appearance.  HENT:     Head: Normocephalic.  Pulmonary:     Effort: Pulmonary effort is normal.  Musculoskeletal:        General: Normal range of motion.     Cervical back: Normal range of motion.  Neurological:     Mental Status: He is alert.  Psychiatric:        Attention and Perception: Attention and  perception normal. He is attentive. He does not perceive auditory hallucinations.        Mood and Affect: Mood normal.        Speech: Speech normal.        Behavior: Behavior normal. Behavior is cooperative.        Thought Content: Thought content is not paranoid or delusional. Thought content does not include homicidal or suicidal ideation. Thought content does not include homicidal or suicidal plan.        Cognition and Memory: Cognition normal.    Review of Systems  Constitutional: Negative for fever.  HENT: Negative for congestion, sinus pain and sore throat.   Respiratory: Negative for cough, shortness of breath and wheezing.   Cardiovascular: Negative for chest pain.  Gastrointestinal: Negative.   Genitourinary: Negative.   Musculoskeletal: Negative.   Neurological: Negative.    Blood pressure 114/67, pulse 83, temperature (!) 97.4 F (36.3 C), temperature source Oral, resp. rate 16, height 5\' 11"  (1.803 m), weight 113.4 kg, SpO2 98 %. Body mass index is 34.87 kg/m.   Have you used any form of tobacco in the last 30 days? (Cigarettes, Smokeless Tobacco, Cigars, and/or Pipes): Yes  Has  this patient used any form of tobacco in the last 30 days? (Cigarettes, Smokeless Tobacco, Cigars, and/or Pipes) Yes, N/A  Blood Alcohol level:  Lab Results  Component Value Date   ETH <10 12/06/2020   ETH 213 (H) 07/27/2018    Metabolic Disorder Labs:  Lab Results  Component Value Date   HGBA1C 5.1 12/08/2020   MPG 99.67 12/08/2020   No results found for: PROLACTIN Lab Results  Component Value Date   CHOL 112 12/08/2020   TRIG 97 12/08/2020   HDL 27 (L) 12/08/2020   CHOLHDL 4.1 12/08/2020   VLDL 19 12/08/2020   LDLCALC 66 12/08/2020    See Psychiatric Specialty Exam and Suicide Risk Assessment completed by Attending Physician prior to discharge.  Discharge destination:  Home  Is patient on multiple antipsychotic therapies at discharge:  No   Has Patient had three or more  failed trials of antipsychotic monotherapy by history:  No  Recommended Plan for Multiple Antipsychotic Therapies: NA  Discharge Instructions    Diet - low sodium heart healthy   Complete by: As directed    Increase activity slowly   Complete by: As directed      Allergies as of 12/12/2020      Reactions   Amoxicillin Anaphylaxis   Has patient had a PCN reaction causing immediate rash, facial/tongue/throat swelling, SOB or lightheadedness with hypotension: Yes Has patient had a PCN reaction causing severe rash involving mucus membranes or skin necrosis: No Has patient had a PCN reaction that required hospitalization No Has patient had a PCN reaction occurring within the last 10 years: Yes If all of the above answers are "NO", then may proceed with Cephalosporin use.   Other Other (See Comments)   coppertone suntan lotion caused rash   Peanut-containing Drug Products Other (See Comments)   Upset stomach   Tylenol [acetaminophen] Itching   Knees itched   Other Rash   Allergic reaction to coppertone suntan lotion   Tylenol [acetaminophen] Itching   "It makes my knees itchy"       Medication List    TAKE these medications     Indication  paliperidone 156 MG/ML Susy injection Commonly known as: INVEGA SUSTENNA Inject 1 mL (156 mg total) into the muscle every 28 (twenty-eight) days. Last administration 07/02/18.  Indication: Schizophrenia   prazosin 1 MG capsule Commonly known as: MINIPRESS Take 1 capsule (1 mg total) by mouth at bedtime.  Indication: Frightening Dreams   risperiDONE 2 MG disintegrating tablet Commonly known as: RISPERDAL M-TABS Take 1 tablet (2 mg total) by mouth 2 (two) times daily.  Indication: Schizophrenia   sertraline 50 MG tablet Commonly known as: Zoloft Take 1 tablet (50 mg total) by mouth daily.  Indication: Generalized Anxiety Disorder   traZODone 50 MG tablet Commonly known as: DESYREL Take 1 tablet (50 mg total) by mouth at bedtime as  needed for sleep.  Indication: Trouble Sleeping, Take one whole ( ) or one half ( ) tablet as needed for sleep       Follow-up Information    Services, [facility]. Go on 12/14/2020.   Why: You have a hospital follow up appointment for therapy and medication management services on 12/14/20 on 10:00 am.  This appointment will be held in person.  Contact information: [address] [place] [address] [phone]               Follow-up recommendations:  Activity:  as tolerated Diet:  Heart healthy  Comments:  Prescriptions  given at discharge.  Patient agreeable to plan.  Given opportunity to ask questions.  Appears to feel comfortable with discharge denies any current suicidal or homicidal thoughts.   Patient is instructed prior to discharge to: Take all medications as prescribed by his mental healthcare provider. Report any adverse effects and or reactions from the medicines to his outpatient provider promptly. Patient has been instructed & cautioned: To not engage in alcohol and or illegal drug use while on prescription medicines. In the event of worsening symptoms, patient is instructed to call the crisis hotline, 911 and or go to the nearest ED for appropriate evaluation and treatment of symptoms. To follow-up with his primary care provider for your other medical issues, concerns and or health care needs.   Signed: Laveda AbbeLaurie Britton Mouhamed Glassco, NP 12/12/2020, 12:17 PM

## 2020-12-12 NOTE — [facility] Suicide Risk Assessment (Signed)
[facility] INPATIENT:  Family/Significant Other Suicide Prevention Education  Suicide Prevention Education:  Education Completed; Marc Hart [phone] (Mother) has been identified by the patient as the family member/significant other with whom the patient will be residing, and identified as the person(s) who will aid the patient in the event of a mental health crisis (suicidal ideations/suicide attempt).  With written consent from the patient, the family member/significant other has been provided the following suicide prevention education, prior to the and/or following the discharge of the patient.  The suicide prevention education provided includes the following:  Suicide risk factors  Suicide prevention and interventions  National Suicide Hotline telephone number  [facility] assessment telephone number  [place] [facility] Emergency Assistance 911  [facility] and/or Residential Mobile Crisis Unit telephone number  Request made of family/significant other to:  Remove weapons (e.g., guns, rifles, knives), all items previously/currently identified as safety concern.    Remove drugs/medications (over-the-counter, prescriptions, illicit drugs), all items previously/currently identified as a safety concern.  The family member/significant other verbalizes understanding of the suicide prevention education information provided.  The family member/significant other agrees to remove the items of safety concern listed above.  CSW spoke with Marc Hart who states that her son has been dealing with mental health issues since he was 30 years old.  Marc Hart states that her son rents a home from her but has a difficult time being alone.  Marc Hart states that her son was denied Disability and has not held a job for longer than 3 months at a time.  Marc Hart states that she has no intention of kicking her son out of his home but states that she maintains her distance from him because  he wants to spend all of his time with her.  Marc Hart states that her son self-medicates with substances but states that he use to get the [place] shot and it was helpful but she could not take him to the appointments each time because of her work schedule.  Marc Hart states that she will continue to help her son and try her best to keep an eye on him as often as possible. Marc Hart states that her son sounds better on the phone and that he can come back to the home today.  CSW completed SPE with Marc Hart.   Metro Kung Elexius Minar 12/12/2020, 9:25 AM

## 2020-12-12 NOTE — Progress Notes (Signed)
Recreation Therapy Notes  Date: 4.11.22 Time: 0930 Location: [address] Dayroom  Group Topic: Stress Management   Goal Area(s) Addresses:  Patient will actively participate in stress management techniques presented during session.  Patient will successfully identify benefit of practicing stress management post d/c.   Behavioral Response: Appropriate  Intervention: Guided exercise with ambient sound and script  Activity :Guided Imagery  LRT read a script that focused envisioning being in a meadow in the summer and enjoying the sights, sounds and feeling of being outdoors.  Patients were to listen and follow along as script was read to fully engage in activity.   Education:  Stress Management, Discharge Planning.   Education Outcome: Acknowledges education  Clinical Observations/Feedback: Patient attended and participated in activity.   Caroll Rancher, LRT/CTRS         Caroll Rancher A 12/12/2020 10:52 AM

## 2020-12-12 NOTE — Progress Notes (Signed)
Patient discharged as recommended by MD. Denied SI/HI. Verbalized understanding of discharge instructions.All belongings returned. No sign of distress.

## 2020-12-12 NOTE — [facility] Suicide Risk Assessment (Signed)
[facility] Discharge Suicide Risk Assessment   Principal Problem: Schizoaffective disorder [facility]) Discharge Diagnoses: Principal Problem:   Schizoaffective disorder (HCC) Active Problems:   Schizophrenia (HCC)   Total Time spent with patient: 15 minutes  Musculoskeletal: Strength & Muscle Tone: within normal limits Gait & Station: normal Patient leans: N/A  Psychiatric Specialty Exam: Review of Systems  All other systems reviewed and are negative.   Blood pressure 114/67, pulse 83, temperature (!) 97.4 F (36.3 C), temperature source Oral, resp. rate 16, height 5\' 11"  (1.803 m), weight 113.4 kg, SpO2 98 %.Body mass index is 34.87 kg/m.  General Appearance: Casual  Eye Contact::  Good  Speech:  Normal Rate409  Volume:  Normal  Mood:  Euthymic  Affect:  Congruent  Thought Process:  Coherent and Descriptions of Associations: Intact  Orientation:  Full (Time, Place, and Person)  Thought Content:  Logical  Suicidal Thoughts:  No  Homicidal Thoughts:  No  Memory:  Immediate;   Good Recent;   Good Remote;   Good  Judgement:  Intact  Insight:  Fair  Psychomotor Activity:  Normal  Concentration:  Good  Recall:  Good  Fund of Knowledge:Good  Language: Good  Akathisia:  Negative  Handed:  Right  AIMS (if indicated):     Assets:  Desire for Improvement Housing Resilience Social Support  Sleep:  Number of Hours: 6.75  Cognition: WNL  ADL's:  Intact   Mental Status Per Nursing Assessment::   On Admission:  Suicidal ideation indicated by patient  Demographic Factors:  Male, Caucasian and Low socioeconomic status  Loss Factors: Financial problems/change in socioeconomic status  Historical Factors: Impulsivity  Risk Reduction Factors:   Living with another person, especially a relative and Positive social support  Continued Clinical Symptoms:  Schizophrenia:   Less than 80 years old Paranoid or undifferentiated type  Cognitive Features That Contribute To Risk:  None     Suicide Risk:  Minimal: No identifiable suicidal ideation.  Patients presenting with no risk factors but with morbid ruminations; may be classified as minimal risk based on the severity of the depressive symptoms   Follow-up Information    [facility]. Go on 12/14/2020.   Why: You have a hospital follow up appointment for therapy and medication management services on 12/14/20 on 10:00 am.  This appointment will be held in person.  Contact information: [address] [place] [phone]               Plan Of Care/Follow-up recommendations:  Activity:  ad lib  [phone], MD 12/12/2020, 9:19 AM

## 2020-12-12 NOTE — Progress Notes (Signed)
  [facility] Adult Case Management Discharge Plan :  Will you be returning to the same living situation after discharge:  Yes,  Home At discharge, do you have transportation home?: Yes,  Mother Do you have the ability to pay for your medications: Yes,  Family   Release of information consent forms completed and in the chart;  Patient's signature needed at discharge.  Patient to Follow up at:  Follow-up Information    [facility]. Go on 12/14/2020.   Why: You have a hospital follow up appointment for therapy and medication management services on 12/14/20 on 10:00 am.  This appointment will be held in person.  Contact information: [address] [place] [address] [phone]               Next level of care provider has access to [facility] Link:no  Safety Planning and Suicide Prevention discussed: Yes,  with patient and mother   Have you used any form of tobacco in the last 30 days? (Cigarettes, Smokeless Tobacco, Cigars, and/or Pipes): Yes  Has patient been referred to the Quitline?: Patient refused referral  Patient has been referred for addiction treatment: N/A  Aram Beecham, LCSWA 12/12/2020, 9:24 AM

## 2020-12-15 ENCOUNTER — Other Ambulatory Visit: Payer: Self-pay

## 2020-12-15 ENCOUNTER — Ambulatory Visit (HOSPITAL_COMMUNITY): Payer: No Payment, Other | Admitting: *Deleted

## 2020-12-15 ENCOUNTER — Encounter (HOSPITAL_COMMUNITY): Payer: Self-pay

## 2020-12-15 VITALS — BP 133/69 | HR 66 | Ht 71.0 in | Wt 256.0 lb

## 2020-12-15 DIAGNOSIS — F209 Schizophrenia, unspecified: Secondary | ICD-10-CM

## 2020-12-15 NOTE — Progress Notes (Signed)
In as scheduled for his monthly injection of Invega S 156 mg, given today in his L deltoid without difficulty. He has just been discharged from [facility] after a suicide attempt overdosing on Trazodone. He states today he is feeling good. He got started on something for nightmares and because of it he is sleeping much better and feeling better. He also is taking po Risperdal. He states when he got home he had the VR paperwork to complete and he is working on it. Told him his chart from [facility] indicated he relapsed and I was disappointed he didn't tell it to me himself, and hoped he was clean now. States he is and sorry he didn't tell me himself. States he and his mom are doing OK right now. His next appt would be on the 12 or 13 of May but not able to come either day due to his mom having surgery and he will be helping her out. Scheduled for the 15 th. No complaints voiced. Praised him for calling for help himself after he took the Trazodone.

## 2021-01-12 ENCOUNTER — Ambulatory Visit (HOSPITAL_COMMUNITY): Payer: No Payment, Other

## 2021-01-16 ENCOUNTER — Ambulatory Visit (HOSPITAL_COMMUNITY): Payer: No Payment, Other

## 2021-02-21 ENCOUNTER — Telehealth (HOSPITAL_COMMUNITY): Payer: No Payment, Other | Admitting: Psychiatry

## 2021-02-23 ENCOUNTER — Telehealth: Payer: Self-pay

## 2021-02-23 ENCOUNTER — Other Ambulatory Visit (HOSPITAL_COMMUNITY): Payer: Self-pay

## 2021-02-23 NOTE — Telephone Encounter (Signed)
[facility] Patient Advocate Encounter ? ?Insurance verification completed.   ? ?The patient is uninsured and will need patient assistance for medication. ? ?We can complete the application and will need to meet with the patient for signatures and income documentation. ? ?Donjuan Robison, CPhT ?Specialty Pharmacy Patient Advocate ?[facility] ?Phone: [phone] ?Fax:  [phone]  ?

## 2021-02-24 ENCOUNTER — Other Ambulatory Visit: Payer: Self-pay

## 2021-02-24 ENCOUNTER — Ambulatory Visit (INDEPENDENT_AMBULATORY_CARE_PROVIDER_SITE_OTHER): Payer: Self-pay | Admitting: Family

## 2021-02-24 ENCOUNTER — Encounter: Payer: Self-pay | Admitting: Family

## 2021-02-24 VITALS — BP 112/76 | HR 63 | Temp 98.0°F | Wt 253.0 lb

## 2021-02-24 DIAGNOSIS — B182 Chronic viral hepatitis C: Secondary | ICD-10-CM | POA: Insufficient documentation

## 2021-02-24 DIAGNOSIS — R768 Other specified abnormal immunological findings in serum: Secondary | ICD-10-CM

## 2021-02-24 DIAGNOSIS — F201 Disorganized schizophrenia: Secondary | ICD-10-CM

## 2021-02-24 NOTE — Assessment & Plan Note (Signed)
Marc Hart is a 30 year old male with positive hepatitis C antibody with risk factors for hepatitis C including history of IV drug use and tattoos.  Has generalized abdominal pain unlikely related to hepatitis C and is treatment nave.  No personal or family history of liver disease.  We discussed the basics, risks if left untreated, treatment options, and financial aspects of treatment for hepatitis C.  Check blood work today including HIV status, hepatitis B status, fibrosis score, hepatitis C RNA level, hepatitis C genotype, and liver function testing.  Plan for treatment with Epclusa or Mavyret pending lab work results.

## 2021-02-24 NOTE — Patient Instructions (Signed)
Nice to see you.  We will check your blood work today and let you know the results.  Plan for follow up 1 month after starting medication.  Limit acetaminophen (Tylenol) usage to no more than 2 grams (2,000 mg) per day.  Avoid alcohol.  Do not share toothbrushes or razors.  Practice safe sex to protect against transmission as well as sexually transmitted disease.    Hepatitis C Hepatitis C is a viral infection of the liver. It can lead to scarring of the liver (cirrhosis), liver failure, or liver cancer. Hepatitis C may go undetected for months or years because people with the infection may not have symptoms, or they may have only mild symptoms. What are the causes? This condition is caused by the hepatitis C virus (HCV). The virus can spread from person to person (is contagious) through: Blood. Childbirth. A woman who has hepatitis C can pass it to her baby during birth. Bodily fluids, such as breast milk, tears, semen, vaginal fluids, and saliva. Blood transfusions or organ transplants done in the [place] before 1992.  What increases the risk? The following factors may make you more likely to develop this condition: Having contact with unclean (contaminated) needles or syringes. This may result from: Acupuncture. Tattoing. Body piercing. Injecting drugs. Having unprotected sex with someone who is infected. Needing treatment to filter your blood (kidney dialysis). Having HIV (human immunodeficiency virus) or AIDS (acquired immunodeficiency syndrome). Working in a job that involves contact with blood or bodily fluids, such as health care.  What are the signs or symptoms? Symptoms of this condition include: Fatigue. Loss of appetite. Nausea. Vomiting. Abdominal pain. Dark yellow urine. Yellowish skin and eyes (jaundice). Itchy skin. Clay-colored bowel movements. Joint pain. Bleeding and bruising easily. Fluid building up in your stomach (ascites).  In some  cases, you may not have any symptoms. How is this diagnosed? This condition is diagnosed with: Blood tests. Other tests to check how well your liver is functioning. They may include: Magnetic resonance elastography (MRE). This imaging test uses MRIs and sound waves to measure liver stiffness. Transient elastography. This imaging test uses ultrasounds to measure liver stiffness. Liver biopsy. This test requires taking a small tissue sample from your liver to examine it under a microscope.  How is this treated? Your health care provider may perform noninvasive tests or a liver biopsy to help decide the best course of treatment. Treatment may include: Antiviral medicines and other medicines. Follow-up treatments every 6-12 months for infections or other liver conditions. Receiving a donated liver (liver transplant).  Follow these instructions at home: Medicines Take over-the-counter and prescription medicines only as told by your health care provider. Take your antiviral medicine as told by your health care provider. Do not stop taking the antiviral even if you start to feel better. Do not take any medicines unless approved by your health care provider, including over-the-counter medicines and birth control pills. Activity Rest as needed. Do not have sex unless approved by your health care provider. Ask your health care provider when you may return to school or work. Eating and drinking Eat a balanced diet with plenty of fruits and vegetables, whole grains, and lowfat (lean) meats or non-meat proteins (such as beans or tofu). Drink enough fluids to keep your urine clear or pale yellow. Do not drink alcohol. General instructions Do not share toothbrushes, nail clippers, or razors. Wash your hands frequently with soap and water. If soap and water are not available, use hand sanitizer.  enough fluids to keep your urine clear or pale yellow.  Do not drink alcohol. General instructions  Do not share toothbrushes, nail clippers, or razors.  Wash your hands frequently with soap and water. If soap and water are not available, use hand sanitizer.  Cover any cuts or  open sores on your skin to prevent spreading the virus.  Keep all follow-up visits as told by your health care provider. This is important. You may need follow-up visits every 6-12 months. How is this prevented? There is no vaccine for hepatitis C. The only way to prevent the disease is to reduce the risk of exposure to the virus. Make sure you:  Wash your hands frequently with soap and water. If soap and water are not available, use hand sanitizer.  Do not share needles or syringes.  Practice safe sex and use condoms.  Avoid handling blood or bodily fluids without gloves or other protection.  Avoid getting tattoos or piercings in shops or other locations that are not clean.  Contact a health care provider if:  You have a fever.  You develop abdominal pain.  You pass dark urine.  You pass clay-colored stools.  You develop joint pain. Get help right away if:  You have increasing fatigue or weakness.  You lose your appetite.  You cannot eat or drink without vomiting.  You develop jaundice or your jaundice gets worse.  You bruise or bleed easily. Summary  Hepatitis C is a viral infection of the liver. It can lead to scarring of the liver (cirrhosis), liver failure, or liver cancer.  The hepatitis C virus (HCV) causes this condition. The virus can pass from person to person (is contagious).  You should not take any medicines unless approved by your health care provider. This includes over-the-counter medicines and birth control pills. This information is not intended to replace advice given to you by your health care provider. Make sure you discuss any questions you have with your health care provider. Document Released: 08/17/2000 Document Revised: 09/25/2016 Document Reviewed: 09/25/2016 Elsevier Interactive Patient Education  2018 Elsevier Inc.   

## 2021-02-24 NOTE — Progress Notes (Signed)
Subjective:    Patient ID: Marc Hart, male    DOB: [birth year], 30 y.o.   MRN: [id number]  Chief Complaint  Patient presents with   New Patient (Initial Visit)    New patient , hep C , discuss treatment , dx about 4 years ,no treatment in past     HPI:  Marc Hart is a 30 y.o. male with previous medical history of substance use and schizophrenia presenting today for evaluation and treatment of Hepatitis C. His mother is present today for the visit with his permission.  Marc Hart was initially diagnosed with hepatitis C approximately 4 years ago with risk factor being IV drug use and homemade tattoos.  He has not received treatment since that time.  Recently tested at [facility] health behavioral health and noted to have a positive hepatitis C antibody test.  Has generalized abdominal pain daily and denies nausea, vomiting, scleral icterus, or jaundice.  No personal or family history of liver disease.  No current recreational illicit drug use or alcohol consumption.  He vapes on a daily basis.  Available blood work reviewed with positive hepatitis C antibody.   Allergies  Allergen Reactions   Amoxicillin Anaphylaxis    Has patient had a PCN reaction causing immediate rash, facial/tongue/throat swelling, SOB or lightheadedness with hypotension: Yes Has patient had a PCN reaction causing severe rash involving mucus membranes or skin necrosis: No Has patient had a PCN reaction that required hospitalization No Has patient had a PCN reaction occurring within the last 10 years: Yes If all of the above answers are "NO", then may proceed with Cephalosporin use.   Tylenol [Acetaminophen] Itching    Knees itched   Other Rash    Allergic reaction to coppertone suntan lotion      Outpatient Medications Prior to Visit  Medication Sig Dispense Refill   paliperidone (INVEGA SUSTENNA) 156 MG/ML SUSY injection Inject 1 mL (156 mg total) into the muscle every 28 (twenty-eight) days. Last  administration 07/02/18. 1.2 mL 9   prazosin (MINIPRESS) 1 MG capsule Take 1 capsule (1 mg total) by mouth at bedtime. 30 capsule 0   risperiDONE (RISPERDAL M-TABS) 2 MG disintegrating tablet Take 1 tablet (2 mg total) by mouth 2 (two) times daily. 60 tablet 0   sertraline (ZOLOFT) 50 MG tablet Take 1 tablet (50 mg total) by mouth daily. 30 tablet 2   traZODone (DESYREL) 50 MG tablet Take 1 tablet (50 mg total) by mouth at bedtime as needed for sleep. 30 tablet 0   Facility-Administered Medications Prior to Visit  Medication Dose Route Frequency Provider Last Rate Last Admin   paliperidone (INVEGA SUSTENNA) injection 156 mg  156 mg Intramuscular Q28 days Toy Cookey E, NP   156 mg at 12/15/20 1612     Past Medical History:  Diagnosis Date   Schizophrenia (HCC)    Substance abuse (HCC)       Past Surgical History:  Procedure Laterality Date   OTHER SURGICAL HISTORY     Ear Surgery     History reviewed. No pertinent family history.   Social History   Socioeconomic History   Marital status: Single    Spouse name: Not on file   Number of children: Not on file   Years of education: Not on file   Highest education level: Not on file  Occupational History   Not on file  Tobacco Use   Smoking status: Some Days    Pack years: 0.00  Smokeless tobacco: Never  Vaping Use   Vaping Use: Every day  Substance and Sexual Activity   Alcohol use: Not Currently    Comment: 6-9 tallboy beers/day   Drug use: Not Currently    Types: Marijuana, IV, Cocaine, Methamphetamines    Comment: heroin 1gram/day   Sexual activity: Not on file  Other Topics Concern   Not on file  Social History Narrative   ** Merged History Encounter **       Social Determinants of Health   Financial Resource Strain: Not on file  Food Insecurity: Not on file  Transportation Needs: Not on file  Physical Activity: Not on file  Stress: Not on file  Social Connections: Not on file  Intimate Partner  Violence: Not on file      Review of Systems  Constitutional:  Negative for appetite change, chills, fatigue, fever and unexpected weight change.  Eyes:  Negative for visual disturbance.  Respiratory:  Negative for cough, chest tightness, shortness of breath and wheezing.   Cardiovascular:  Negative for chest pain and leg swelling.  Gastrointestinal:  Negative for abdominal pain, constipation, diarrhea, nausea and vomiting.  Genitourinary:  Negative for dysuria, flank pain, frequency, genital sores, hematuria and urgency.  Skin:  Negative for rash.  Allergic/Immunologic: Negative for immunocompromised state.  Neurological:  Negative for dizziness and headaches.      Objective:    BP 112/76   Pulse 63   Temp 98 F (36.7 C)   Wt 253 lb (114.8 kg)   SpO2 95%   BMI 35.29 kg/m  Nursing note and vital signs reviewed.  Physical Exam Constitutional:      General: He is not in acute distress.    Appearance: He is well-developed.  Eyes:     Conjunctiva/sclera: Conjunctivae normal.  Cardiovascular:     Rate and Rhythm: Normal rate and regular rhythm.     Heart sounds: Normal heart sounds. No murmur heard.   No friction rub. No gallop.  Pulmonary:     Effort: Pulmonary effort is normal. No respiratory distress.     Breath sounds: Normal breath sounds. No wheezing or rales.  Chest:     Chest wall: No tenderness.  Abdominal:     General: Bowel sounds are normal.     Palpations: Abdomen is soft.     Tenderness: There is no abdominal tenderness.  Musculoskeletal:     Cervical back: Neck supple.  Lymphadenopathy:     Cervical: No cervical adenopathy.  Skin:    General: Skin is warm and dry.     Findings: No rash.  Neurological:     Mental Status: He is alert and oriented to person, place, and time.  Psychiatric:        Behavior: Behavior normal.        Thought Content: Thought content normal.        Judgment: Judgment normal.        Assessment & Plan:   Patient  Active Problem List   Diagnosis Date Noted   Positive hepatitis C antibody test 02/24/2021   Suicidal thoughts    Schizophrenia (HCC) 06/26/2018   Depression    Polysubstance abuse (HCC)    Substance induced mood disorder (HCC) 09/25/2015   Alcohol dependence with uncomplicated withdrawal (HCC) 09/16/2015   Depressive disorder 09/12/2015   History of schizophrenia    Schizoaffective disorder (HCC) 12/18/2014     Problem List Items Addressed This Visit       Other  Positive hepatitis C antibody test - Primary    Marc Hart is a 30 year old male with positive hepatitis C antibody with risk factors for hepatitis C including history of IV drug use and tattoos.  Has generalized abdominal pain unlikely related to hepatitis C and is treatment nave.  No personal or family history of liver disease.  We discussed the basics, risks if left untreated, treatment options, and financial aspects of treatment for hepatitis C.  Check blood work today including HIV status, hepatitis B status, fibrosis score, hepatitis C RNA level, hepatitis C genotype, and liver function testing.  Plan for treatment with Epclusa or Mavyret pending lab work results.       Relevant Orders   Hepatic function panel   Hepatitis C genotype   Hepatitis C RNA quantitative   Liver Fibrosis, FibroTest-ActiTest   Protime-INR   Hepatitis B surface antibody,qualitative   Hepatitis B surface antigen   HIV Antibody (routine testing w rflx)    I am having Meldon D. Marinaro maintain his paliperidone, prazosin, risperiDONE, sertraline, and traZODone. We will continue to administer paliperidone.  Follow-up: 1 month after starting medication.     Marcos Eke, MSN, FNP-C Nurse Practitioner [facility] Main number: [phone]

## 2021-02-24 NOTE — Assessment & Plan Note (Signed)
Appears to be adequately controlled with present medication regimen. Continued symptoms control will be necessary in order to complete Hepatitis C treatment.

## 2021-03-01 ENCOUNTER — Other Ambulatory Visit: Payer: Self-pay | Admitting: Family

## 2021-03-01 LAB — LIVER FIBROSIS, FIBROTEST-ACTITEST
ALT: 241 U/L — ABNORMAL HIGH (ref 9–46)
Alpha-2-Macroglobulin: 184 mg/dL (ref 106–279)
Apolipoprotein A1: 119 mg/dL (ref 94–176)
Bilirubin: 0.9 mg/dL (ref 0.2–1.2)
Fibrosis Score: 0.27
GGT: 33 U/L (ref 3–70)
Haptoglobin: 88 mg/dL (ref 43–212)
Necroinflammat ACT Score: 0.85
Reference ID: 3923793

## 2021-03-01 LAB — HEPATIC FUNCTION PANEL
AG Ratio: 1.4 (calc) (ref 1.0–2.5)
ALT: 243 U/L — ABNORMAL HIGH (ref 9–46)
AST: 116 U/L — ABNORMAL HIGH (ref 10–40)
Albumin: 4.2 g/dL (ref 3.6–5.1)
Alkaline phosphatase (APISO): 92 U/L (ref 36–130)
Bilirubin, Direct: 0.2 mg/dL (ref 0.0–0.2)
Globulin: 3 g/dL (calc) (ref 1.9–3.7)
Indirect Bilirubin: 0.7 mg/dL (calc) (ref 0.2–1.2)
Total Bilirubin: 0.9 mg/dL (ref 0.2–1.2)
Total Protein: 7.2 g/dL (ref 6.1–8.1)

## 2021-03-01 LAB — PROTIME-INR
INR: 1
Prothrombin Time: 10.3 s (ref 9.0–11.5)

## 2021-03-01 LAB — HIV ANTIBODY (ROUTINE TESTING W REFLEX): HIV 1&2 Ab, 4th Generation: NONREACTIVE

## 2021-03-01 LAB — HEPATITIS C RNA QUANTITATIVE
HCV Quantitative Log: 5.68 log IU/mL — ABNORMAL HIGH
HCV RNA, PCR, QN: 483000 IU/mL — ABNORMAL HIGH

## 2021-03-01 LAB — HEPATITIS B SURFACE ANTIBODY,QUALITATIVE: Hep B S Ab: REACTIVE — AB

## 2021-03-01 LAB — HEPATITIS B SURFACE ANTIGEN: Hepatitis B Surface Ag: NONREACTIVE

## 2021-03-01 LAB — HEPATITIS C GENOTYPE

## 2021-03-14 ENCOUNTER — Ambulatory Visit (HOSPITAL_COMMUNITY): Payer: No Payment, Other | Admitting: *Deleted

## 2021-03-14 ENCOUNTER — Other Ambulatory Visit: Payer: Self-pay

## 2021-03-14 ENCOUNTER — Telehealth (HOSPITAL_COMMUNITY): Payer: Self-pay | Admitting: *Deleted

## 2021-03-14 ENCOUNTER — Encounter (HOSPITAL_COMMUNITY): Payer: Self-pay

## 2021-03-14 VITALS — BP 117/103 | HR 71 | Ht 71.0 in | Wt 250.0 lb

## 2021-03-14 DIAGNOSIS — F209 Schizophrenia, unspecified: Secondary | ICD-10-CM

## 2021-03-14 NOTE — Progress Notes (Signed)
In two months late for his monthly injection of Invega S 156 mg due to his lack of transportation and not having phone service. He got his injection today in his R deltoid without difficulty. Mom brought him today but he says he and his mom are not getting along well, she is always "grumpy" " no one wants to be around her". He also doesn't have phone service. His cousin is currently living with him to help pay the bills but he brings stress to the household with his behaviors. He is on dissability. Marc Hart is getting food stamps monthly of 250.00 which is helpful. Due to his many needs and [place] out [place] this [place] will refer him to a ACT team. This was discussed with him and he is in agreement. He denies any recent drug use. He does say his thinking is not good but did not elaborate. Good personal appearance, does have his finger nails painted black which I havent seen him do before. He has lost weight, mood is good. To return in one month for his next appt. Return in one month.

## 2021-03-14 NOTE — Telephone Encounter (Signed)
Called [facility] to make a referral to their ACCT but they are at capacity and are not taking referrals at this time. I am unaware of another ACT team that will serve him without insurance.

## 2021-03-20 ENCOUNTER — Telehealth: Payer: Self-pay

## 2021-03-20 ENCOUNTER — Other Ambulatory Visit: Payer: Self-pay

## 2021-03-20 ENCOUNTER — Ambulatory Visit (INDEPENDENT_AMBULATORY_CARE_PROVIDER_SITE_OTHER): Payer: Self-pay | Admitting: Family

## 2021-03-20 ENCOUNTER — Other Ambulatory Visit (HOSPITAL_COMMUNITY): Payer: Self-pay

## 2021-03-20 VITALS — BP 132/88 | HR 78 | Resp 16 | Ht 71.0 in | Wt 255.6 lb

## 2021-03-20 DIAGNOSIS — B182 Chronic viral hepatitis C: Secondary | ICD-10-CM

## 2021-03-20 MED ORDER — MAVYRET 100-40 MG PO TABS: 3.0000 | ORAL_TABLET | Freq: Every day | ORAL | 1 refills | Status: DC

## 2021-03-20 NOTE — Patient Instructions (Signed)
Nice to see you.  We will send in a prescription for Mavyret.   Pharmacy will call you once medication is available.  Let us know if you have any questions.  Next follow up 1 month after starting medication.   Have a great day and stay safe!

## 2021-03-20 NOTE — Assessment & Plan Note (Signed)
[name] has genotype 1 a chronic hepatitis C with initial viral load of 483,000 and fibrosis score of F1.  We reviewed lab work and discussed plan of care including financial assistance.  Start [place] once approved.  Met with pharmacy staff for completion of paperwork and filing of prescription.  Plan for follow-up in 1 month after starting medication

## 2021-03-20 NOTE — Progress Notes (Signed)
Subjective:    Patient ID: Marc Hart, male    DOB: 02-14-1991, 30 y.o.   MRN: [id number]  Chief Complaint  Patient presents with   Follow-up    Discussed community resources will give info to mother who is in room and patient gave permission to discuss with mom.     HPI:  Marc Hart is a 30 y.o. male with chronic hepatitis C last seen on 02/24/2021 with positive hepatitis C antibody with blood work confirming hepatitis C infection with viral load of 483,000, genotype Ia, and fibrosis score of F1.  Here today for follow-up.  [name] has been doing well since his last office visit.  No current symptoms including abdominal pain, nausea, vomiting, diarrhea, scleral icterus, or jaundice.  Eager to get started on medications.  Has questions about financial assistance   Allergies  Allergen Reactions   Amoxicillin Anaphylaxis    Has patient had a PCN reaction causing immediate rash, facial/tongue/throat swelling, SOB or lightheadedness with hypotension: Yes Has patient had a PCN reaction causing severe rash involving mucus membranes or skin necrosis: No Has patient had a PCN reaction that required hospitalization No Has patient had a PCN reaction occurring within the last 10 years: Yes If all of the above answers are "NO", then may proceed with Cephalosporin use.   Tylenol [Acetaminophen] Itching    Knees itched   Other Rash    Allergic reaction to coppertone suntan lotion      Outpatient Medications Prior to Visit  Medication Sig Dispense Refill   paliperidone (INVEGA SUSTENNA) 156 MG/ML SUSY injection Inject 1 mL (156 mg total) into the muscle every 28 (twenty-eight) days. Last administration 07/02/18. 1.2 mL 9   prazosin (MINIPRESS) 1 MG capsule Take 1 capsule (1 mg total) by mouth at bedtime. (Patient not taking: Reported on 03/20/2021) 30 capsule 0   risperiDONE (RISPERDAL M-TABS) 2 MG disintegrating tablet Take 1 tablet (2 mg total) by mouth 2 (two) times daily.  (Patient not taking: Reported on 03/20/2021) 60 tablet 0   sertraline (ZOLOFT) 50 MG tablet Take 1 tablet (50 mg total) by mouth daily. (Patient not taking: No sig reported) 30 tablet 2   traZODone (DESYREL) 50 MG tablet Take 1 tablet (50 mg total) by mouth at bedtime as needed for sleep. (Patient not taking: Reported on 03/20/2021) 30 tablet 0   Facility-Administered Medications Prior to Visit  Medication Dose Route Frequency Provider Last Rate Last Admin   paliperidone (INVEGA SUSTENNA) injection 156 mg  156 mg Intramuscular Q28 days Eulis Canner E, NP   156 mg at 03/14/21 1009     Past Medical History:  Diagnosis Date   Schizophrenia ([place])    Substance abuse ([place])      Past Surgical History:  Procedure Laterality Date   OTHER SURGICAL HISTORY     Ear Surgery        Review of Systems  Constitutional:  Negative for chills, diaphoresis, fatigue and fever.  Respiratory:  Negative for cough, chest tightness, shortness of breath and wheezing.   Cardiovascular:  Negative for chest pain.  Gastrointestinal:  Negative for abdominal distention, abdominal pain, constipation, diarrhea, nausea and vomiting.  Neurological:  Negative for weakness and headaches.  Hematological:  Does not bruise/bleed easily.     Objective:    BP 132/88   Pulse 78   Resp 16   Ht _0  (1.803 m)   Wt 255 lb 9.6 oz (115.9 kg)   SpO2 98%  BMI 35.65 kg/m  Nursing note and vital signs reviewed.  Physical Exam Constitutional:      General: He is not in acute distress.    Appearance: He is well-developed.  Cardiovascular:     Rate and Rhythm: Normal rate and regular rhythm.     Heart sounds: Normal heart sounds. No murmur heard.   No friction rub. No gallop.  Pulmonary:     Effort: Pulmonary effort is normal. No respiratory distress.     Breath sounds: Normal breath sounds. No wheezing or rales.  Chest:     Chest wall: No tenderness.  Abdominal:     General: Bowel sounds are normal. There  is no distension.     Palpations: Abdomen is soft. There is no mass.     Tenderness: There is no abdominal tenderness. There is no guarding or rebound.  Skin:    General: Skin is warm and dry.  Neurological:     Mental Status: He is alert and oriented to person, place, and time.  Psychiatric:        Behavior: Behavior normal.        Thought Content: Thought content normal.        Judgment: Judgment normal.     Depression screen [facility] 2/9 03/20/2021 02/24/2021  Decreased Interest 3 1  Down, Depressed, Hopeless 3 1  PHQ - 2 Score 6 2  Altered sleeping 3 1  Tired, decreased energy 2 0  Change in appetite 3 0  Feeling bad or failure about yourself  3 0  Trouble concentrating 3 1  Moving slowly or fidgety/restless 0 1  Suicidal thoughts 0 0  PHQ-9 Score 20 5  Difficult doing work/chores Very difficult -  Some encounter information is confidential and restricted. Go to Review Flowsheets activity to see all data.       Assessment & Plan:    Patient Active Problem List   Diagnosis Date Noted   Chronic hepatitis C without hepatic coma ([place]) 02/24/2021   Suicidal thoughts    Schizophrenia ([place]) 06/26/2018   Depression    Polysubstance abuse ([place])    Substance induced mood disorder ([place]) 09/25/2015   Alcohol dependence with uncomplicated withdrawal ([place]) 09/16/2015   Depressive disorder 09/12/2015   History of schizophrenia    Schizoaffective disorder ([place]) 12/18/2014     Problem List Items Addressed This Visit       Digestive   Chronic hepatitis C without hepatic coma ([place]) - Primary    [name] has genotype 1 a chronic hepatitis C with initial viral load of 483,000 and fibrosis score of F1.  We reviewed lab work and discussed plan of care including financial assistance.  Start [place] once approved.  Met with pharmacy staff for completion of paperwork and filing of prescription.  Plan for follow-up in 1 month after starting medication       Relevant Medications    Glecaprevir-Pibrentasvir (MAVYRET) 100-40 MG TABS     I am having Furkan D. Rayle start on [place]. I am also having him maintain his paliperidone, prazosin, risperiDONE, sertraline, and traZODone. We will continue to administer paliperidone.   Meds ordered this encounter  Medications   Glecaprevir-Pibrentasvir (MAVYRET) 100-40 MG TABS    Sig: Take 3 tablets by mouth daily. Take 3 tablets by mouth daily with a meal.    Dispense:  84 tablet    Refill:  1    Order Specific Question:   Supervising Provider    Answer:   Baxter Flattery,  CYNTHIA [[id number]]      Follow-up: One month after starting medication.    Terri Piedra, MSN, FNP-C Nurse Practitioner [facility] for Infectious Disease [place] number: [phone]

## 2021-03-20 NOTE — Telephone Encounter (Signed)
[facility] Patient Advocate Encounter  Completed and sent MYABBVIE application for Mavyret for this patient who is uninsured.    Patient assistance phone number for follow up is [phone].   This encounter will be updated until final determination.   Bently Morath, CPhT Specialty Pharmacy Patient Advocate [facility] Phone: [phone] Fax:  [phone]  

## 2021-03-21 ENCOUNTER — Telehealth: Payer: Self-pay

## 2021-03-21 NOTE — Telephone Encounter (Signed)
[facility] Patient Advocate Encounter  Completed and sent MYABBVIE application for MAVYRET  for this patient who is uninsured.    Patient is approved 03/21/21 through 09/21/21.  I will reach out to the patient to make sure he answer the phone to set up a delivery date with [facility].  I will set up a 1 month follow up once I get a delivery date.   Clearance Coots, CPhT Specialty Pharmacy Patient [facility] for Infectious Disease Phone: [phone] Fax:  [phone]

## 2021-03-31 ENCOUNTER — Telehealth: Payer: Self-pay

## 2021-03-31 NOTE — Telephone Encounter (Signed)
[facility] Patient Advocate Encounter   I have been unsuccsessful in reaching patient to be able to get a delivery date of Mavyret.  I have reached out to [facility] and they gave me a delivery date of 04/07/21 which medication will be delivered to the patient home.    We have tried multiple times without a response.  Clearance Coots, CPhT Specialty Pharmacy Patient [facility] for Infectious Disease Phone: [phone] Fax:  [phone]

## 2021-04-06 ENCOUNTER — Telehealth (HOSPITAL_COMMUNITY): Payer: Self-pay | Admitting: *Deleted

## 2021-04-06 NOTE — Telephone Encounter (Signed)
Called [facility] in [place] to check again to see if anything has changed with them to see if they are now taking referrals to the ACT program for this patinet who is a [employer] resident and this Clinical research associate feels he would benefit from the intensive services of an ACT team. They are not taking referrals and the person I spoke with indicated it may be some time before they are able to , they dont have the staff to increase their numbers now and with multiple changes happening in their agency unclear when they might have the staffing to increase their numbers. Will try to follow up in the new year.

## 2021-04-13 ENCOUNTER — Other Ambulatory Visit: Payer: Self-pay

## 2021-04-13 ENCOUNTER — Ambulatory Visit (INDEPENDENT_AMBULATORY_CARE_PROVIDER_SITE_OTHER): Payer: No Payment, Other | Admitting: *Deleted

## 2021-04-13 ENCOUNTER — Encounter (HOSPITAL_COMMUNITY): Payer: Self-pay | Admitting: Registered Nurse

## 2021-04-13 ENCOUNTER — Telehealth (HOSPITAL_COMMUNITY): Payer: Self-pay | Admitting: *Deleted

## 2021-04-13 ENCOUNTER — Encounter (HOSPITAL_COMMUNITY): Payer: No Payment, Other

## 2021-04-13 ENCOUNTER — Ambulatory Visit (INDEPENDENT_AMBULATORY_CARE_PROVIDER_SITE_OTHER): Payer: No Payment, Other | Admitting: Registered Nurse

## 2021-04-13 VITALS — BP 129/65 | HR 77 | Ht 71.0 in | Wt 252.0 lb

## 2021-04-13 DIAGNOSIS — G4700 Insomnia, unspecified: Secondary | ICD-10-CM

## 2021-04-13 DIAGNOSIS — F1210 Cannabis abuse, uncomplicated: Secondary | ICD-10-CM

## 2021-04-13 DIAGNOSIS — F1910 Other psychoactive substance abuse, uncomplicated: Secondary | ICD-10-CM

## 2021-04-13 DIAGNOSIS — F200 Paranoid schizophrenia: Secondary | ICD-10-CM | POA: Diagnosis not present

## 2021-04-13 DIAGNOSIS — F209 Schizophrenia, unspecified: Secondary | ICD-10-CM

## 2021-04-13 NOTE — Telephone Encounter (Signed)
Call yesterday from patients mom to inform me he has gotten a job without the help of VR, he got it on his own and has worked for 5 days now. He will not be able to be to an appt before 4 due to his work schedule and mom is making work his priority. She will be able to bring him thurs at 4 pm. He also needs to see a provider as it has been several months since he has. Also, I would like the NP to consider changing him to [place] so he can come q 3 months since he has difficulty getting here monthly due mostly to transportation, even before getting his current job. [place] this [place] but I havent been able to move him as there are limited services in [place] where he lives, [place] office not taking new patients nor is [facility] which I think he would benefit from.

## 2021-04-13 NOTE — Progress Notes (Signed)
BH MD/PA/NP OP Progress Note  04/13/2021 4:45 PM Marc Hart  MRN:  [id number]  Chief Complaint: Coming in for long Acting injection Marc Hart)  HPI: Marc Hart 30 y.o. male patient seen in office for follow up visit for medication management.  Patient reporting a psychiatric history of paranoid schizophrenia, PTSD, Insomnia, and anxiety.  Psychotropic medications currently managed with are Trazodone 50 mg nightly as needed, Prazosin 1 mg daily at bedtime, Risperdal M-tab 2 mg tice daily, Zoloft 50 mg daily, and Invega Sustenna 156 mg ever 28 days.  Patient repots medications are working well and he is tolerating without adverse reactions.  Patient currently living in [facility] and has had a difficulty time getting outpatient psychiatric service in his area.  Patient is also having a hard time getting to [facility] for follow up visits.  Patient reports that he is eating and sleeping without difficulty.  Patient denies suicidal/self-harm/homicidal ideation, psychosis, and paranoia.   Patient states he tries to live a normal life but "I don't get to enjoy the things I would like because I'm always trying to get to a doctors'  appointment, having to arrange transportation.  I'm trying to working part time."  Other than transportation issues patient states he has no other concerns.  Patient states he lives alone.  Patient reported history of polysubstance abuse but states he has stopped all illicit drugs for 3 years other than "smoking a little weed every now and then."  Patient states he rarely drinks alcohol.  Patient states recently started taking medications "for my liver.  I get a little anxious about it sometimes cause I'm scared it might make me sick."  States he has been tolerating the Mavyret with no adverse reactions thus far.       Discussed switching to Marc Hart which is given every 3 months.  Patient education on Trinza efficacy/side effects.  Patients' understanding voices and in  agreement with starting Marc Hart which would help reduces some of the stress of living in [facility], having difficulty getting transportation to [place] for medication management, Limited services in [place] many of the offices not taking new patients including [facility] (which would benefit patient greatly).  Changing long acting injectable would also allow patient to continue work and not miss time off.  Patient will follow up in 28 days for next Marc Hart ingection or will receive the Marc Hart as soon as available.       Visit Diagnosis:    ICD-10-CM   1. Paranoid schizophrenia (HCC)  F20.0     2. Insomnia, unspecified type  G47.00     3. Polysubstance abuse (HCC)  F19.10     4. Cannabis use disorder, mild, in controlled environment  F12.10       Past Psychiatric History: Paranoid schizophrenia, schizoaffective disorder, Anxiety, Depression, Polysubstance abuse, cannabis use disorder mild.    Past Medical History:  Past Medical History:  Diagnosis Date   Schizophrenia (HCC)    Substance abuse (HCC)     Past Surgical History:  Procedure Laterality Date   OTHER SURGICAL HISTORY     Ear Surgery     Family Psychiatric History:   Family History: History reviewed. No pertinent family history.  Social History:  Social History   Socioeconomic History   Marital status: Single    Spouse name: Not on file   Number of children: Not on file   Years of education: Not on file   Highest education level: Not on  file  Occupational History   Not on file  Tobacco Use   Smoking status: Some Days   Smokeless tobacco: Never  Vaping Use   Vaping Use: Every day  Substance and Sexual Activity   Alcohol use: Not Currently    Comment: 6-9 tallboy beers/day   Drug use: Not Currently    Types: Marijuana, IV, Cocaine, Methamphetamines    Comment: heroin 1gram/day   Sexual activity: Not on file  Other Topics Concern   Not on file  Social History Narrative   **  Merged History Encounter **       Social Determinants of Health   Financial Resource Strain: Not on file  Food Insecurity: Not on file  Transportation Needs: Not on file  Physical Activity: Not on file  Stress: Not on file  Social Connections: Not on file    Allergies:  Allergies  Allergen Reactions   Amoxicillin Anaphylaxis    Has patient had a PCN reaction causing immediate rash, facial/tongue/throat swelling, SOB or lightheadedness with hypotension: Yes Has patient had a PCN reaction causing severe rash involving mucus membranes or skin necrosis: No Has patient had a PCN reaction that required hospitalization No Has patient had a PCN reaction occurring within the last 10 years: Yes If all of the above answers are "NO", then may proceed with Cephalosporin use.   Tylenol [Acetaminophen] Itching    Knees itched   Other Rash    Allergic reaction to coppertone suntan lotion    Metabolic Disorder Labs: Lab Results  Component Value Date   HGBA1C 5.1 12/08/2020   MPG 99.67 12/08/2020   No results found for: PROLACTIN Lab Results  Component Value Date   CHOL 112 12/08/2020   TRIG 97 12/08/2020   HDL 27 (L) 12/08/2020   CHOLHDL 4.1 12/08/2020   VLDL 19 12/08/2020   LDLCALC 66 12/08/2020   Lab Results  Component Value Date   TSH 1.449 12/08/2020    Therapeutic Level Labs: No results found for: LITHIUM No results found for: VALPROATE No components found for:  CBMZ  Current Medications: Current Outpatient Medications  Medication Sig Dispense Refill   Glecaprevir-Pibrentasvir (MAVYRET) 100-40 MG TABS Take 3 tablets by mouth daily. Take 3 tablets by mouth daily with a meal. 84 tablet 1   paliperidone (INVEGA SUSTENNA) 156 MG/ML SUSY injection Inject 1 mL (156 mg total) into the muscle every 28 (twenty-eight) days. Last administration 07/02/18. 1.2 mL 9   prazosin (MINIPRESS) 1 MG capsule Take 1 capsule (1 mg total) by mouth at bedtime. (Patient not taking: Reported on  03/20/2021) 30 capsule 0   risperiDONE (RISPERDAL M-TABS) 2 MG disintegrating tablet Take 1 tablet (2 mg total) by mouth 2 (two) times daily. (Patient not taking: Reported on 03/20/2021) 60 tablet 0   sertraline (ZOLOFT) 50 MG tablet Take 1 tablet (50 mg total) by mouth daily. (Patient not taking: No sig reported) 30 tablet 2   traZODone (DESYREL) 50 MG tablet Take 1 tablet (50 mg total) by mouth at bedtime as needed for sleep. (Patient not taking: Reported on 03/20/2021) 30 tablet 0   Current Facility-Administered Medications  Medication Dose Route Frequency Provider Last Rate Last Admin   paliperidone (INVEGA SUSTENNA) injection 156 mg  156 mg Intramuscular Q28 days Toy Cookey E, NP   156 mg at 03/14/21 1009     Musculoskeletal: Strength & Muscle Tone: within normal limits Gait & Station: normal Patient leans: N/A  Psychiatric Specialty Exam: Review of Systems  Constitutional: Negative.  HENT: Negative.    Eyes: Negative.   Respiratory: Negative.    Cardiovascular: Negative.   Gastrointestinal: Negative.   Genitourinary: Negative.   Musculoskeletal: Negative.   Skin: Negative.   Neurological: Negative.   Hematological: Negative.   Psychiatric/Behavioral:  Negative for behavioral problems, confusion, dysphoric mood, hallucinations and self-injury. Agitation: Deneis at this time. Sleep disturbance: Reports sleeping fine. Suicidal ideas: Denies suicidal ideation and self harm.The patient is not hyperactive. Nervous/anxious: stable.       Reports he is doing well    There were no vitals taken for this visit.There is no height or weight on file to calculate BMI.  General Appearance: Casual and Clothing soiled from work  [employer] Contact:  Good  Speech:  Clear and Coherent and Normal Rate  Volume:  Normal  Mood:  Euthymic  Affect:  Appropriate and Congruent  Thought Process:  Coherent, Goal Directed, and Descriptions of Associations: Intact  Orientation:  Full (Time, Place, and  Person)  Thought Content: WDL and Logical   Suicidal Thoughts:  No  Homicidal Thoughts:  No  Memory:  Immediate;   Good Recent;   Good Remote;   Good  Judgement:  Intact  Insight:  Present  Psychomotor Activity:  Normal  Concentration:  Concentration: Good and Attention Span: Good  Recall:  Good  Fund of Knowledge: Good  Language: Good  Akathisia:  No  Handed:  Right  AIMS (if indicated): Not done, no indication or abnormal movements and no complaints from patient  Assets:  Communication Skills Desire for Improvement Housing Resilience Social Support  ADL's:  Intact  Cognition: WNL  Sleep:  Good   Screenings: AIMS    Flowsheet Row [facility]  AIMS Total Score 0 0      AUDIT    Flowsheet Row [facility]  Alcohol Use Disorder Identification Test Final Score (AUDIT) 3      GAD-7    Flowsheet Row [facility]  Total GAD-7 Score 5 17 17       PHQ2-9    Flowsheet Row Clinical [facility]20[facility]2020 [facility]  PHQ-2 Total Score 1 6 2 4 5   PHQ-9 Total Score -- 20 5 14 14       Flowsheet Row Clinical [facility]  C-SSRS RISK CATEGORY Low Risk High Risk High Risk       Assessment and Plan: Patient endorsing frustration with having to arrange transportation for medication follow-up visits and has been able [place] psychiatric [place].  Patient has been doing well since starting medications and feels that medications are working.  At this time patient is in agreement to switching [place]ust[place] to [place] when it becomes available.  He will receive the [place]  Lorelei Pont today and will call when Marc Hart is available.  Patient will continue all other medications as prescribed.    Paranoid Schizophrenia Continue Invega Sustenna 156 mg monthly until Marc Hart is approved; will then switch.  Will call to let know when available Continue Risperdal M-tab 2 mg twice daily PTSD Continue Prazosin 1 mg daily at bedtime Insomnia Continue Trazodone 50 mg daily at bedtime as needed for sleep For Depression /Anxiety Continue Zoloft 50 mg daily  No refills were needed at this time.  Follow UP:   Return in about 1 month (around 05/14/2021) for Medication management.  Denice Bors Sylvester Minton, NP 04/13/2021, 4:45 PM

## 2021-04-14 ENCOUNTER — Encounter (HOSPITAL_COMMUNITY): Payer: Self-pay

## 2021-04-14 NOTE — Progress Notes (Signed)
In late today for his shot and to see his new provider which is Shuvon Rankin NP. He is now working at a [employer] and he likes it and states he wants to stay there. He got this job on his own. He is relaxed today, offers no complaints. Discussed with provider changing his injection to [place] for convenience since he has trouble with his transportation and now that he works till 330 pm more issues with getting to his appts. He is onboard with this change. We will submit new PAP paperwork and start Trinza when we get his first shot.  Today he got his Invega S injection, 156 mg in his R DELTOID without difficulty. He is to return in one month.

## 2021-05-11 ENCOUNTER — Other Ambulatory Visit: Payer: Self-pay

## 2021-05-11 ENCOUNTER — Encounter (HOSPITAL_COMMUNITY): Payer: Self-pay

## 2021-05-11 ENCOUNTER — Ambulatory Visit (HOSPITAL_COMMUNITY): Payer: No Payment, Other | Admitting: *Deleted

## 2021-05-11 VITALS — BP 119/77 | HR 74 | Ht 71.0 in | Wt 256.0 lb

## 2021-05-11 DIAGNOSIS — F200 Paranoid schizophrenia: Secondary | ICD-10-CM

## 2021-05-11 MED ORDER — PALIPERIDONE PALMITATE ER 546 MG/1.75ML IM SUSY
546.0000 mg | PREFILLED_SYRINGE | INTRAMUSCULAR | Status: DC
  Administered 2021-05-11 – 2023-05-30 (×7): 561.6 mg via INTRAMUSCULAR

## 2021-05-11 NOTE — Progress Notes (Signed)
In for his scheduled Invega injection. Changed today to [place] as discussed with provider last month due to the difficulty he has getting to his appts from [facility] and his mom is his transportation but she works and cant get here till late. Tried to refer him to ACT team but not taking referrals at the program in [facility]. He reports today he is no longer working, states he has headaches a lot and couldn't handle work. Also, states he OD in [employer] the other day on opiates. States it was 3 weeks ago he last used. Discussed if he needed treatment, said no he was OK at this time. He does try to attend NA meetings on line but he says they are just awkward. He was asked if he wanted to revisit VR, said he would but to give him a month while he works on stabilizing his Hep C. He is getting care thru [facility]. He is to return here in 3 months.

## 2021-05-25 ENCOUNTER — Other Ambulatory Visit: Payer: Self-pay

## 2021-05-25 ENCOUNTER — Ambulatory Visit (INDEPENDENT_AMBULATORY_CARE_PROVIDER_SITE_OTHER): Payer: Self-pay | Admitting: Pharmacist

## 2021-05-25 DIAGNOSIS — Z23 Encounter for immunization: Secondary | ICD-10-CM

## 2021-05-25 DIAGNOSIS — B182 Chronic viral hepatitis C: Secondary | ICD-10-CM

## 2021-05-25 NOTE — Progress Notes (Signed)
05/25/2021  HPI: Marc Hart is a 30 y.o. male who presents to the [facility] pharmacy clinic for Hepatitis C follow-up.  Medication: Mavyret  Start Date: 04/12/2021  Hepatitis C Genotype: 1a  Fibrosis Score: F1  Hepatitis C RNA: 483,000 (02/24/21)  Patient Active Problem List   Diagnosis Date Noted   Insomnia 04/13/2021   Chronic hepatitis C without hepatic coma (HCC) 02/24/2021   Suicidal thoughts    Schizophrenia (HCC) 06/26/2018   Depression    Polysubstance abuse (HCC)    Substance induced mood disorder (HCC) 09/25/2015   Alcohol dependence with uncomplicated withdrawal (HCC) 09/16/2015   Depressive disorder 09/12/2015   History of schizophrenia    Schizoaffective disorder (HCC) 12/18/2014    Patient's Medications  New Prescriptions   No medications on file  Previous Medications   GLECAPREVIR-PIBRENTASVIR (MAVYRET) 100-40 MG TABS    Take 3 tablets by mouth daily. Take 3 tablets by mouth daily with a meal.   PALIPERIDONE (INVEGA SUSTENNA) 156 MG/ML SUSY INJECTION    Inject 1 mL (156 mg total) into the muscle every 28 (twenty-eight) days. Last administration 07/02/18.   PRAZOSIN (MINIPRESS) 1 MG CAPSULE    Take 1 capsule (1 mg total) by mouth at bedtime.   RISPERIDONE (RISPERDAL M-TABS) 2 MG DISINTEGRATING TABLET    Take 1 tablet (2 mg total) by mouth 2 (two) times daily.   SERTRALINE (ZOLOFT) 50 MG TABLET    Take 1 tablet (50 mg total) by mouth daily.   TRAZODONE (DESYREL) 50 MG TABLET    Take 1 tablet (50 mg total) by mouth at bedtime as needed for sleep.  Modified Medications   No medications on file  Discontinued Medications   No medications on file    Allergies: Allergies  Allergen Reactions   Amoxicillin Anaphylaxis    Has patient had a PCN reaction causing immediate rash, facial/tongue/throat swelling, SOB or lightheadedness with hypotension: Yes Has patient had a PCN reaction causing severe rash involving mucus membranes or skin necrosis: No Has patient  had a PCN reaction that required hospitalization No Has patient had a PCN reaction occurring within the last 10 years: Yes If all of the above answers are "NO", then may proceed with Cephalosporin use.   Tylenol [Acetaminophen] Itching    Knees itched   Other Rash    Allergic reaction to coppertone suntan lotion    Past Medical History: Past Medical History:  Diagnosis Date   Schizophrenia (HCC)    Substance abuse (HCC)     Social History: Social History   Socioeconomic History   Marital status: Single    Spouse name: Not on file   Number of children: Not on file   Years of education: Not on file   Highest education level: Not on file  Occupational History   Not on file  Tobacco Use   Smoking status: Some Days   Smokeless tobacco: Never  Vaping Use   Vaping Use: Every day  Substance and Sexual Activity   Alcohol use: Not Currently    Comment: 6-9 tallboy beers/day   Drug use: Not Currently    Types: Marijuana, IV, Cocaine, Methamphetamines    Comment: heroin 1gram/day   Sexual activity: Not on file  Other Topics Concern   Not on file  Social History Narrative   ** Merged History Encounter **       Social Determinants of Health   Financial Resource Strain: Not on file  Food Insecurity: Not on file  Transportation  Needs: Not on file  Physical Activity: Not on file  Stress: Not on file  Social Connections: Not on file    Labs: Hepatitis C Lab Results  Component Value Date   HCVGENOTYPE 1a 02/24/2021   HCVRNAPCRQN 483,000 (H) 02/24/2021   FIBROSTAGE F1 02/24/2021   Hepatitis B Lab Results  Component Value Date   HEPBSAB REACTIVE (A) 02/24/2021   HEPBSAG NON-REACTIVE 02/24/2021   Hepatitis A No results found for: HAV HIV Lab Results  Component Value Date   HIV NON-REACTIVE 02/24/2021   Lab Results  Component Value Date   CREATININE 0.82 12/06/2020   CREATININE 0.80 07/27/2018   CREATININE 0.97 07/22/2018   CREATININE 0.90 07/03/2018    CREATININE 1.01 07/02/2018   Lab Results  Component Value Date   AST 116 (H) 02/24/2021   AST 165 (H) 12/06/2020   AST 329 (H) 07/27/2018   ALT 243 (H) 02/24/2021   ALT 241 (H) 02/24/2021   ALT 299 (H) 12/06/2020   INR 1.0 02/24/2021    Assessment: Patient states he was having headaches during first 2 weeks of therapy as well as a toothache. Counseled him the toothache may have been contributing to the headache but headache is a symptom of Mavyret and resolution around 2 weeks is consistent with the course of treatment.   Thinks he started taking Mavyret around 8/10 but there was a delay in delivery, so therapy might have started a few days later.   Patient states he has moved on to his second month of treatment, but stated multiple different quantities of medication he had remaining. I asked the patient how he had been taking the medication and he stated he was taking 3 pills together daily with food and water and he hasn't missed any doses. I believe he has been adherent to his medication, he was just uncertain how much he had remaining.   Discussed seeing him in a month to make sure he was on track and to make sure his viral load was hopefully undetectable by that time, but counseled him about having a cure visit 3 months after completion of treatment with Mavyret, and he voiced understanding.   Patient also opted to to receive influenza vaccine, which was administered in clinic today.   Plan: - F/U CMET, HCV RNA  - F/U in clinic in 1 month (06/26/21) - Continue Mavyret x4 weeks  - Influenza vaccine IM x1   Jani Gravel, PharmD PGY-1 Acute Care Resident  05/25/2021 3:19 PM

## 2021-05-27 LAB — COMPREHENSIVE METABOLIC PANEL
AG Ratio: 1.7 (calc) (ref 1.0–2.5)
ALT: 17 U/L (ref 9–46)
AST: 20 U/L (ref 10–40)
Albumin: 4.5 g/dL (ref 3.6–5.1)
Alkaline phosphatase (APISO): 88 U/L (ref 36–130)
BUN: 14 mg/dL (ref 7–25)
CO2: 25 mmol/L (ref 20–32)
Calcium: 9.6 mg/dL (ref 8.6–10.3)
Chloride: 103 mmol/L (ref 98–110)
Creat: 1.05 mg/dL (ref 0.60–1.24)
Globulin: 2.7 g/dL (calc) (ref 1.9–3.7)
Glucose, Bld: 80 mg/dL (ref 65–99)
Potassium: 4.3 mmol/L (ref 3.5–5.3)
Sodium: 139 mmol/L (ref 135–146)
Total Bilirubin: 0.8 mg/dL (ref 0.2–1.2)
Total Protein: 7.2 g/dL (ref 6.1–8.1)

## 2021-05-27 LAB — HEPATITIS C RNA QUANTITATIVE
HCV Quantitative Log: <1.18 log IU/mL
HCV RNA, PCR, QN: <15 IU/mL

## 2021-06-08 ENCOUNTER — Ambulatory Visit (HOSPITAL_COMMUNITY): Payer: No Payment, Other

## 2021-06-26 ENCOUNTER — Other Ambulatory Visit: Payer: Self-pay

## 2021-06-26 ENCOUNTER — Ambulatory Visit (INDEPENDENT_AMBULATORY_CARE_PROVIDER_SITE_OTHER): Payer: Self-pay | Admitting: Pharmacist

## 2021-06-26 DIAGNOSIS — B182 Chronic viral hepatitis C: Secondary | ICD-10-CM

## 2021-06-26 NOTE — Progress Notes (Signed)
06/26/2021  HPI: Marc Hart is a 30 y.o. male who presents to the [facility] pharmacy clinic for Hepatitis C follow-up.  Medication: Mavyret x 8 weeks  Start Date: 04/12/21  Hepatitis C Genotype: 1a  Fibrosis Score: F1  Hepatitis C RNA: 483,000 (02/24/21) >> undetectable (05/25/21)  Patient Active Problem List   Diagnosis Date Noted   Insomnia 04/13/2021   Chronic hepatitis C without hepatic coma (HCC) 02/24/2021   Suicidal thoughts    Schizophrenia (HCC) 06/26/2018   Depression    Polysubstance abuse (HCC)    Substance induced mood disorder (HCC) 09/25/2015   Alcohol dependence with uncomplicated withdrawal (HCC) 09/16/2015   Depressive disorder 09/12/2015   History of schizophrenia    Schizoaffective disorder (HCC) 12/18/2014    Patient's Medications  New Prescriptions   No medications on file  Previous Medications   GLECAPREVIR-PIBRENTASVIR (MAVYRET) 100-40 MG TABS    Take 3 tablets by mouth daily. Take 3 tablets by mouth daily with a meal.   PALIPERIDONE (INVEGA SUSTENNA) 156 MG/ML SUSY INJECTION    Inject 1 mL (156 mg total) into the muscle every 28 (twenty-eight) days. Last administration 07/02/18.   PRAZOSIN (MINIPRESS) 1 MG CAPSULE    Take 1 capsule (1 mg total) by mouth at bedtime.   RISPERIDONE (RISPERDAL M-TABS) 2 MG DISINTEGRATING TABLET    Take 1 tablet (2 mg total) by mouth 2 (two) times daily.   SERTRALINE (ZOLOFT) 50 MG TABLET    Take 1 tablet (50 mg total) by mouth daily.   TRAZODONE (DESYREL) 50 MG TABLET    Take 1 tablet (50 mg total) by mouth at bedtime as needed for sleep.  Modified Medications   No medications on file  Discontinued Medications   No medications on file    Allergies: Allergies  Allergen Reactions   Amoxicillin Anaphylaxis    Has patient had a PCN reaction causing immediate rash, facial/tongue/throat swelling, SOB or lightheadedness with hypotension: Yes Has patient had a PCN reaction causing severe rash involving mucus membranes  or skin necrosis: No Has patient had a PCN reaction that required hospitalization No Has patient had a PCN reaction occurring within the last 10 years: Yes If all of the above answers are "NO", then may proceed with Cephalosporin use.   Tylenol [Acetaminophen] Itching    Knees itched   Other Rash    Allergic reaction to coppertone suntan lotion    Past Medical History: Past Medical History:  Diagnosis Date   Schizophrenia (HCC)    Substance abuse (HCC)     Social History: Social History   Socioeconomic History   Marital status: Single    Spouse name: Not on file   Number of children: Not on file   Years of education: Not on file   Highest education level: Not on file  Occupational History   Not on file  Tobacco Use   Smoking status: Some Days   Smokeless tobacco: Never  Vaping Use   Vaping Use: Every day  Substance and Sexual Activity   Alcohol use: Not Currently    Comment: 6-9 tallboy beers/day   Drug use: Not Currently    Types: Marijuana, IV, Cocaine, Methamphetamines    Comment: heroin 1gram/day   Sexual activity: Not on file  Other Topics Concern   Not on file  Social History Narrative   ** Merged History Encounter **       Social Determinants of Health   Financial Resource Strain: Not on file  Food  Insecurity: Not on file  Transportation Needs: Not on file  Physical Activity: Not on file  Stress: Not on file  Social Connections: Not on file    Labs: Hepatitis C Lab Results  Component Value Date   HCVGENOTYPE 1a 02/24/2021   HCVRNAPCRQN <15 05/25/2021   HCVRNAPCRQN 483,000 (H) 02/24/2021   FIBROSTAGE F1 02/24/2021   Hepatitis B Lab Results  Component Value Date   HEPBSAB REACTIVE (A) 02/24/2021   HEPBSAG NON-REACTIVE 02/24/2021   Hepatitis A No results found for: HAV HIV Lab Results  Component Value Date   HIV NON-REACTIVE 02/24/2021   Lab Results  Component Value Date   CREATININE 1.05 05/25/2021   CREATININE 0.82 12/06/2020    CREATININE 0.80 07/27/2018   CREATININE 0.97 07/22/2018   CREATININE 0.90 07/03/2018   Lab Results  Component Value Date   AST 20 05/25/2021   AST 116 (H) 02/24/2021   AST 165 (H) 12/06/2020   ALT 17 05/25/2021   ALT 243 (H) 02/24/2021   ALT 241 (H) 02/24/2021   INR 1.0 02/24/2021    Assessment: Marc Hart presents today for hepatitis C end of treatment visit. He finished 8 weeks of Mavyret. Denies missed doses or adverse effects. His LFTs are wnl and viral load undetectable as of last visit.    Plan: - HCV RNA today - SVR12 visit with Tammy Sours 09/25/21   Pervis Hocking, PharmD PGY2 Ambulatory Care Pharmacy Resident 06/26/2021 4:12 PM

## 2021-06-28 LAB — HEPATITIS C RNA QUANTITATIVE
HCV Quantitative Log: <1.18 log IU/mL
HCV RNA, PCR, QN: <15 IU/mL

## 2021-07-06 ENCOUNTER — Telehealth (HOSPITAL_COMMUNITY): Payer: Self-pay | Admitting: *Deleted

## 2021-07-06 NOTE — Telephone Encounter (Signed)
Called Marc Hart to check on him since giving him his first three month [place] shot. Left a message for him as he didn't pick up. His next appt is on 08/10/21.

## 2021-07-18 ENCOUNTER — Emergency Department (HOSPITAL_COMMUNITY)
Admission: EM | Admit: 2021-07-18 | Discharge: 2021-07-18 | Disposition: A | Discharge status: Home / Self Care | Payer: Medicaid Other | Attending: Emergency Medicine | Admitting: Emergency Medicine

## 2021-07-18 ENCOUNTER — Encounter (HOSPITAL_COMMUNITY): Payer: Self-pay | Admitting: Emergency Medicine

## 2021-07-18 DIAGNOSIS — F17200 Nicotine dependence, unspecified, uncomplicated: Secondary | ICD-10-CM | POA: Insufficient documentation

## 2021-07-18 DIAGNOSIS — F411 Generalized anxiety disorder: Secondary | ICD-10-CM

## 2021-07-18 DIAGNOSIS — F209 Schizophrenia, unspecified: Secondary | ICD-10-CM | POA: Insufficient documentation

## 2021-07-18 DIAGNOSIS — R443 Hallucinations, unspecified: Secondary | ICD-10-CM

## 2021-07-18 MED ORDER — TRAZODONE HCL 50 MG PO TABS: 50.0000 mg | ORAL_TABLET | Freq: Every evening | ORAL | 0 refills | Status: DC | PRN

## 2021-07-18 MED ORDER — PRAZOSIN HCL 1 MG PO CAPS: 1.0000 mg | ORAL_CAPSULE | Freq: Every day | ORAL | 0 refills | Status: DC

## 2021-07-18 MED ORDER — SERTRALINE HCL 50 MG PO TABS: 50.0000 mg | ORAL_TABLET | Freq: Every day | ORAL | 0 refills | Status: DC

## 2021-07-18 MED ORDER — RISPERIDONE 2 MG PO TBDP: 2.0000 mg | ORAL_TABLET | Freq: Two times a day (BID) | ORAL | 0 refills | Status: DC

## 2021-07-18 NOTE — ED Triage Notes (Signed)
Per pt, states he is a schizophrenic-has been having nightmares-having dreams about hurting other people-states his meds changed to a shot every three months instead of once a month-thinks it is not enough

## 2021-07-18 NOTE — ED Provider Notes (Signed)
[facility]-EMERGENCY DEPT Provider Note   CSN: [id number] Arrival date & time: 07/18/21  1721     History Chief Complaint  Patient presents with   nightmares     Marc Hart is a 30 y.o. male.  HPI Patient presents with hallucinations.  He has a history of schizophrenia.  He notes that he is recently switched from 1 medication regimen to another.  Now over the past 3 weeks or so he has developed auditory and visual hallucination.  When asked about suicidal ideation, homicidal ideation, patient denies either of these are present. He states that since switching his medication he has had worsening hallucinations, anxiousness about it. He has been unable to follow-up with his behavioral health facility yet.     Past Medical History:  Diagnosis Date   Schizophrenia (HCC)    Substance abuse [facility])     Patient Active Problem List   Diagnosis Date Noted   Insomnia 04/13/2021   Chronic hepatitis C without hepatic coma (HCC) 02/24/2021   Suicidal thoughts    Schizophrenia (HCC) 06/26/2018   Depression    Polysubstance abuse (HCC)    Substance induced mood disorder (HCC) 09/25/2015   Alcohol dependence with uncomplicated withdrawal (HCC) 09/16/2015   Depressive disorder 09/12/2015   History of schizophrenia    Schizoaffective disorder (HCC) 12/18/2014    Past Surgical History:  Procedure Laterality Date   OTHER SURGICAL HISTORY     Ear Surgery        No family history on file.  Social History   Tobacco Use   Smoking status: Some Days   Smokeless tobacco: Never  Vaping Use   Vaping Use: Every day  Substance Use Topics   Alcohol use: Not Currently    Comment: 6-9 tallboy beers/day   Drug use: Not Currently    Types: Marijuana, IV, Cocaine, Methamphetamines    Comment: heroin 1gram/day    Home Medications Prior to Admission medications   Medication Sig Start Date End Date Taking? Authorizing Provider  paliperidone (INVEGA SUSTENNA)  156 MG/ML SUSY injection Inject 1 mL (156 mg total) into the muscle every 28 (twenty-eight) days. Last administration 07/02/18. 11/28/20   Shanna Cisco, NP  prazosin (MINIPRESS) 1 MG capsule Take 1 capsule (1 mg total) by mouth at bedtime. 07/18/21   Gerhard Munch, MD  risperiDONE (RISPERDAL M-TABS) 2 MG disintegrating tablet Take 1 tablet (2 mg total) by mouth 2 (two) times daily. 07/18/21   Gerhard Munch, MD  sertraline (ZOLOFT) 50 MG tablet Take 1 tablet (50 mg total) by mouth daily. 07/18/21   Gerhard Munch, MD  traZODone (DESYREL) 50 MG tablet Take 1 tablet (50 mg total) by mouth at bedtime as needed for sleep. 07/18/21   Gerhard Munch, MD    Allergies    Amoxicillin, Tylenol [acetaminophen], and Other  Review of Systems   Review of Systems  Constitutional:        Per HPI, otherwise negative  HENT:         Per HPI, otherwise negative  Respiratory:         Per HPI, otherwise negative  Cardiovascular:        Per HPI, otherwise negative  Gastrointestinal:  Negative for vomiting.  Endocrine:       Negative aside from HPI  Genitourinary:        Neg aside from HPI   Musculoskeletal:        Per HPI, otherwise negative  Skin: Negative.   Neurological:  Negative for syncope.  Psychiatric/Behavioral:  Positive for hallucinations. Negative for suicidal ideas. The patient is nervous/anxious.    Physical Exam Updated Vital Signs BP 122/86 (BP Location: Right Arm)   Pulse 89   Temp 98 F (36.7 C) (Oral)   Resp 18   SpO2 100%   Physical Exam Vitals and nursing note reviewed.  Constitutional:      General: He is not in acute distress.    Appearance: He is well-developed.  HENT:     Head: Normocephalic and atraumatic.  Eyes:     Conjunctiva/sclera: Conjunctivae normal.  Cardiovascular:     Rate and Rhythm: Normal rate and regular rhythm.  Pulmonary:     Effort: Pulmonary effort is normal. No respiratory distress.     Breath sounds: No stridor.  Abdominal:      General: There is no distension.  Skin:    General: Skin is warm and dry.  Neurological:     Mental Status: He is alert and oriented to person, place, and time.  Psychiatric:        Behavior: Behavior normal.    ED Results / Procedures / Treatments   Labs (all labs ordered are listed, but only abnormal results are displayed) Labs Reviewed - No data to display  EKG None  Radiology No results found.  Procedures Procedures   Medications Ordered in ED Medications - No data to display  ED Course  I have reviewed the triage vital signs and the nursing notes.  Pertinent labs & imaging results that were available during my care of the patient were reviewed by me and considered in my medical decision making (see chart for details).  Adult male with schizophrenia presents with recurrent now persistent hallucinations.  He is otherwise awake, alert, speaking clearly, oriented appropriately, no indication for advanced imaging.  Patient is not suicidal or homicidal, no negation for admission.  With suspicion for medication changes contributing to his recurrence of hallucination we discussed option of reinitiation of prior medication regimen, close outpatient follow-up behavioral health.  Patient is amenable to this, was started and he was discharged in stable condition. Final Clinical Impression(s) / ED Diagnoses Final diagnoses:  Hallucinations    Rx / DC Orders ED Discharge Orders          Ordered    risperiDONE (RISPERDAL M-TABS) 2 MG disintegrating tablet  2 times daily        07/18/21 1752    sertraline (ZOLOFT) 50 MG tablet  Daily        07/18/21 1752    traZODone (DESYREL) 50 MG tablet  At bedtime PRN        07/18/21 1752    prazosin (MINIPRESS) 1 MG capsule  Daily at bedtime        07/18/21 1752             Gerhard Munch, MD 07/18/21 2315

## 2021-07-18 NOTE — Discharge Instructions (Addendum)
As discussed, you have been provided refill of your prior prescriptions, that seemed to control your symptoms well.  Please be sure to obtain these and schedule follow-up with your physician to discuss your medication regimen as soon as possible.  Return here for concerning changes in your condition.

## 2021-08-10 ENCOUNTER — Ambulatory Visit (HOSPITAL_COMMUNITY): Payer: No Payment, Other

## 2021-08-22 ENCOUNTER — Telehealth (HOSPITAL_COMMUNITY): Payer: Self-pay | Admitting: *Deleted

## 2021-08-22 NOTE — Telephone Encounter (Signed)
Called and left him a message to call me and make and appt to get his shot as he missed his appt on 12/8. He called back and left me a message that he would be here some time on thurs the 22 nd.

## 2021-08-24 ENCOUNTER — Other Ambulatory Visit: Payer: Self-pay

## 2021-08-24 ENCOUNTER — Encounter (HOSPITAL_COMMUNITY): Payer: Self-pay

## 2021-08-24 ENCOUNTER — Ambulatory Visit (HOSPITAL_COMMUNITY): Payer: No Payment, Other | Admitting: *Deleted

## 2021-08-24 ENCOUNTER — Ambulatory Visit (INDEPENDENT_AMBULATORY_CARE_PROVIDER_SITE_OTHER): Payer: No Payment, Other | Admitting: Registered Nurse

## 2021-08-24 ENCOUNTER — Encounter (HOSPITAL_COMMUNITY): Payer: Self-pay | Admitting: Registered Nurse

## 2021-08-24 VITALS — BP 114/72 | HR 66 | Ht 71.0 in | Wt 250.0 lb

## 2021-08-24 DIAGNOSIS — F333 Major depressive disorder, recurrent, severe with psychotic symptoms: Secondary | ICD-10-CM

## 2021-08-24 DIAGNOSIS — F200 Paranoid schizophrenia: Secondary | ICD-10-CM | POA: Diagnosis not present

## 2021-08-24 DIAGNOSIS — F4310 Post-traumatic stress disorder, unspecified: Secondary | ICD-10-CM

## 2021-08-24 DIAGNOSIS — G4700 Insomnia, unspecified: Secondary | ICD-10-CM | POA: Diagnosis not present

## 2021-08-24 MED ORDER — SERTRALINE HCL 25 MG PO TABS: 25.0000 mg | ORAL_TABLET | Freq: Every day | ORAL | 2 refills | Status: DC

## 2021-08-24 MED ORDER — PRAZOSIN HCL 1 MG PO CAPS: 1.0000 mg | ORAL_CAPSULE | Freq: Every day | ORAL | 2 refills | Status: DC

## 2021-08-24 MED ORDER — RISPERIDONE 2 MG PO TBDP
2.0000 mg | ORAL_TABLET | Freq: Two times a day (BID) | ORAL | 0 refills | Status: DC
  Filled 2021-08-24: qty 56, 28d supply, fill #0

## 2021-08-24 MED ORDER — PRAZOSIN HCL 1 MG PO CAPS
1.0000 mg | ORAL_CAPSULE | Freq: Every day | ORAL | 2 refills | Status: DC
  Filled 2021-08-24: qty 30, 30d supply, fill #0

## 2021-08-24 MED ORDER — RISPERIDONE 2 MG PO TBDP
2.0000 mg | ORAL_TABLET | Freq: Two times a day (BID) | ORAL | 0 refills | Status: DC
Start: 2021-08-24 — End: 2021-08-24

## 2021-08-24 MED ORDER — SERTRALINE HCL 25 MG PO TABS
25.0000 mg | ORAL_TABLET | Freq: Every day | ORAL | 2 refills | Status: DC
  Filled 2021-08-24: qty 30, 30d supply, fill #0

## 2021-08-24 NOTE — Progress Notes (Signed)
BH MD/PA/NP OP Progress Note  08/24/2021 2:36 PM Marc Hart  MRN:  [id number]  Chief Complaint: Coming in for long Acting injection Marc Hart Trinza 561 mg IM)  HPI: Marc Hart 30 y.o. male with psychiatric history of Paranoid schizophrenia, major depression, PTSD, anxiety, and insomnia.  He is seen in office today for medication management and Marc Hart follow up and injection.  He is treated with Marc Hart 561 mg every 3 months, Restarting Zoloft 25 mg daily, Risperdal 2 mg twice daily, and Minipress 1 mg daily at bed time.  Reports he is not currently taking the Trazodone.  Patient reports he feels that his medication is working that it really helps to control his mood a lot better but he does express some depression and anxiety rating depression 3/10 and anxiety 4/10 on scale of 0/none and 10/worst.  He is also endorses auditory hallucination.  "The voices never go away.  It's just they are getting really aggravating and cause me to be irritable at times.  I feel like I don't get a rest from them.  He reports that he hasn't been taking his Risperdal or his Zoloft.  Restarted the Zoloft and Risperdal.  He states that.  Patient reporting the reason he hasn't been taking medication is because he doesn't have the money to purchase them and no car to go pick them up.   Resources discussed and referred to [facility].  Patient states that he is eating and sleeping without difficulty.  Patient denies suicidal/self-harm/homicidal ideation, psychosis, and paranoia.   Patient also has a history of poly substance abuse but currently denies any illicit drug use at this time.     Visit Diagnosis:    ICD-10-CM   1. Paranoid schizophrenia ([place])  F20.0 risperiDONE (RISPERDAL M-TABS) 2 MG disintegrating tablet    DISCONTINUED: risperiDONE (RISPERDAL M-TABS) 2 MG disintegrating tablet    2. Insomnia, unspecified type  G47.00     3. PTSD (post-traumatic stress disorder)   F43.10 prazosin (MINIPRESS) 1 MG capsule    DISCONTINUED: prazosin (MINIPRESS) 1 MG capsule    4. Severe episode of recurrent major depressive disorder, with psychotic features ([place])  F33.3 sertraline (ZOLOFT) 25 MG tablet    DISCONTINUED: sertraline (ZOLOFT) 25 MG tablet      Past Psychiatric History: Paranoid schizophrenia, schizoaffective disorder, Anxiety, Depression, Polysubstance abuse, cannabis use disorder mild.    Past Medical History:  Past Medical History:  Diagnosis Date   Schizophrenia ([place])    Substance abuse ([place])     Past Surgical History:  Procedure Laterality Date   OTHER SURGICAL HISTORY     Ear Surgery     Family Psychiatric History: None reported  Family History: History reviewed. No pertinent family history.  Social History:  Social History   Socioeconomic History   Marital status: Single    Spouse name: Not on file   Number of children: Not on file   Years of education: Not on file   Highest education level: Not on file  Occupational History   Not on file  Tobacco Use   Smoking status: Some Days   Smokeless tobacco: Never  Vaping Use   Vaping Use: Every day  Substance and Sexual Activity   Alcohol use: Not Currently    Comment: 6-9 tallboy beers/day   Drug use: Not Currently    Types: Marijuana, IV, Cocaine, Methamphetamines    Comment: heroin 1gram/day   Sexual activity: Not on file  Other  Topics Concern   Not on file  Social History Narrative   ** Merged History Encounter **       Social Determinants of Health   Financial Resource Strain: Not on file  Food Insecurity: Not on file  Transportation Needs: Not on file  Physical Activity: Not on file  Stress: Not on file  Social Connections: Not on file    Allergies:  Allergies  Allergen Reactions   Amoxicillin Anaphylaxis    Has patient had a PCN reaction causing immediate rash, facial/tongue/throat swelling, SOB or lightheadedness with hypotension: Yes Has patient had a PCN  reaction causing severe rash involving mucus membranes or skin necrosis: No Has patient had a PCN reaction that required hospitalization No Has patient had a PCN reaction occurring within the last 10 years: Yes If all of the above answers are "NO", then may proceed with Cephalosporin use.   Tylenol [Acetaminophen] Itching    Knees itched   Other Rash    Allergic reaction to coppertone suntan lotion    Metabolic Disorder Labs: Lab Results  Component Value Date   HGBA1C 5.1 12/08/2020   MPG 99.67 12/08/2020   No results found for: PROLACTIN Lab Results  Component Value Date   CHOL 112 12/08/2020   TRIG 97 12/08/2020   HDL 27 (L) 12/08/2020   CHOLHDL 4.1 12/08/2020   VLDL 19 12/08/2020   LDLCALC 66 12/08/2020   Lab Results  Component Value Date   TSH 1.449 12/08/2020    Therapeutic Level Labs: No results found for: LITHIUM No results found for: VALPROATE No components found for:  CBMZ  Current Medications: Current Outpatient Medications  Medication Sig Dispense Refill   prazosin (MINIPRESS) 1 MG capsule Take 1 capsule (1 mg total) by mouth at bedtime. 30 capsule 2   risperiDONE (RISPERDAL M-TABS) 2 MG disintegrating tablet Take 1 tablet (2 mg total) by mouth 2 (two) times daily. 60 tablet 0   sertraline (ZOLOFT) 25 MG tablet Take 1 tablet (25 mg total) by mouth daily. 30 tablet 2   traZODone (DESYREL) 50 MG tablet Take 1 tablet (50 mg total) by mouth at bedtime as needed for sleep. 30 tablet 0   Current Facility-Administered Medications  Medication Dose Route Frequency Provider Last Rate Last Admin   Paliperidone Palmitate ER SUSY 561.6 mg  561.6 mg Intramuscular Q28 days Eulis Canner E, NP   561.6 mg at 08/24/21 1209     Musculoskeletal: Strength & Muscle Tone: within normal limits Gait & Station: normal Patient leans: N/A  Psychiatric Specialty Exam: Review of Systems  Constitutional: Negative.   HENT: Negative.    Eyes: Negative.   Respiratory:  Negative.    Cardiovascular: Negative.   Gastrointestinal: Negative.   Genitourinary: Negative.   Musculoskeletal: Negative.   Skin: Negative.   Neurological: Negative.   Hematological: Negative.   Psychiatric/Behavioral:  Positive for hallucinations. Negative for behavioral problems, confusion, dysphoric mood, self-injury and sleep disturbance (Reports sleeping fine). Agitation: At times related to auditory hallucinations. Suicidal ideas: Denies suicidal ideation and self harm.The patient is nervous/anxious. The patient is not hyperactive.        Reports the auditory hallucinations are aggravating     There were no vitals taken for this visit.There is no height or weight on file to calculate BMI.  General Appearance: Casual and Appropriate  Eye Contact:  Good  Speech:  Clear and Coherent and Normal Rate  Volume:  Normal  Mood:  Depressed  Affect:  Congruent  Thought Process:  Coherent, Goal Directed, and Descriptions of Associations: Intact  Orientation:  Full (Time, Place, and Person)  Thought Content: WDL and Logical   Suicidal Thoughts:  No  Homicidal Thoughts:  No  Memory:  Immediate;   Good Recent;   Good Remote;   Good  Judgement:  Intact  Insight:  Present  Psychomotor Activity:  Normal  Concentration:  Concentration: Good and Attention Span: Good  Recall:  Good  Fund of Knowledge: Good  Language: Good  Akathisia:  No  Handed:  Right  AIMS (if indicated): Done  Assets:  Communication Skills Desire for Improvement Housing Resilience Social Support  ADL's:  Intact  Cognition: WNL  Sleep:  Good   Screenings: AIMS    Flowsheet Row [facility] (Dis[facility]  AIMS Total Score 0 0      AUDIT    Flowsheet Row [facility]  Alcohol Use Disorder  Identification Test Final Score (AUDIT) 3      GAD-7    Flowsheet Row Office Visit from 08/24/2021 in [facility] Clinical Support from 04/13/2021 in [facility] Video Visit from 11/28/2020 in [facility] Clinical Support from 08/09/2020 in [facility]  Total GAD-7 Score 6 5 17 17       PHQ2-9    Flowsheet Row Office Visit from 08/24/2021 in [facility] Clinical Support from 04/13/2021 in [facility] Office Visit from 03/20/2021 in [facility] Office Visit from 02/24/2021 in [facility] for Infectious Disease Video Visit from 11/28/2020 in [facility]  PHQ-2 Total Score 2 1 6 2 4   PHQ-9 Total Score 3 -- 20 5 14       Flowsheet Row Office Visit from 08/24/2021 in [facility] ED from 07/18/2021 in [facility] Clinical Support from 04/13/2021 in [facility]  C-SSRS RISK CATEGORY No Risk No Risk Low Risk      Assessment and Plan: Jaliel Asmus reports that medications are effective and managing mood better.  During visit patient is sitting up right in chair, not noted distress.  He is alert/oriented x 4, calm and cooperative.  His mood is depressed with affect.  He spoke in a clear tone at moderate volume, and normal pace, with good eye contact.  His thought process is coherent and relevant.  Myking is endorsing auditory hallucinations but does deny delusional thinking.  There is no indication that he is currently responding to internal/external stimuli or experiencing delusional thought content other than his report of having auditory hallucinations.  He is also endorsing depression and anxiety.  He denies suicidal/self-harm/homicidal ideation, and paranoia.   Restarted Zoloft, Minipress, and Risperdal.    1. Paranoid schizophrenia (HCC) - risperiDONE (RISPERDAL M-TABS) 2 MG disintegrating tablet; Take 1 tablet (2 mg total) by mouth 2 (two) times daily.  Dispense: 60 tablet; Refill: 0  2. Insomnia, unspecified type  3. PTSD (post-traumatic stress disorder) - prazosin (MINIPRESS) 1 MG capsule; Take 1 capsule (1 mg total) by mouth at bedtime.  Dispense: 30 capsule; Refill: 2  4. Severe episode of recurrent major depressive disorder, with psychotic features (HCC) - sertraline (ZOLOFT) 25 MG tablet; Take 1 tablet (25 mg total) by  mouth daily.  Dispense: 30 tablet; Refill: 2    Follow up in 1 month   Nnamdi Dacus, NP 08/24/2021, 2:36 PM

## 2021-08-24 NOTE — Progress Notes (Signed)
In late for his every three month shot of Invega Trinza 546 mg, given today in his R DELTOID. 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 He has been working for 3 weeks. He denies recent drug use. He states he is hearing voices, seen by [facility] NP see her note for these details. Will see him back in three months. He would like to restart his po meds but no insurance and cant afford them. Will call them in to [facility] and they can mail them to him.

## 2021-08-25 ENCOUNTER — Other Ambulatory Visit: Payer: Self-pay

## 2021-08-29 ENCOUNTER — Other Ambulatory Visit: Payer: Self-pay

## 2021-09-01 ENCOUNTER — Other Ambulatory Visit: Payer: Self-pay

## 2021-09-04 ENCOUNTER — Other Ambulatory Visit: Payer: Self-pay

## 2021-09-05 ENCOUNTER — Other Ambulatory Visit: Payer: Self-pay

## 2021-09-25 ENCOUNTER — Ambulatory Visit (INDEPENDENT_AMBULATORY_CARE_PROVIDER_SITE_OTHER): Payer: Self-pay | Admitting: Family

## 2021-09-25 ENCOUNTER — Other Ambulatory Visit: Payer: Self-pay

## 2021-09-25 ENCOUNTER — Encounter: Payer: Self-pay | Admitting: Family

## 2021-09-25 VITALS — BP 134/84 | HR 78 | Resp 16 | Ht 71.0 in | Wt 255.1 lb

## 2021-09-25 DIAGNOSIS — B182 Chronic viral hepatitis C: Secondary | ICD-10-CM

## 2021-09-25 NOTE — Patient Instructions (Signed)
Nice to see you.  We will check your lab work today.  No further treatment is necessary if the testing is negative.  If screening needed in the future please check viral load or Hepatitis C RNA level.   Follow up with ID pending lab work results as needed   Have a great day!

## 2021-09-25 NOTE — Progress Notes (Signed)
Subjective:    Patient ID: Marc Hart, male    DOB: March 22, 1991, 31 y.o.   MRN: [id number]  Chief Complaint  Patient presents with   Follow-up    HPI:  Marc Hart is a 31 y.o. male with Chronic Hepatitis C last seen on 06/26/21 by Magda Kiel, PharmD, CPP for end of treatment visit having completed 8 weeks of [place] for Gent[place]pe 1a chronic hepatitis C with fibrosis score of F1. Hepatitis C RNA level at the time was undetectable. Here today for SVR12/cure visit.   Mr. Schnick has been doing well since his last office visit with no new concerns/complaints. Getting ready to start a new job. Denies any abdominal pain, nausea, vomiting, fatigue, scleral icterus or jaundice.    Allergies  Allergen Reactions   Amoxicillin Anaphylaxis    Has patient had a PCN reaction causing immediate rash, facial/tongue/throat swelling, SOB or lightheadedness with hypotension: Yes Has patient had a PCN reaction causing severe rash involving mucus membranes or skin necrosis: No Has patient had a PCN reaction that required hospitalization No Has patient had a PCN reaction occurring within the last 10 years: Yes If all of the above answers are "NO", then may proceed with Cephalosporin use.   Tylenol [Acetaminophen] Itching    Knees itched   Other Rash    Allergic reaction to coppertone suntan lotion      Outpatient Medications Prior to Visit  Medication Sig Dispense Refill   prazosin (MINIPRESS) 1 MG capsule Take 1 capsule (1 mg total) by mouth at bedtime. 30 capsule 2   risperiDONE (RISPERDAL M-TABS) 2 MG disintegrating tablet Take 1 tablet (2 mg total) by mouth 2 (two) times daily. 60 tablet 0   sertraline (ZOLOFT) 25 MG tablet Take 1 tablet (25 mg total) by mouth daily. 30 tablet 2   traZODone (DESYREL) 50 MG tablet Take 1 tablet (50 mg total) by mouth at bedtime as needed for sleep. 30 tablet 0   Facility-Administered Medications Prior to Visit  Medication Dose Route  Frequency Provider Last Rate Last Admin   Paliperidone Palmitate ER SUSY 561.6 mg  561.6 mg Intramuscular Q28 days Eulis Canner E, NP   561.6 mg at 08/24/21 1209     Past Medical History:  Diagnosis Date   Schizophrenia ([place])    Substance abuse ([place])      Past Surgical History:  Procedure Laterality Date   OTHER SURGICAL HISTORY     Ear Surgery        Review of Systems  Constitutional:  Negative for chills, diaphoresis, fatigue and fever.  Respiratory:  Negative for cough, chest tightness, shortness of breath and wheezing.   Cardiovascular:  Negative for chest pain.  Gastrointestinal:  Negative for abdominal distention, abdominal pain, constipation, diarrhea, nausea and vomiting.  Neurological:  Negative for weakness and headaches.  Hematological:  Does not bruise/bleed easily.     Objective:    BP 134/84    Pulse 78    Resp 16    Ht 5' 11"  (1.803 m)    Wt 255 lb 1.6 oz (115.7 kg)    SpO2 97%    BMI 35.58 kg/m  Nursing note and vital signs reviewed.  Physical Exam Constitutional:      General: He is not in acute distress.    Appearance: He is well-developed.  Cardiovascular:     Rate and Rhythm: Normal rate and regular rhythm.     Heart sounds: Normal heart sounds. No murmur heard.  No friction rub. No gallop.  Pulmonary:     Effort: Pulmonary effort is normal. No respiratory distress.     Breath sounds: Normal breath sounds. No wheezing or rales.  Chest:     Chest wall: No tenderness.  Abdominal:     General: Bowel sounds are normal. There is no distension.     Palpations: Abdomen is soft. There is no mass.     Tenderness: There is no abdominal tenderness. There is no guarding or rebound.  Skin:    General: Skin is warm and dry.  Neurological:     Mental Status: He is alert and oriented to person, place, and time.  Psychiatric:        Behavior: Behavior normal.        Thought Content: Thought content normal.        Judgment: Judgment normal.      Depression screen [name] 2/9 09/25/2021 03/20/2021 02/24/2021  Decreased Interest 0 3 1  Down, Depressed, Hopeless 0 3 1  PHQ - 2 Score 0 6 2  Altered sleeping - 3 1  Tired, decreased energy - 2 0  Change in appetite - 3 0  Feeling bad or failure about yourself  - 3 0  Trouble concentrating - 3 1  Moving slowly or fidgety/restless - 0 1  Suicidal thoughts - 0 0  PHQ-9 Score - 20 5  Difficult doing work/chores - Very difficult -  Some encounter information is confidential and restricted. Go to Review Flowsheets activity to see all data.       Assessment & Plan:    Patient Active Problem List   Diagnosis Date Noted   Insomnia 04/13/2021   Chronic hepatitis C without hepatic coma ([place]) 02/24/2021   Suicidal thoughts    Schizophrenia ([place]) 06/26/2018   Depression    Polysubstance abuse ([place])    Substance induced mood disorder ([place]) 09/25/2015   Alcohol dependence with uncomplicated withdrawal ([place]) 09/16/2015   Depressive disorder 09/12/2015   History of schizophrenia    Schizoaffective disorder ([place]) 12/18/2014     Problem List Items Addressed This Visit       Digestive   Chronic hepatitis C without hepatic coma ([place]) - Primary    Mr. Cheek has completed 8 weeks of Mavyret with no adverse side effects or missed doses. Reviewed previous lab work and discussed plan of care. Check Hepatitis C RNA level and CMET.  No further screening for HCC necessary with low risk of fibrosis. If future screening is indicated will need Hepatitis C RNA level as Hepatitis C antibody will always be positive. Follow up with ID pending lab work results as needed.       Relevant Orders   Hepatitis C RNA quantitative   Comp Met (CMET)     I am having Marc Hart maintain his traZODone, risperiDONE, prazosin, and sertraline. We will continue to administer Paliperidone Palmitate ER.   Follow-up: As needed pending lab work    Terri Piedra, MSN, [place] for [place] number: [phone]

## 2021-09-25 NOTE — Assessment & Plan Note (Signed)
[name] has completed 8 weeks of Mavyret with no adverse side effects or missed doses. Reviewed previous lab work and discussed plan of care. Check Hepatitis C RNA level and CMET.  No further screening for HCC necessary with low risk of fibrosis. If future screening is indicated will need Hepatitis C RNA level as Hepatitis C antibody will always be positive. Follow up with ID pending lab work results as needed.

## 2021-09-27 LAB — HEPATITIS C RNA QUANTITATIVE
HCV Quantitative Log: <1.18 {Log_IU}/mL
HCV RNA, PCR, QN: <15 [IU]/mL

## 2021-09-27 LAB — COMPREHENSIVE METABOLIC PANEL WITH GFR
AG Ratio: 1.7 (calc) (ref 1.0–2.5)
ALT: 14 U/L (ref 9–46)
AST: 17 U/L (ref 10–40)
Albumin: 4.2 g/dL (ref 3.6–5.1)
Alkaline phosphatase (APISO): 85 U/L (ref 36–130)
BUN: 11 mg/dL (ref 7–25)
CO2: 25 mmol/L (ref 20–32)
Calcium: 9.4 mg/dL (ref 8.6–10.3)
Chloride: 104 mmol/L (ref 98–110)
Creat: 0.91 mg/dL (ref 0.60–1.24)
Globulin: 2.5 g/dL (ref 1.9–3.7)
Glucose, Bld: 84 mg/dL (ref 65–99)
Potassium: 3.9 mmol/L (ref 3.5–5.3)
Sodium: 138 mmol/L (ref 135–146)
Total Bilirubin: 0.9 mg/dL (ref 0.2–1.2)
Total Protein: 6.7 g/dL (ref 6.1–8.1)

## 2021-11-23 ENCOUNTER — Ambulatory Visit (INDEPENDENT_AMBULATORY_CARE_PROVIDER_SITE_OTHER): Payer: No Payment, Other | Admitting: Registered Nurse

## 2021-11-23 ENCOUNTER — Ambulatory Visit (INDEPENDENT_AMBULATORY_CARE_PROVIDER_SITE_OTHER): Payer: No Payment, Other | Admitting: *Deleted

## 2021-11-23 ENCOUNTER — Other Ambulatory Visit: Payer: Self-pay

## 2021-11-23 ENCOUNTER — Encounter (HOSPITAL_COMMUNITY): Payer: Self-pay | Admitting: Registered Nurse

## 2021-11-23 VITALS — BP 132/71 | HR 81 | Ht 72.0 in | Wt 248.0 lb

## 2021-11-23 DIAGNOSIS — F209 Schizophrenia, unspecified: Secondary | ICD-10-CM

## 2021-11-23 DIAGNOSIS — F32A Depression, unspecified: Secondary | ICD-10-CM

## 2021-11-23 DIAGNOSIS — F4310 Post-traumatic stress disorder, unspecified: Secondary | ICD-10-CM | POA: Insufficient documentation

## 2021-11-23 DIAGNOSIS — G4700 Insomnia, unspecified: Secondary | ICD-10-CM | POA: Diagnosis not present

## 2021-11-23 DIAGNOSIS — F200 Paranoid schizophrenia: Secondary | ICD-10-CM

## 2021-11-23 MED ORDER — PALIPERIDONE PALMITATE ER 546 MG/1.75ML IM SUSY
546.0000 mg | PREFILLED_SYRINGE | Freq: Once | INTRAMUSCULAR | Status: AC
  Administered 2021-11-23: 561.6 mg via INTRAMUSCULAR

## 2021-11-23 NOTE — Progress Notes (Signed)
BH MD/PA/NP OP Progress Note ? ?11/23/2021 1:43 PM ?Marc Hart  ?MRN:  [id number] ? ?Chief Complaint:  ?Chief Complaint   ?Follow up Marc Hart injection ?  ?Coming in for long Acting injection Marc Hart 561 mg IM) ? ?HPI: Marc Hart 31 y.o. male with psychiatric history of Paranoid schizophrenia, major depression, PTSD, anxiety, and insomnia.  He is in office to day for follow up of long acting injectable.  Mental health is currently managed with Invega Hart 561 mg monthly.  He reports he is no longer taking Trazodone for sleep, Minipress for PTSD, and Zoloft for depression.  He reports that his mental health is stable and doesn't feel he needs to restart medications. States that the Marc Hart is working well.  He denies depression, anxiety, and mood instability.  Patient also denies suicidal/self-harm/homicidal ideation, psychosis, and paranoia.  When asked about his auditory hallucinations her reports he is no longer hearing voices.   ? ?Visit Diagnosis:  ?  ICD-10-CM   ?1. Schizophrenia, unspecified type (HCC)  F20.9 Paliperidone Palmitate ER SUSY 561.6 mg  ?  ?2. Depressive disorder  F32.A   ?  ?3. PTSD (post-traumatic stress disorder)  F43.10   ?  ?4. Insomnia, unspecified type  G47.00   ?  ? ? ?Past Psychiatric History: Paranoid schizophrenia, schizoaffective disorder, Anxiety, Depression, Polysubstance abuse, cannabis use disorder mild.   ? ?Past Medical History:  ?Past Medical History:  ?Diagnosis Date  ? Schizophrenia (HCC)   ? Substance abuse (HCC)   ?  ?Past Surgical History:  ?Procedure Laterality Date  ? OTHER SURGICAL HISTORY    ? Ear Surgery   ? ? ?Family Psychiatric History: None reported ? ?Family History: History reviewed. No pertinent family history. ? ?Social History:  ?Social History  ? ?Socioeconomic History  ? Marital status: Single  ?  Spouse name: Not on file  ? Number of children: Not on file  ? Years of education: Not on file  ? Highest education level: Not on  file  ?Occupational History  ? Not on file  ?Tobacco Use  ? Smoking status: Some Days  ? Smokeless tobacco: Never  ?Vaping Use  ? Vaping Use: Every day  ?Substance and Sexual Activity  ? Alcohol use: Not Currently  ?  Comment: 6-9 tallboy beers/day  ? Drug use: Not Currently  ?  Types: Marijuana, IV, Cocaine, Methamphetamines  ?  Comment: heroin 1gram/day  ? Sexual activity: Not on file  ?Other Topics Concern  ? Not on file  ?Social History Narrative  ? ** Merged History Encounter **  ?    ? ?Social Determinants of Health  ? ?Financial Resource Strain: Not on file  ?Food Insecurity: Not on file  ?Transportation Needs: Not on file  ?Physical Activity: Not on file  ?Stress: Not on file  ?Social Connections: Not on file  ? ? ?Allergies:  ?Allergies  ?Allergen Reactions  ? Amoxicillin Anaphylaxis  ?  Has patient had a PCN reaction causing immediate rash, facial/tongue/throat swelling, SOB or lightheadedness with hypotension: Yes ?Has patient had a PCN reaction causing severe rash involving mucus membranes or skin necrosis: No ?Has patient had a PCN reaction that required hospitalization No ?Has patient had a PCN reaction occurring within the last 10 years: Yes ?If all of the above answers are "NO", then may proceed with Cephalosporin use.  ? Tylenol [Acetaminophen] Itching  ?  Knees itched  ? Other Rash  ?  Allergic reaction to coppertone suntan lotion  ? ? ?  Metabolic Disorder Labs: ?Lab Results  ?Component Value Date  ? HGBA1C 5.1 12/08/2020  ? MPG 99.67 12/08/2020  ? ?No results found for: PROLACTIN ?Lab Results  ?Component Value Date  ? CHOL 112 12/08/2020  ? TRIG 97 12/08/2020  ? HDL 27 (L) 12/08/2020  ? CHOLHDL 4.1 12/08/2020  ? VLDL 19 12/08/2020  ? LDLCALC 66 12/08/2020  ? ?Lab Results  ?Component Value Date  ? TSH 1.449 12/08/2020  ? ? ?Therapeutic Level Labs: ?No results found for: LITHIUM ?No results found for: VALPROATE ?No components found for:  CBMZ ? ?Current Medications: ?Scheduled Meds: ? Paliperidone  Palmitate ER  561.6 mg Intramuscular Q28 days  ? ? ?Musculoskeletal: ?Strength & Muscle Tone: within normal limits ?Gait & Station: normal ?Patient leans: N/A ? ?Psychiatric Specialty Exam: ?Review of Systems  ?Constitutional: Negative.   ?HENT: Negative.    ?Eyes: Negative.   ?Respiratory: Negative.    ?Cardiovascular: Negative.   ?Gastrointestinal: Negative.   ?Genitourinary: Negative.   ?Musculoskeletal: Negative.   ?Skin: Negative.   ?Neurological: Negative.   ?Hematological: Negative.   ?Psychiatric/Behavioral:  Negative for agitation, behavioral problems, confusion, dysphoric mood, hallucinations, self-injury and sleep disturbance (Reports sleeping fine). Suicidal ideas: Denies suicidal ideation and self harm.The patient is not nervous/anxious and is not hyperactive.   ?     Denies auditory hallucinations and that he is not having any problems with mood, depression, or anxiety.  Also states he is eating and sleeping without difficulty ?   ?There were no vitals taken for this visit.There is no height or weight on file to calculate BMI.  ?General Appearance: Casual and Appropriate  ?Eye Contact:  Good  ?Speech:  Clear and Coherent and Normal Rate  ?Volume:  Normal  ?Mood:  Euthymic  ?Affect:  Congruent  ?Thought Process:  Coherent, Goal Directed, and Descriptions of Associations: Intact  ?Orientation:  Full (Time, Place, and Person)  ?Thought Content: WDL and Logical   ?Suicidal Thoughts:  No  ?Homicidal Thoughts:  No  ?Memory:  Immediate;   Good ?Recent;   Good ?Remote;   Good  ?Judgement:  Intact  ?Insight:  Present  ?Psychomotor Activity:  Normal  ?Concentration:  Concentration: Good and Attention Span: Good  ?Recall:  Good  ?Fund of Knowledge: Good  ?Language: Good  ?Akathisia:  No  ?Handed:  Right  ?AIMS (if indicated): Done  ?Assets:  Communication Skills ?Desire for Improvement ?Housing ?Resilience ?Social Support  ?ADL's:  Intact  ?Cognition: WNL  ?Sleep:  Good  ? ?Screenings: ?AIMS   ? ?Flowsheet Row  [facility]  ?AIMS Total Score 0 0 0  ? ?  ? ?AUDIT   ? ?Flowsheet Row [facility]  ?Alcohol Use Disorder Identification Test Final Score (AUDIT) 3  ? ?  ? ?GAD-7   ? ?Flowsheet Row [facility]  ?Total GAD-7 Score 0 6 5 17 17   ? ?  ? ?PHQ2-9   ? ?Flowsheet Row [facility]  Office Visit from 09/25/2021 in [facility] Office Visit from 08/24/2021 in [facility] Clinical Support from 04/13/2021 in [facility] Office Visit from 03/20/2021 in [facility] for Infectious Disease  ?PHQ-2 Total Score 0 0 2 1 6   ?PHQ-9 Total Score -- -- 3 -- 20  ? ?  ? ?Flowsheet Row Office Visit from 11/23/2021 in [facility] Office Visit from 08/24/2021 in [facility] ED from 07/18/2021 in [facility]  ?C-SSRS RISK CATEGORY No Risk No Risk No Risk  ? ?  ? ?Assessment and Plan: Marc Hart reports that medications are effectively and managing his mental health.  During visit patient is sitting up right in chair and no distress noted.  He is alert/oriented x 4, calm and cooperative.  His mood euthymic and congruent with affect.   He  spoke in a clear tone at moderate volume, and normal pace, with good eye contact.  His thought process is coherent and relevant.  There was no indication that he was responding to internal/external stimuli o

## 2021-11-23 NOTE — Progress Notes (Signed)
Patient arrived for injection Paliperidone Palmitate ER SUSY 561.6 mg 1.8 mL  546 mg/1.75 mL (rounded to the nearest 0.1 mL) = 561.6 mg   Patient doing well & tolerated injection well in Left-Arm & [place] as Always 

## 2021-12-11 ENCOUNTER — Telehealth (HOSPITAL_COMMUNITY): Payer: Self-pay | Admitting: Psychiatry

## 2021-12-11 NOTE — Telephone Encounter (Signed)
Patient had visit March 2022 with Marc Cookey DNP. Pt had visit with Marc Rankin,NP 11/23/21. Presented 12/11/21 to the OP department requesting minipress and risperidone. Pt says he was given both at a recent hospitalization.  Offered appt with shot clinic provider tomorrow or Thursday. Pt wanted meds today.  Upon review, pt is a [place] resident and does not meet guidelines for [facility]-OP.  Offered information on walk-in availability at [facility] [place] [facility] pt became angry and left without an appt this week or info for [facility] in [place].

## 2022-02-22 ENCOUNTER — Encounter (HOSPITAL_COMMUNITY): Payer: Self-pay | Admitting: Registered Nurse

## 2022-02-22 ENCOUNTER — Ambulatory Visit (INDEPENDENT_AMBULATORY_CARE_PROVIDER_SITE_OTHER): Payer: No Payment, Other | Admitting: *Deleted

## 2022-02-22 ENCOUNTER — Encounter (HOSPITAL_COMMUNITY): Payer: Self-pay

## 2022-02-22 ENCOUNTER — Ambulatory Visit (INDEPENDENT_AMBULATORY_CARE_PROVIDER_SITE_OTHER): Payer: No Payment, Other | Admitting: Registered Nurse

## 2022-02-22 VITALS — BP 114/74 | HR 65 | Ht 72.0 in | Wt 240.0 lb

## 2022-02-22 DIAGNOSIS — F4310 Post-traumatic stress disorder, unspecified: Secondary | ICD-10-CM

## 2022-02-22 DIAGNOSIS — F200 Paranoid schizophrenia: Secondary | ICD-10-CM | POA: Diagnosis not present

## 2022-02-22 DIAGNOSIS — F411 Generalized anxiety disorder: Secondary | ICD-10-CM | POA: Diagnosis not present

## 2022-02-22 DIAGNOSIS — F1910 Other psychoactive substance abuse, uncomplicated: Secondary | ICD-10-CM

## 2022-02-22 DIAGNOSIS — F209 Schizophrenia, unspecified: Secondary | ICD-10-CM | POA: Diagnosis not present

## 2022-02-22 DIAGNOSIS — G4700 Insomnia, unspecified: Secondary | ICD-10-CM

## 2022-02-22 MED ORDER — INVEGA TRINZA 546 MG/1.75ML IM SUSY: 546.0000 mg | PREFILLED_SYRINGE | INTRAMUSCULAR | 1 refills | Status: DC

## 2022-02-22 NOTE — Progress Notes (Signed)
BH MD/PA/NP OP Progress Note  02/22/2022 1:46 PM Marc Hart  MRN:  [id number]  Chief Complaint:  Chief Complaint  Patient presents with   Follow up psychiatric evaluation and administration of long   HPI: Marc Hart 30 yr. male with psychiatric history of paranoid schizophrenia, major depression, PTSD, anxiety, and insomnia.  He is seen in office today for follow up psychiatric evaluation and administration of long acting injectable.  His mental health is currently managed with Invega Trinza 561 mg monthly.  He reports Eyvonne Mechanic continues to manage mental health with no adverse reactions.  He denies depression, anxiety, fluctuations in mood, abnormal movement, suicidal/self-harm/homicidal ideation, and paranoia; however, he does report that he has had a visual hallucination which occurs at night while in bed "I see a black blob on the floor.  He reports this has been happening for a while "but it is much better than it was before I started taking my medicine."  He reports that he feels that a week before it is time for his next injection that is when the symptoms occur.  Discussed coming in a week earlier for injection.  He agrees that coming in a week early would be helpful.  AIMS, PHQ 2 & 9, C-SSRS, and GAD 7 screenings conducted by provider see scores below. He reports eating and sleeping without difficulty.  He reports that he continues to smoke marijuana daily but he has not done any opioids in 6 months.  Education discussed about the affects of marijuana and mental health; understanding voiced.     Visit Diagnosis:    ICD-10-CM   1. Paranoid schizophrenia (HCC)  F20.0     2. Polysubstance abuse (HCC)  F19.10     3. Generalized anxiety disorder  F41.1     4. PTSD (post-traumatic stress disorder)  F43.10     5. Insomnia, unspecified type  G47.00       Past Psychiatric History: Paranoid schizophrenia, schizoaffective disorder, Anxiety, Depression, Polysubstance abuse,  cannabis use disorder mild.    Past Medical History:  Past Medical History:  Diagnosis Date   Schizophrenia (HCC)    Substance abuse (HCC)     Past Surgical History:  Procedure Laterality Date   OTHER SURGICAL HISTORY     Ear Surgery     Family Psychiatric History: None reported  Family History: History reviewed. No pertinent family history.  Social History:  Social History   Socioeconomic History   Marital status: Single    Spouse name: Not on file   Number of children: Not on file   Years of education: Not on file   Highest education level: Not on file  Occupational History   Not on file  Tobacco Use   Smoking status: Some Days   Smokeless tobacco: Never  Vaping Use   Vaping Use: Every day  Substance and Sexual Activity   Alcohol use: Not Currently    Comment: 6-9 tallboy beers/day   Drug use: Not Currently    Types: Marijuana, IV, Cocaine, Methamphetamines    Comment: heroin 1gram/day   Sexual activity: Not on file  Other Topics Concern   Not on file  Social History Narrative   ** Merged History Encounter **       Social Determinants of Health   Financial Resource Strain: Not on file  Food Insecurity: Not on file  Transportation Needs: Not on file  Physical Activity: Not on file  Stress: Not on file  Social Connections: Not on  file    Allergies:  Allergies  Allergen Reactions   Amoxicillin Anaphylaxis    Has patient had a PCN reaction causing immediate rash, facial/tongue/throat swelling, SOB or lightheadedness with hypotension: Yes Has patient had a PCN reaction causing severe rash involving mucus membranes or skin necrosis: No Has patient had a PCN reaction that required hospitalization No Has patient had a PCN reaction occurring within the last 10 years: Yes If all of the above answers are "NO", then may proceed with Cephalosporin use.   Tylenol [Acetaminophen] Itching    Knees itched   Other Rash    Allergic reaction to coppertone suntan  lotion    Metabolic Disorder Labs: Lab Results  Component Value Date   HGBA1C 5.1 12/08/2020   MPG 99.67 12/08/2020   No results found for: "PROLACTIN" Lab Results  Component Value Date   CHOL 112 12/08/2020   TRIG 97 12/08/2020   HDL 27 (L) 12/08/2020   CHOLHDL 4.1 12/08/2020   VLDL 19 12/08/2020   LDLCALC 66 12/08/2020   Lab Results  Component Value Date   TSH 1.449 12/08/2020    Therapeutic Level Labs: No results found for: "LITHIUM" No results found for: "VALPROATE" No results found for: "CBMZ"  Current Medications: No current outpatient medications on file.   Current Facility-Administered Medications  Medication Dose Route Frequency Provider Last Rate Last Admin   Paliperidone Palmitate ER SUSY 561.6 mg  561.6 mg Intramuscular Q28 days Toy Cookey E, NP   561.6 mg at 02/22/22 1337     Musculoskeletal: Strength & Muscle Tone: within normal limits Gait & Station: normal Patient leans: N/A  Psychiatric Specialty Exam: Review of Systems  Constitutional: Negative.   HENT: Negative.    Eyes: Negative.   Respiratory: Negative.    Cardiovascular: Negative.   Gastrointestinal: Negative.   Genitourinary: Negative.   Musculoskeletal: Negative.   Skin: Negative.   Neurological: Negative.   Hematological: Negative.   Psychiatric/Behavioral:  Negative for agitation, behavioral problems, dysphoric mood, self-injury, sleep disturbance and suicidal ideas. Hallucinations: reports visual hallucinations while in bed at night.Nervous/anxious: Stable.     There were no vitals taken for this visit.There is no height or weight on file to calculate BMI.  General Appearance: Casual  Eye Contact:  Good  Speech:  Clear and Coherent and Normal Rate  Volume:  Normal  Mood:  Euthymic  Affect:  Appropriate and Congruent  Thought Process:  Coherent, Goal Directed, and Descriptions of Associations: Intact  Orientation:  Full (Time, Place, and Person)  Thought Content:  Logical   Suicidal Thoughts:  No  Homicidal Thoughts:  No  Memory:  Immediate;   Good Recent;   Good Remote;   Good  Judgement:  Intact  Insight:  Present  Psychomotor Activity:  Normal  Concentration:  Concentration: Good and Attention Span: Good  Recall:  Good  Fund of Knowledge: Good  Language: Good  Akathisia:  No  Handed:  Right  AIMS (if indicated): done  Assets:  Communication Skills Desire for Improvement Financial Resources/Insurance Housing Physical Health Resilience Social Support Transportation  ADL's:  Intact  Cognition: WNL  Sleep:  Good   Screenings: AIMS    Flowsheet Row [facility] Office Visit from [facility]/23/2021 [facility]) [facility]  AIMS Total Score 0 0 0 0  AUDIT    Flowsheet Row [facility]  Alcohol Use Disorder Identification Test Final Score (AUDIT) 3      GAD-7    Flowsheet Row Office Visit from 02/22/2022 in [facility] Office Visit from 11/23/2021 in [facility] Office Visit from 08/24/2021 in [facility] Clinical Support from 04/13/2021 in [facility] Video Visit from 11/28/2020 in [facility]  Total GAD-7 Score 0 0 6 5 17       PHQ2-9    Flowsheet Row Office Visit from 02/22/2022 in [facility] Office Visit from 11/23/2021 in [facility] Office Visit from 09/25/2021 in [facility] Office Visit from 08/24/2021 in [facility] Clinical Support from 04/13/2021  in [facility]  PHQ-2 Total Score 0 0 0 2 1  PHQ-9 Total Score -- -- -- 3 --      Flowsheet Row Office Visit from 02/22/2022 in [facility] Office Visit from 11/23/2021 in [facility] Office Visit from 08/24/2021 in [facility]  C-SSRS RISK CATEGORY No Risk No Risk No Risk        Assessment and Plan: Emanuell Morina Charland appears to be doing well.  He reports that medications is effective and managing his mental health without any adverse reaction.  During visit he is dressed appropriate for weather.  He is sitting upright in chair with no noted distress.  He is alert/oriented x 4, calm/cooperative and mood is congruent with affect.  He spoke in a clear tone at moderate volume, and normal pace, with good eye contact.  His thought process is coherent and relevant; and there is no indication that he is currently responding to internal/external stimuli, or experiencing delusional thought content.  He did report he was having a visual hallucination while in bed at night.  He denies depression, anxiety, suicidal/self-harm/homicidal ideation, paranoia, and fluctuations in mood.   1. Paranoid schizophrenia (HCC)  2. Polysubstance abuse (HCC)  3. Generalized anxiety disorder  4. PTSD (post-traumatic stress disorder)  5. Insomnia, unspecified type   Collaboration of Care: Collaboration of Care: Medication Management AEB Administration of long acting injectable and refill  Meds ordered this encounter  Medications   Paliperidone Palmitate ER (INVEGA TRINZA) 546 MG/1.75ML injection    Sig: Inject 1.75 mLs (546 mg total) into the muscle every 3 (three) months.    Dispense:  1.8 mL    Refill:  1    Order Specific Question:   Supervising Provider    Answer:   [name] [[id number]]    Patient/Guardian was advised Release of Information must be obtained prior to any record release in order  to collaborate their care with an outside provider. Patient/Guardian was advised if they have not already done so to contact the registration department to sign all necessary forms in order for Nelly Rout to release information regarding their care.   Consent: Patient/Guardian gives verbal consent for treatment and assignment of benefits for services provided during this visit. Patient/Guardian expressed understanding and agreed to proceed.   Follow up in one month  Reiana Poteet, NP 02/22/2022, 1:46 PM

## 2022-02-22 NOTE — Progress Notes (Deleted)
In as planned for his every 3 month shot. 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 He is no longer getting food stamps, he is unsure why they stopped. Encouraged to follow up with DSS re them as he has little income. Mom brought him to his appt today but stayed out in the car. He states he is clean and doing well, but when asked about his thinking states not too good toward end of his shot. His next appt scheduled for 2 months and 3 weeks to help the shot cover his sx. He got his Trinza injection in his R DELTOID without issues.  

## 2022-02-22 NOTE — Progress Notes (Signed)
Patient arrived for his Injection Paliperidone Palmitate ER SUSY 561.6 mg 561.6 mg (rounded from 546 mg),  Intramuscular,

## 2022-02-22 NOTE — Progress Notes (Signed)
In as planned for his every 3 month shot. 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 He is no longer getting food stamps, he is unsure why they stopped. Encouraged to follow up with DSS re them as he has little income. Mom brought him to his appt today but stayed out in the car. He states he is clean and doing well, but when asked about his thinking states not too good toward end of his shot. His next appt scheduled for 2 months and 3 weeks to help the shot cover his sx. He got his Trinza injection in his R DELTOID without issues.

## 2022-05-17 ENCOUNTER — Encounter (HOSPITAL_COMMUNITY): Payer: Self-pay | Admitting: Student in an Organized Health Care Education/Training Program

## 2022-05-17 ENCOUNTER — Ambulatory Visit (INDEPENDENT_AMBULATORY_CARE_PROVIDER_SITE_OTHER): Payer: No Payment, Other | Admitting: Student in an Organized Health Care Education/Training Program

## 2022-05-17 ENCOUNTER — Ambulatory Visit (INDEPENDENT_AMBULATORY_CARE_PROVIDER_SITE_OTHER): Payer: No Payment, Other

## 2022-05-17 ENCOUNTER — Encounter (HOSPITAL_COMMUNITY): Payer: Self-pay

## 2022-05-17 VITALS — BP 127/77 | HR 62 | Ht 72.0 in | Wt 236.0 lb

## 2022-05-17 DIAGNOSIS — F333 Major depressive disorder, recurrent, severe with psychotic symptoms: Secondary | ICD-10-CM

## 2022-05-17 DIAGNOSIS — F209 Schizophrenia, unspecified: Secondary | ICD-10-CM

## 2022-05-17 DIAGNOSIS — F1910 Other psychoactive substance abuse, uncomplicated: Secondary | ICD-10-CM | POA: Diagnosis not present

## 2022-05-17 DIAGNOSIS — F411 Generalized anxiety disorder: Secondary | ICD-10-CM

## 2022-05-17 DIAGNOSIS — F200 Paranoid schizophrenia: Secondary | ICD-10-CM

## 2022-05-17 DIAGNOSIS — F1210 Cannabis abuse, uncomplicated: Secondary | ICD-10-CM

## 2022-05-17 DIAGNOSIS — G4700 Insomnia, unspecified: Secondary | ICD-10-CM | POA: Diagnosis not present

## 2022-05-17 DIAGNOSIS — F4310 Post-traumatic stress disorder, unspecified: Secondary | ICD-10-CM

## 2022-05-17 DIAGNOSIS — F32A Depression, unspecified: Secondary | ICD-10-CM

## 2022-05-17 NOTE — Progress Notes (Signed)
BH MD/PA/NP OP Progress Note  05/17/2022 2:15 PM Marc Hart  MRN:  [id number]  Chief Complaint:  Chief Complaint  Patient presents with   Administration of LAI   Follow-up   HPI:  Marc Hart 69 yr. male with psychiatric history of paranoid schizophrenia, major depression, PTSD, anxiety, and insomnia.  He is seen in office today for follow up psychiatric evaluation and administration of long acting injectable.  His mental health is currently managed with Invega Trinza 561 mg every 3 months.    He reports that he continues to do well on his [place].  He reports that he is not having any noticeable side effects.  He reports he is sleeping good.  He reports his appetite is doing good.  He reports no SI, HI, or AVH.  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  He reports he is doing well overall and has recently been trying to lose weight.  He reports no issues with food in the house just trying to take a healthier approach to things.  He reports no symptoms of depression.  He reports no symptoms of anxiety.  He reports no other concerns at present.  He will return for follow-up in 3 months for an additional injection.   Visit Diagnosis:    ICD-10-CM   1. Paranoid schizophrenia (HCC)  F20.0     2. PTSD (post-traumatic stress disorder)  F43.10     3. Polysubstance abuse (HCC)  F19.10     4. Insomnia, unspecified type  G47.00       Past Psychiatric History: Paranoid schizophrenia, schizoaffective disorder, Anxiety, Depression, Polysubstance abuse, cannabis use disorder mild.   Past Medical History:  Past Medical History:  Diagnosis Date   Schizophrenia (HCC)    Substance abuse (HCC)     Past Surgical History:  Procedure Laterality Date   OTHER SURGICAL HISTORY     Ear Surgery     Family Psychiatric History: None reported  Family History: No family history on file.  Social History:  Social  History   Socioeconomic History   Marital status: Single    Spouse name: Not on file   Number of children: Not on file   Years of education: Not on file   Highest education level: Not on file  Occupational History   Not on file  Tobacco Use   Smoking status: Some Days   Smokeless tobacco: Never  Vaping Use   Vaping Use: Every day  Substance and Sexual Activity   Alcohol use: Not Currently    Comment: 6-9 tallboy beers/day   Drug use: Not Currently    Types: Marijuana, IV, Cocaine, Methamphetamines    Comment: heroin 1gram/day   Sexual activity: Not on file  Other Topics Concern   Not on file  Social History Narrative   ** Merged History Encounter **       Social Determinants of Health   Financial Resource Strain: Not on file  Food Insecurity: Not on file  Transportation Needs: Not on file  Physical Activity: Not on file  Stress: Not on file  Social Connections: Not on file    Allergies:  Allergies  Allergen Reactions   Amoxicillin Anaphylaxis    Has patient had a PCN reaction causing immediate rash, facial/tongue/throat swelling, SOB or lightheadedness with hypotension: Yes Has patient had a PCN reaction causing severe rash involving mucus membranes or skin necrosis: No Has  patient had a PCN reaction that required hospitalization No Has patient had a PCN reaction occurring within the last 10 years: Yes If all of the above answers are "NO", then may proceed with Cephalosporin use.   Tylenol [Acetaminophen] Itching    Knees itched   Other Rash    Allergic reaction to coppertone suntan lotion    Metabolic Disorder Labs: Lab Results  Component Value Date   HGBA1C 5.1 12/08/2020   MPG 99.67 12/08/2020   No results found for: "PROLACTIN" Lab Results  Component Value Date   CHOL 112 12/08/2020   TRIG 97 12/08/2020   HDL 27 (L) 12/08/2020   CHOLHDL 4.1 12/08/2020   VLDL 19 12/08/2020   LDLCALC 66 12/08/2020   Lab Results  Component Value Date   TSH 1.449  12/08/2020    Therapeutic Level Labs: No results found for: "LITHIUM" No results found for: "VALPROATE" No results found for: "CBMZ"  Current Medications: Current Outpatient Medications  Medication Sig Dispense Refill   Paliperidone Palmitate ER (INVEGA TRINZA) 546 MG/1.75ML injection Inject 1.75 mLs (546 mg total) into the muscle every 3 (three) months. 1.8 mL 1   Current Facility-Administered Medications  Medication Dose Route Frequency Provider Last Rate Last Admin   Paliperidone Palmitate ER SUSY 561.6 mg  561.6 mg Intramuscular Q28 days Toy Cookey E, NP   561.6 mg at 05/17/22 1359     Musculoskeletal: Strength & Muscle Tone: within normal limits Gait & Station: normal Patient leans: N/A  Psychiatric Specialty Exam: Review of Systems  Respiratory:  Negative for cough and shortness of breath.   Cardiovascular:  Negative for chest pain.  Gastrointestinal:  Negative for abdominal pain, constipation, diarrhea, nausea and vomiting.  Neurological:  Negative for weakness and headaches.  Psychiatric/Behavioral:  Negative for agitation, dysphoric mood, hallucinations, self-injury, sleep disturbance and suicidal ideas. The patient is not nervous/anxious.     Blood pressure 127/77, pulse 62, height 6' (1.829 m), weight 236 lb (107 kg), SpO2 98 %.Body mass index is 32.01 kg/m.  General Appearance: Casual, Fairly Groomed, and Neat  Eye Contact:  Good  Speech:  Clear and Coherent and Normal Rate  Volume:  Normal  Mood:   "good"  Affect:  Appropriate and Congruent  Thought Process:  Coherent and Goal Directed  Orientation:  Full (Time, Place, and Person)  Thought Content: Logical   Suicidal Thoughts:  No  Homicidal Thoughts:  No  Memory:  Immediate;   Good Recent;   Good  Judgement:  Fair  Insight:  Fair  Psychomotor Activity:  Normal  Concentration:  Concentration: Good and Attention Span: Good  Recall:  Good  Fund of Knowledge: Good  Language: Good  Akathisia:   Negative  Handed:  Right  AIMS (if indicated): done AIMS=0  Assets:  Communication Skills Desire for Improvement Financial Resources/Insurance Housing Physical Health Resilience Social Support Transportation  ADL's:  Intact  Cognition: WNL  Sleep:  Good   Screenings: AIMS    Flowsheet Row Office Visit from 02/22/2022 in [facility] Office Visit from 11/23/2021 in [facility] Admission (Discharged) [facility] (Discharged) [facility]  AIMS Total Score 0 0 0 0      AUDIT    Flowsheet Row Ad[facility] ([facility]  Alcohol Use Disorder Identification Test Final Score (AUDIT) 3      GAD-7  Flowsheet Row Office Visit from 02/22/2022 in [facility] Office Visit from 11/23/2021 in [facility] Office Visit from 08/24/2021 in [facility] Clinical Support from 04/13/2021 in [facility] Video Visit from 11/28/2020 in [facility]  Total GAD-7 Score 0 0 6 5 17       PHQ2-9    Flowsheet Row Office Visit from 05/17/2022 in [facility] Office Visit from 02/22/2022 in [facility] Office Visit from 11/23/2021 in [facility] Office Visit from 09/25/2021 in [facility] Office Visit from 08/24/2021 in [facility]  PHQ-2 Total Score 0 0 0 0 2  PHQ-9 Total Score -- -- -- -- 3      Flowsheet Row Office Visit from 02/22/2022 in [facility] Office Visit from 11/23/2021 in [facility] Office Visit from 08/24/2021 in [facility]  C-SSRS RISK CATEGORY No Risk No Risk No Risk        Assessment and Plan:  Marc Hart 77 yr. male with psychiatric history of paranoid schizophrenia, major depression, PTSD, anxiety, and insomnia.  He is seen in office today for follow up psychiatric evaluation and administration of long acting injectable.  His mental health is currently managed with Invega Trinza 561 mg every 3 months.   Zyrus continues to do well on his [place].  He is not having any side effects and his symptoms are being well-managed.  We will continue with the Eyvonne Mechanic every 3 months.  He will return in approximately 3 months for follow-up.   1. Paranoid schizophrenia (HCC)  -Continue Invega Trinza 561 mg q3 months.  2. Polysubstance abuse (HCC)   3. Generalized anxiety disorder   4. PTSD (post-traumatic stress disorder)   5. Insomnia, unspecified type   Collaboration of Care:   Patient/Guardian was advised Release of Information must be obtained prior to any record release in order to collaborate their care with an outside provider. Patient/Guardian was advised if they have not already done so to contact the registration department to sign all necessary forms in order for Eyvonne Mechanic to release information regarding their care.   Consent: Patient/Guardian gives verbal consent for treatment and assignment of benefits for services provided during this visit. Patient/Guardian expressed understanding and agreed to proceed.    [place], MD 05/17/2022, 2:15 PM

## 2022-05-17 NOTE — Progress Notes (Signed)
Patient arrived for injection Paliperidone Palmitate ER SUSY 561.6 mg 1.8 mL  546 mg/1.75 mL (rounded to the nearest 0.1 mL) = 561.6 mg   Patient doing well & tolerated injection well in Left-Arm & Pleasant as Always

## 2022-07-25 ENCOUNTER — Ambulatory Visit (HOSPITAL_COMMUNITY): Payer: No Payment, Other

## 2022-07-26 ENCOUNTER — Ambulatory Visit (HOSPITAL_COMMUNITY): Payer: No Payment, Other

## 2022-08-15 ENCOUNTER — Ambulatory Visit: Payer: Medicaid Other

## 2022-08-16 ENCOUNTER — Ambulatory Visit (HOSPITAL_COMMUNITY): Payer: No Payment, Other

## 2022-08-22 ENCOUNTER — Encounter (HOSPITAL_COMMUNITY): Payer: Self-pay

## 2022-08-22 ENCOUNTER — Ambulatory Visit (HOSPITAL_COMMUNITY): Payer: Medicaid Other

## 2022-08-22 ENCOUNTER — Encounter: Payer: Self-pay | Admitting: Nurse Practitioner

## 2022-08-22 VITALS — BP 130/84 | HR 85 | Resp 13 | Ht 72.0 in | Wt 226.0 lb

## 2022-08-22 DIAGNOSIS — F4310 Post-traumatic stress disorder, unspecified: Secondary | ICD-10-CM

## 2022-08-22 DIAGNOSIS — F333 Major depressive disorder, recurrent, severe with psychotic symptoms: Secondary | ICD-10-CM

## 2022-08-22 DIAGNOSIS — F1910 Other psychoactive substance abuse, uncomplicated: Secondary | ICD-10-CM

## 2022-08-22 DIAGNOSIS — F200 Paranoid schizophrenia: Secondary | ICD-10-CM

## 2022-08-22 NOTE — Progress Notes (Signed)
Patient received injection of Invega Trinza 546mg  as ordered q3 months. Given in Right Deltoid without issue or complaint. Pleasant, smiling and interacting appropriately with staff. Denies any side effects. Does state he has been more anxious lately since he has not been able to get some of the medications he use to be on.

## 2022-08-24 LAB — CBC WITH DIFFERENTIAL/PLATELET
Basophils Absolute: 0.1 10*3/uL (ref 0.0–0.2)
Basos: 1 %
EOS (ABSOLUTE): 0.1 10*3/uL (ref 0.0–0.4)
Eos: 1 %
Hematocrit: 50.3 % (ref 37.5–51.0)
Hemoglobin: 17 g/dL (ref 13.0–17.7)
Immature Grans (Abs): 0 10*3/uL (ref 0.0–0.1)
Immature Granulocytes: 0 %
Lymphocytes Absolute: 2.2 10*3/uL (ref 0.7–3.1)
Lymphs: 23 %
MCH: 30.4 pg (ref 26.6–33.0)
MCHC: 33.8 g/dL (ref 31.5–35.7)
MCV: 90 fL (ref 79–97)
Monocytes Absolute: 0.7 10*3/uL (ref 0.1–0.9)
Monocytes: 7 %
Neutrophils Absolute: 6.5 10*3/uL (ref 1.4–7.0)
Neutrophils: 68 %
Platelets: 217 10*3/uL (ref 150–450)
RBC: 5.59 x10E6/uL (ref 4.14–5.80)
RDW: 13.4 % (ref 11.6–15.4)
WBC: 9.5 10*3/uL (ref 3.4–10.8)

## 2022-08-24 LAB — LIPID PANEL
Chol/HDL Ratio: 3.4 ratio (ref 0.0–5.0)
Cholesterol, Total: 134 mg/dL (ref 100–199)
HDL: 39 mg/dL — ABNORMAL LOW (ref >39)
LDL Chol Calc (NIH): 76 mg/dL (ref 0–99)
Triglycerides: 103 mg/dL (ref 0–149)
VLDL Cholesterol Cal: 19 mg/dL (ref 5–40)

## 2022-08-24 LAB — HEMOGLOBIN A1C
Est. average glucose Bld gHb Est-mCnc: 105 mg/dL
Hgb A1c MFr Bld: 5.3 % (ref 4.8–5.6)

## 2022-08-24 LAB — SPECIMEN STATUS REPORT

## 2022-09-19 ENCOUNTER — Ambulatory Visit: Payer: Self-pay | Admitting: Nurse Practitioner

## 2022-09-20 ENCOUNTER — Telehealth (HOSPITAL_COMMUNITY): Payer: Self-pay

## 2022-11-21 ENCOUNTER — Ambulatory Visit (INDEPENDENT_AMBULATORY_CARE_PROVIDER_SITE_OTHER): Payer: No Payment, Other | Admitting: Student in an Organized Health Care Education/Training Program

## 2022-11-21 ENCOUNTER — Ambulatory Visit (INDEPENDENT_AMBULATORY_CARE_PROVIDER_SITE_OTHER): Payer: No Payment, Other

## 2022-11-21 ENCOUNTER — Encounter (HOSPITAL_COMMUNITY): Payer: Self-pay

## 2022-11-21 VITALS — BP 121/71 | HR 65 | Ht 71.0 in | Wt 223.4 lb

## 2022-11-21 VITALS — BP 121/71 | HR 61 | Resp 16 | Ht 72.0 in | Wt 223.4 lb

## 2022-11-21 DIAGNOSIS — F200 Paranoid schizophrenia: Secondary | ICD-10-CM

## 2022-11-21 DIAGNOSIS — G4700 Insomnia, unspecified: Secondary | ICD-10-CM | POA: Diagnosis not present

## 2022-11-21 DIAGNOSIS — F4310 Post-traumatic stress disorder, unspecified: Secondary | ICD-10-CM | POA: Diagnosis not present

## 2022-11-21 NOTE — Progress Notes (Signed)
BH MD/PA/NP OP Progress Note  11/21/2022 3:44 PM Marc Hart  MRN:  [id number]  Chief Complaint:  Chief Complaint  Patient presents with   [facility]   HPI:  Marc Hart 73 yr. male with psychiatric history of paranoid schizophrenia, major depression, PTSD, anxiety, and insomnia.  He is seen in office today for follow up psychiatric evaluation and administration of long acting injectable.  His mental health is currently managed with Invega Trinza 561 mg every 3 months.   He reports that he continues to do well on his [place].  He reports no side effects of medications.  He reports that his symptoms are well controlled with that.  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  He reports sleep is good.  He reports occasionally having some issues with appetite where for a day every couple weeks he will forget to eat but then his appetite resumes is normal.  He reports no SI, HI, or AVH.  He reports no other concerns present.  Discussed with him that we will not make any medication changes at this time he was agreeable to this.  He will return in 3 months for his next [place] shot.   Visit Diagnosis:    ICD-10-CM   1. Paranoid schizophrenia ([place])  F20.0     2. PTSD (post-traumatic stress disorder)  F43.10     3. Insomnia, unspecified type  G47.00       Past Psychiatric History: Paranoid schizophrenia, schizoaffective disorder, Anxiety, Depression, Polysubstance abuse, cannabis use disorder mild.    Past Medical History:  Past Medical History:  Diagnosis Date   Schizophrenia ([place])    Substance abuse ([place])     Past Surgical History:  Procedure Laterality Date   OTHER SURGICAL HISTORY     Ear Surgery     Family Psychiatric History: None reported   Family History: No family history on file.  Social History:  Social History   Socioeconomic History   Marital status: Single    Spouse name:  Not on file   Number of children: Not on file   Years of education: Not on file   Highest education level: Not on file  Occupational History   Not on file  Tobacco Use   Smoking status: Some Days   Smokeless tobacco: Never  Vaping Use   Vaping Use: Every day  Substance and Sexual Activity   Alcohol use: Not Currently    Comment: 6-9 tallboy beers/day   Drug use: Not Currently    Types: Marijuana, IV, Cocaine, Methamphetamines    Comment: heroin 1gram/day   Sexual activity: Not on file  Other Topics Concern   Not on file  Social History Narrative   ** Merged History Encounter **       Social Determinants of Health   Financial Resource Strain: Not on file  Food Insecurity: Not on file  Transportation Needs: Not on file  Physical Activity: Not on file  Stress: Not on file  Social Connections: Not on file    Allergies:  Allergies  Allergen Reactions   Amoxicillin Anaphylaxis    Has patient had a PCN reaction causing immediate rash, facial/tongue/throat swelling, SOB or lightheadedness with hypotension: Yes Has patient had a PCN reaction causing severe rash involving mucus membranes or skin necrosis: No Has patient had a PCN reaction that required hospitalization No Has patient had a PCN reaction occurring  within the last 10 years: Yes If all of the above answers are "NO", then may proceed with Cephalosporin use.   Tylenol [Acetaminophen] Itching    Knees itched   Other Rash    Allergic reaction to coppertone suntan lotion    Metabolic Disorder Labs: Lab Results  Component Value Date   HGBA1C 5.3 08/23/2022   MPG 99.67 12/08/2020   No results found for: "PROLACTIN" Lab Results  Component Value Date   CHOL 134 08/23/2022   TRIG 103 08/23/2022   HDL 39 (L) 08/23/2022   CHOLHDL 3.4 08/23/2022   VLDL 19 12/08/2020   LDLCALC 76 08/23/2022   LDLCALC 66 12/08/2020   Lab Results  Component Value Date   TSH 1.449 12/08/2020    Therapeutic Level Labs: No  results found for: "LITHIUM" No results found for: "VALPROATE" No results found for: "CBMZ"  Current Medications: Current Outpatient Medications  Medication Sig Dispense Refill   Paliperidone Palmitate ER (INVEGA TRINZA) 546 MG/1.75ML injection Inject 1.75 mLs (546 mg total) into the muscle every 3 (three) months. 1.8 mL 1   Current Facility-Administered Medications  Medication Dose Route Frequency Provider Last Rate Last Admin   Paliperidone Palmitate ER SUSY 561.6 mg  561.6 mg Intramuscular Q28 days Eulis Canner E, NP   561.6 mg at 11/21/22 1522     Musculoskeletal: Strength & Muscle Tone: within normal limits Gait & Station: normal Patient leans: N/A  Psychiatric Specialty Exam: Review of Systems  Respiratory:  Negative for cough and shortness of breath.   Cardiovascular:  Negative for chest pain.  Gastrointestinal:  Negative for abdominal pain, constipation, diarrhea, nausea and vomiting.  Neurological:  Negative for weakness and headaches.  Psychiatric/Behavioral:  Negative for dysphoric mood, hallucinations, sleep disturbance and suicidal ideas. The patient is not nervous/anxious.     Blood pressure 121/71, pulse 65, height 5\' 11"  (1.803 m), weight 223 lb 6.4 oz (101.3 kg), SpO2 99 %.Body mass index is 31.16 kg/m.  General Appearance: Casual and Fairly Groomed  Eye Contact:  Good  Speech:  Clear and Coherent and Normal Rate  Volume:  Normal  Mood:  Euthymic  Affect:  Appropriate and Congruent  Thought Process:  Coherent and Goal Directed  Orientation:  Full (Time, Place, and Person)  Thought Content: WDL and Logical   Suicidal Thoughts:  No  Homicidal Thoughts:  No  Memory:  Immediate;   Good Recent;   Good  Judgement:  Good  Insight:  Good  Psychomotor Activity:  Normal  Concentration:  Concentration: Good and Attention Span: Good  Recall:  Good  Fund of Knowledge: Good  Language: Good  Akathisia:  Negative  Handed:  Right  AIMS (if indicated): not done   Assets:  Communication Skills Desire for Improvement Financial Resources/Insurance [place] Support Transportation  ADL's:  Intact  Cognition: WNL  Sleep:  Good   Screenings: [place] Office Visit from [facility]20[facility] Office Visit from 11/23/2021 in [facility] Admission (Discharged) from 12/07/2020 in [place] [facility] Admission (Discharged) from 06/26/2018 in [place] [facility]  AIMS Total Score 0 0 0 0      AUDIT    Flowsheet Row Admission (Discharged) from 12/07/2020 in [place] [facility]  Alcohol Use Disorder Identification Test Final Score (AUDIT) 3      GAD-7    Flowsheet Row Office Visit from 11/21/2022 in  [facility] Office Visit from [facility]/22/2022 [facility] Office Visit from [facility]/23/2021 in [facility] Office Visit from 08/24/2021 in [place] from 04/13/2021 in [facility]  Total GAD-7 Score 3 0 0 6 5      PHQ2-9    [place] Office Visit from 11/21/2022 in [facility] Office Visit from 05/17/2022 in [facility] Office Visit from 02/22/2022 in [facility] Office Visit from 11/23/2021 in [facility] Office Visit from 09/25/2021 in [facility] for Infectious Disease  PHQ-2 Total Score 0 0 0 0 0  PHQ-9 Total Score 0 -- -- -- --      [place] Office Visit from 02/22/2022 in [facility] Office Visit from 11/23/2021 in [facility] Office Visit from 08/24/2021 in [place] No Risk No  Risk No Risk        Assessment and Plan:  Marc Hart 69 yr. male with psychiatric history of paranoid schizophrenia, major depression, PTSD, anxiety, and insomnia.  He is seen in office today for follow up psychiatric evaluation and administration of long acting injectable.  His mental health is currently managed with Invega Trinza 561 mg every 3 months.    Marc Hart has continued to do well on his [name] without any issues.  His symptoms are being well managed.  We will not make any changes to his medications at this time.  We will continue with the [name] every 3 months. He will return in approximately 3 months for follow-up.     1. Paranoid schizophrenia ([place])  -Continue Invega Trinza 561 mg q3 months.   2. Polysubstance abuse ([place])   3. Generalized anxiety disorder   4. PTSD (post-traumatic stress disorder)   5. Insomnia, unspecified type    Collaboration of Care:   Patient/Guardian was advised Release of Information must be obtained prior to any record release in order to collaborate their care with an outside provider. Patient/Guardian was advised if they have not already done so to contact the registration department to sign all necessary forms in order for [place] to release information regarding their care.   Consent: Patient/Guardian gives verbal consent for treatment and assignment of benefits for services provided during this visit. Patient/Guardian expressed understanding and agreed to proceed.    Briant Cedar, MD 11/21/2022, 3:44 PM

## 2022-11-21 NOTE — Progress Notes (Signed)
Patient arrived for injection Paliperidone Palmitate ER SUSY 561.6 mg 1.8 mL  546 mg/1.75 mL (rounded to the nearest 0.1 mL) = 561.6 mg   Patient doing well & tolerated injection well in [place] as Always

## 2022-12-17 ENCOUNTER — Ambulatory Visit: Payer: Self-pay | Admitting: Family Medicine

## 2023-01-14 ENCOUNTER — Other Ambulatory Visit: Payer: Self-pay

## 2023-01-14 ENCOUNTER — Encounter (HOSPITAL_BASED_OUTPATIENT_CLINIC_OR_DEPARTMENT_OTHER): Payer: Self-pay | Admitting: Urology

## 2023-01-14 DIAGNOSIS — R21 Rash and other nonspecific skin eruption: Secondary | ICD-10-CM | POA: Insufficient documentation

## 2023-01-14 DIAGNOSIS — Z5321 Procedure and treatment not carried out due to patient leaving prior to being seen by health care provider: Secondary | ICD-10-CM | POA: Insufficient documentation

## 2023-01-14 NOTE — ED Triage Notes (Signed)
Pt states rash to anterior elbow, behind knees,and on abdomen that started on Friday  Pt works in the [employer]

## 2023-01-15 ENCOUNTER — Emergency Department (HOSPITAL_BASED_OUTPATIENT_CLINIC_OR_DEPARTMENT_OTHER)
Admission: EM | Admit: 2023-01-15 | Discharge: 2023-01-15 | Discharge status: Against Medical Advice | Payer: Self-pay | Attending: Emergency Medicine | Admitting: Emergency Medicine

## 2023-02-27 ENCOUNTER — Encounter (HOSPITAL_COMMUNITY): Payer: Self-pay

## 2023-02-27 ENCOUNTER — Ambulatory Visit (INDEPENDENT_AMBULATORY_CARE_PROVIDER_SITE_OTHER): Payer: No Payment, Other | Admitting: Student in an Organized Health Care Education/Training Program

## 2023-02-27 VITALS — BP 149/137 | HR 62 | Resp 16 | Ht 71.0 in | Wt 221.2 lb

## 2023-02-27 DIAGNOSIS — G4700 Insomnia, unspecified: Secondary | ICD-10-CM

## 2023-02-27 DIAGNOSIS — F411 Generalized anxiety disorder: Secondary | ICD-10-CM

## 2023-02-27 DIAGNOSIS — Z79899 Other long term (current) drug therapy: Secondary | ICD-10-CM | POA: Insufficient documentation

## 2023-02-27 DIAGNOSIS — F1910 Other psychoactive substance abuse, uncomplicated: Secondary | ICD-10-CM | POA: Diagnosis not present

## 2023-02-27 DIAGNOSIS — F200 Paranoid schizophrenia: Secondary | ICD-10-CM | POA: Diagnosis not present

## 2023-02-27 MED ORDER — INVEGA TRINZA 546 MG/1.75ML IM SUSY: 546.0000 mg | PREFILLED_SYRINGE | INTRAMUSCULAR | 1 refills | Status: DC

## 2023-02-27 MED ORDER — PALIPERIDONE PALMITATE ER 546 MG/1.75ML IM SUSY
546.0000 mg | PREFILLED_SYRINGE | Freq: Once | INTRAMUSCULAR | Status: AC
  Administered 2023-02-27: 546 mg via INTRAMUSCULAR

## 2023-02-27 NOTE — Progress Notes (Addendum)
BH MD/PA/NP OP Progress Note  02/27/2023 3:16 PM Marc Hart  MRN:  [id number]  Chief Complaint:  Chief Complaint  Patient presents with   LAI   Injections   HPI:  Marc Hart 39 yr. male with psychiatric history of paranoid schizophrenia, major depression, PTSD, anxiety, and insomnia.  He is seen in office today for follow up psychiatric evaluation and administration of long acting injectable.  His mental health is currently managed with Invega Trinza 561 mg every 3 months.   He reports he has continued to do well since last seen in the [facility].  He reports he continues to control his symptoms well.  He does report a few weeks ago he had poison ivy and was started on hydroxyzine.  He reports that he had a bad reaction to the hydroxyzine and it caused him to have rapidly changing moods going from angry to sad.  He reports that since stopping the hydroxyzine these symptoms have resolved and he is back to baseline.  He does report that at the end of his 3 months he does start to have some paranoia return but it is mainly in the form of him wanting to avoid certain places and he reports that he stays home instead.  Discussed reality testing with him and encouraged him to do this whenever these thoughts surface.  He reports that the [place] tringle is overall doing well and he does not think any changes need to be made at this time.  Discussed we would proceed with an injection today and continue with his current dose and he was agreeable to this.  He reports no SI, HI, or AVH.  He reports sleep is fair.  Reports appetite is doing good.  He reports no other concerns at present.  He will return for follow-up approximately 3 months.   Visit Diagnosis:    ICD-10-CM   1. Paranoid schizophrenia (HCC)  F20.0 Paliperidone Palmitate ER (INVEGA TRINZA) 546 MG/1.75ML injection      Past Psychiatric History: Paranoid schizophrenia, schizoaffective disorder, Anxiety, Depression, Polysubstance  abuse, cannabis use disorder mild.    Past Medical History:  Past Medical History:  Diagnosis Date   Schizophrenia (HCC)    Substance abuse (HCC)     Past Surgical History:  Procedure Laterality Date   OTHER SURGICAL HISTORY     Ear Surgery     Family Psychiatric History: None reported   Family History: History reviewed. No pertinent family history.  Social History:  Social History   Socioeconomic History   Marital status: Single    Spouse name: Not on file   Number of children: Not on file   Years of education: Not on file   Highest education level: Not on file  Occupational History   Not on file  Tobacco Use   Smoking status: Every Day    Packs/day: .5    Types: Cigarettes   Smokeless tobacco: Never  Vaping Use   Vaping Use: Every day  Substance and Sexual Activity   Alcohol use: Yes    Comment: 1 every 2 weeks   Drug use: Yes    Types: Marijuana    Comment: daily   Sexual activity: Not on file  Other Topics Concern   Not on file  Social History Narrative   ** Merged History Encounter **       Social Determinants of Health   Financial Resource Strain: Not on file  Food Insecurity: Not on file  Transportation Needs:  Not on file  Physical Activity: Not on file  Stress: Not on file  Social Connections: Not on file    Allergies:  Allergies  Allergen Reactions   Amoxicillin Anaphylaxis    Has patient had a PCN reaction causing immediate rash, facial/tongue/throat swelling, SOB or lightheadedness with hypotension: Yes Has patient had a PCN reaction causing severe rash involving mucus membranes or skin necrosis: No Has patient had a PCN reaction that required hospitalization No Has patient had a PCN reaction occurring within the last 10 years: Yes If all of the above answers are "NO", then may proceed with Cephalosporin use.   Tylenol [Acetaminophen] Itching    Knees itched   Other Rash    Allergic reaction to coppertone suntan lotion    Metabolic  Disorder Labs: Lab Results  Component Value Date   HGBA1C 5.3 08/23/2022   MPG 99.67 12/08/2020   No results found for: "PROLACTIN" Lab Results  Component Value Date   CHOL 134 08/23/2022   TRIG 103 08/23/2022   HDL 39 (L) 08/23/2022   CHOLHDL 3.4 08/23/2022   VLDL 19 12/08/2020   LDLCALC 76 08/23/2022   LDLCALC 66 12/08/2020   Lab Results  Component Value Date   TSH 1.449 12/08/2020    Therapeutic Level Labs: No results found for: "LITHIUM" No results found for: "VALPROATE" No results found for: "CBMZ"  Current Medications: Current Outpatient Medications  Medication Sig Dispense Refill   Paliperidone Palmitate ER (INVEGA TRINZA) 546 MG/1.75ML injection Inject 1.75 mLs (546 mg total) into the muscle every 3 (three) months. 1.8 mL 1   Current Facility-Administered Medications  Medication Dose Route Frequency Provider Last Rate Last Admin   Paliperidone Palmitate ER SUSY 561.6 mg  561.6 mg Intramuscular Q28 days Toy Cookey E, NP   561.6 mg at 11/21/22 1522     Musculoskeletal: Strength & Muscle Tone: within normal limits Gait & Station: normal Patient leans: N/A  Psychiatric Specialty Exam: Review of Systems  Respiratory:  Negative for cough and shortness of breath.   Cardiovascular:  Negative for chest pain.  Gastrointestinal:  Negative for abdominal pain, constipation, diarrhea, nausea and vomiting.  Neurological:  Negative for weakness and headaches.  Psychiatric/Behavioral:  Negative for dysphoric mood, hallucinations, sleep disturbance and suicidal ideas. The patient is not nervous/anxious.     Blood pressure (!) 149/137, pulse 62, resp. rate 16, height 5\' 11"  (1.803 m), weight 221 lb 3.2 oz (100.3 kg), SpO2 98 %.Body mass index is 30.85 kg/m.  General Appearance: Casual and Fairly Groomed  Eye Contact:  Good  Speech:  Clear and Coherent and Normal Rate  Volume:  Normal  Mood:  Euthymic  Affect:  Appropriate and Congruent  Thought Process:   Coherent and Goal Directed  Orientation:  Full (Time, Place, and Person)  Thought Content: WDL and Logical   Suicidal Thoughts:  No  Homicidal Thoughts:  No  Memory:  Immediate;   Good Recent;   Good  Judgement:  Good  Insight:  Good  Psychomotor Activity:  Normal  Concentration:  Concentration: Good and Attention Span: Good  Recall:  Good  Fund of Knowledge: Good  Language: Good  Akathisia:  Negative  Handed:  Right  AIMS (if indicated): done AIMS= 0  Assets:  Communication Skills Desire for Improvement Financial Resources/Insurance Housing Physical Health Resilience Social Support Transportation  ADL's:  Intact  Cognition: WNL  Sleep:  Good   Screenings: AIMS    Flowsheet Row Clinical Support from 02/27/2023 [facility] Visit from 02/22/2022 in [facility] Office Visit from 11/23/2021 in [facility]Dis[facility]  AIMS Total Score 0 0 0 0 0      AUDIT    Flowsheet Row [facility]  Alcohol Use Disorder Identification Test Final Score (AUDIT) 3      GAD-7    Flowsheet Row Clinical Support from 02/27/2023 in [facility] Office Visit from 11/21/2022 in [facility] Office Visit from 02/22/2022 in [facility] Office Visit from 11/23/2021 in [facility] Office Visit from 08/24/2021 in [facility]  Total GAD-7 Score 0 3 0 0 6      PHQ2-9    Flowsheet Row Clinical Support from 02/27/2023 in [facility] Office Visit from 11/21/2022 in [facility]  Visit from 05/17/2022 in [facility] Office Visit from 02/22/2022 in [facility] Office Visit from [facility]/23/2021 in [facility]  PHQ-2 Total Score 0 0 0 0 0  PHQ-9 Total Score 0 0 -- -- --      Flowsheet Row ED from 01/15/2023 in [facility] Office Visit from 02/22/2022 in [facility] Office Visit from [facility]/2021 [facility]  C-SSRS RISK CATEGORY No Risk No Risk No Risk        Assessment and Plan:  Marc Hart 73 yr. male with psychiatric history of paranoid schizophrenia, major depression, PTSD, anxiety, and insomnia.  He is seen in office today for follow up psychiatric evaluation and administration of long acting injectable.  His mental health is currently managed with Invega Trinza 546 mg every 3 months.    Yvon reports he has been doing well on his [place].  He reports that after having poison ivy and being started on hydroxyzine he did have a bad reaction to it that caused him to have rapidly changing moods but since the hydroxyzine has stopped this has resolved.  We will not make any changes to his medications at this time.  He received his Eyvonne Mechanic today.  He will return for follow-up in approximately 3 months.    1. Paranoid schizophrenia (HCC)  -Continue Invega Trinza 546 mg q3 months. 2 Refills sent in.   2. Polysubstance abuse (HCC)   3. Generalized anxiety disorder   4. PTSD (post-traumatic stress disorder)   5. Insomnia, unspecified type    Collaboration of Care:   Patient/Guardian was advised Release of Information must be obtained prior to any record release in order to collaborate their care with an outside provider. Patient/Guardian was advised if they have not already done so to contact the registration department to sign all necessary forms in order for [place] to  release information regarding their care.   Consent: Patient/Guardian gives verbal consent for treatment and assignment of benefits for services provided during this visit. Patient/Guardian expressed understanding and agreed to proceed.    Lauro Franklin, MD 02/27/2023, 3:16 PM

## 2023-02-27 NOTE — Progress Notes (Signed)
Patient arrived for injection of Invega Trinza 546mg . Given in Right Upper Outer Quadrant. No issues or complaints. Did speak with MD before injection was given. Was slightly paranoid about getting injection talking about two forces working against each other and not knowing who to trust or what is actually going in his body. Pleasant and cooperative. No SI/HI.

## 2023-05-30 ENCOUNTER — Ambulatory Visit (HOSPITAL_COMMUNITY): Payer: No Payment, Other | Admitting: Student

## 2023-05-30 ENCOUNTER — Encounter (HOSPITAL_COMMUNITY): Payer: Self-pay

## 2023-05-30 ENCOUNTER — Emergency Department (HOSPITAL_COMMUNITY)
Admission: EM | Admit: 2023-05-30 | Discharge: 2023-05-31 | Disposition: A | Discharge status: Other Acute Inpatient Hospital | Payer: MEDICAID | Attending: Family Medicine | Admitting: Family Medicine

## 2023-05-30 ENCOUNTER — Ambulatory Visit (INDEPENDENT_AMBULATORY_CARE_PROVIDER_SITE_OTHER): Payer: No Payment, Other | Admitting: *Deleted

## 2023-05-30 ENCOUNTER — Encounter (HOSPITAL_COMMUNITY): Payer: Self-pay | Admitting: Student

## 2023-05-30 ENCOUNTER — Other Ambulatory Visit: Payer: Self-pay

## 2023-05-30 ENCOUNTER — Ambulatory Visit (HOSPITAL_COMMUNITY)
Admission: EM | Admit: 2023-05-30 | Discharge: 2023-05-30 | Disposition: A | Discharge status: Other Acute Inpatient Hospital | Payer: MEDICAID | Attending: Psychiatry | Admitting: Psychiatry

## 2023-05-30 VITALS — BP 128/64 | HR 79 | Resp 18 | Ht 71.0 in | Wt 221.0 lb

## 2023-05-30 DIAGNOSIS — F17210 Nicotine dependence, cigarettes, uncomplicated: Secondary | ICD-10-CM | POA: Insufficient documentation

## 2023-05-30 DIAGNOSIS — R451 Restlessness and agitation: Secondary | ICD-10-CM | POA: Diagnosis not present

## 2023-05-30 DIAGNOSIS — F209 Schizophrenia, unspecified: Secondary | ICD-10-CM | POA: Insufficient documentation

## 2023-05-30 DIAGNOSIS — F200 Paranoid schizophrenia: Secondary | ICD-10-CM | POA: Diagnosis not present

## 2023-05-30 DIAGNOSIS — R4182 Altered mental status, unspecified: Secondary | ICD-10-CM | POA: Insufficient documentation

## 2023-05-30 DIAGNOSIS — F259 Schizoaffective disorder, unspecified: Secondary | ICD-10-CM

## 2023-05-30 DIAGNOSIS — Z79899 Other long term (current) drug therapy: Secondary | ICD-10-CM | POA: Diagnosis not present

## 2023-05-30 LAB — COMPREHENSIVE METABOLIC PANEL
ALT: 18 U/L (ref 0–44)
AST: 22 U/L (ref 15–41)
Albumin: 4 g/dL (ref 3.5–5.0)
Alkaline Phosphatase: 58 U/L (ref 38–126)
Anion gap: 14 (ref 5–15)
BUN: 16 mg/dL (ref 6–20)
CO2: 23 mmol/L (ref 22–32)
Calcium: 9.1 mg/dL (ref 8.9–10.3)
Chloride: 104 mmol/L (ref 98–111)
Creatinine, Ser: 0.99 mg/dL (ref 0.61–1.24)
GFR, Estimated: >60 mL/min (ref >60)
Glucose, Bld: 134 mg/dL — ABNORMAL HIGH (ref 70–99)
Potassium: 3.5 mmol/L (ref 3.5–5.1)
Sodium: 141 mmol/L (ref 135–145)
Total Bilirubin: 0.5 mg/dL (ref 0.3–1.2)
Total Protein: 6.1 g/dL — ABNORMAL LOW (ref 6.5–8.1)

## 2023-05-30 LAB — ETHANOL: Alcohol, Ethyl (B): <10 mg/dL (ref <10)

## 2023-05-30 LAB — CBC
HCT: 43.4 % (ref 39.0–52.0)
Hemoglobin: 15.2 g/dL (ref 13.0–17.0)
MCH: 31 pg (ref 26.0–34.0)
MCHC: 35 g/dL (ref 30.0–36.0)
MCV: 88.6 fL (ref 80.0–100.0)
Platelets: 222 10*3/uL (ref 150–400)
RBC: 4.9 MIL/uL (ref 4.22–5.81)
RDW: 12.5 % (ref 11.5–15.5)
WBC: 8 10*3/uL (ref 4.0–10.5)
nRBC: 0 % (ref 0.0–0.2)

## 2023-05-30 MED ORDER — RISPERIDONE 2 MG PO TBDP
2.0000 mg | ORAL_TABLET | Freq: Three times a day (TID) | ORAL | Status: DC | PRN
  Administered 2023-05-30: 2 mg via ORAL
  Filled 2023-05-30: qty 1

## 2023-05-30 MED ORDER — RISPERIDONE 2 MG PO TBDP: 2.0000 mg | ORAL_TABLET | Freq: Three times a day (TID) | ORAL | Status: DC | PRN

## 2023-05-30 MED ORDER — ALUM & MAG HYDROXIDE-SIMETH 200-200-20 MG/5ML PO SUSP: 30.0000 mL | ORAL | Status: DC | PRN

## 2023-05-30 MED ORDER — LORAZEPAM 1 MG PO TABS
1.0000 mg | ORAL_TABLET | ORAL | Status: AC | PRN
  Administered 2023-05-30: 1 mg via ORAL
  Filled 2023-05-30: qty 1

## 2023-05-30 MED ORDER — HYDROXYZINE HCL 25 MG PO TABS
25.0000 mg | ORAL_TABLET | Freq: Three times a day (TID) | ORAL | Status: DC | PRN
  Administered 2023-05-30: 25 mg via ORAL
  Filled 2023-05-30: qty 1

## 2023-05-30 MED ORDER — STERILE WATER FOR INJECTION IJ SOLN
INTRAMUSCULAR | Status: AC
  Filled 2023-05-30: qty 10

## 2023-05-30 MED ORDER — LORAZEPAM 1 MG PO TABS: 1.0000 mg | ORAL_TABLET | ORAL | Status: DC | PRN

## 2023-05-30 MED ORDER — NICOTINE 21 MG/24HR TD PT24: 21.0000 mg | MEDICATED_PATCH | Freq: Every day | TRANSDERMAL | Status: DC

## 2023-05-30 MED ORDER — TRAZODONE HCL 50 MG PO TABS: 50.0000 mg | ORAL_TABLET | Freq: Every evening | ORAL | Status: DC | PRN

## 2023-05-30 MED ORDER — HYDROXYZINE HCL 25 MG PO TABS: 25.0000 mg | ORAL_TABLET | Freq: Three times a day (TID) | ORAL | Status: DC | PRN

## 2023-05-30 MED ORDER — MAGNESIUM HYDROXIDE 400 MG/5ML PO SUSP: 30.0000 mL | Freq: Every day | ORAL | Status: DC | PRN

## 2023-05-30 MED ORDER — ZIPRASIDONE MESYLATE 20 MG IM SOLR: 20.0000 mg | INTRAMUSCULAR | Status: DC | PRN

## 2023-05-30 MED ORDER — ZIPRASIDONE MESYLATE 20 MG IM SOLR
20.0000 mg | Freq: Two times a day (BID) | INTRAMUSCULAR | Status: DC | PRN
  Administered 2023-05-30: 20 mg via INTRAMUSCULAR
  Filled 2023-05-30: qty 20

## 2023-05-30 NOTE — Progress Notes (Signed)
LCSW Progress Note  [id number]   Gotti Kirsch Licciardi  05/30/2023  10:07 PM    Inpatient Behavioral Health Placement  Pt meets inpatient criteria per Lamar Sprinkles, MD. There are no available beds within [facility] per [facility] Kelly Southard,RN. Referral was sent to the following facilities;   Destination  Service Provider Address Phone [facility]  [address] [place], [place] [address] [phone]  [facility]  [address] [place] [address] [phone]  [facility]  [address], [place] [address] [phone]  [facility] Patient Placement  [facility] [place] [phone]  [facility]  [address] [place] [address] [phone]  [facility] [address]  [address] [place] [address] [phone]  [facility]  [address] [place], [place] [address] [phone]  [facility]  [address] [place] [address] [phone]  [facility]  [address] [place] [address] [phone]  [facility]  [address], [place] [address] [phone]  [facility]  [address], [place] [address] [phone]  [facility]  [address], [place] [address] [phone]  [facility]  [place] [phone]  [facility]  [address] [place] [address] [phone]  [facility]  [address] [place] [phone]  [facility]  [address] [place] [address] [phone]  [facility]  [address], [place] [address] [phone]  [facility]  [address], [place] [address] [phone]  [facility]  [address], [place] [address] [phone]  [facility]  [address] [place], [place] [address] [phone]  [facility]  [address], [place] [address] [phone]  [facility] [place]  [place] [phone]  [facility]  [facility] [place] [address] [phone]  [facility]  [address] [place] [address] [phone]  [facility]  [address] [place] [address] [phone]    Situation ongoing,  CSW will follow up.    Maryjean Ka, MSW, [facility] 05/30/2023 10:07 PM

## 2023-05-30 NOTE — Progress Notes (Cosign Needed)
Patient arrived for his injection of Invega Trinza 546mg . Given in Left Deltoid because patient refused his Upper Outer Quadrant. Patient appears paranoid, talking erratic, reading his bible but pleasant and cooperative. Denies SI/HI. States he has no issues and things are going well.

## 2023-05-30 NOTE — ED Provider Notes (Signed)
[place] EMERGENCY DEPARTMENT AT [facility] Provider Note   CSN: [id number] Arrival date & time: 05/30/23  1946     History  Chief Complaint  Patient presents with   IVC : Behavior Problems    Marc Hart is a 32 y.o. male.  Patient with history of schizophrenia, Invega injections regularly--presents from behavioral health today under involuntary commitment.  Patient states that he went in for his routine injections.  Per IVC paperwork, patient was disorganized, talking to himself, paranoid, speaking about strange things.  He was given oral risperidone and Ativan there and he states he feels more relaxed now.  They recommended inpatient psychiatric care after medical clearance.  Patient denies any recent medical problems other than some left upper arm pain.  He states that he thinks this is from a supplement that he bought at [facility] to help him become stronger.  He is unable to tell me exactly what that was.  He is able to move his arm fully and denies any injuries.       Home Medications Prior to Admission medications   Medication Sig Start Date End Date Taking? Authorizing Provider  alum & mag hydroxide-simeth (MAALOX/MYLANTA) 200-200-20 MG/5ML suspension Take 30 mLs by mouth every 4 (four) hours as needed for indigestion. 05/30/23   Lamar Sprinkles, MD  hydrOXYzine (ATARAX) 25 MG tablet Take 1 tablet (25 mg total) by mouth 3 (three) times daily as needed for anxiety. 05/30/23   Lamar Sprinkles, MD  magnesium hydroxide (MILK OF MAGNESIA) 400 MG/5ML suspension Take 30 mLs by mouth daily as needed for mild constipation. 05/30/23   Lamar Sprinkles, MD  nicotine (NICODERM CQ - DOSED IN MG/24 HOURS) 21 mg/24hr patch Place 1 patch (21 mg total) onto the skin daily at 6 (six) AM. 05/31/23   Lamar Sprinkles, MD  Paliperidone Palmitate ER (INVEGA TRINZA) 546 MG/1.75ML injection Inject 1.75 mLs (546 mg total) into the muscle every 3 (three) months. 02/27/23   Lauro Franklin,  MD  risperiDONE (RISPERDAL M-TABS) 2 MG disintegrating tablet Take 1 tablet (2 mg total) by mouth every 8 (eight) hours as needed (agitation). 05/30/23   Lamar Sprinkles, MD  traZODone (DESYREL) 50 MG tablet Take 1 tablet (50 mg total) by mouth at bedtime as needed for sleep. 05/30/23   Lamar Sprinkles, MD  ziprasidone (GEODON) 20 MG injection Inject 20 mg into the muscle as needed for agitation. 05/30/23   Lamar Sprinkles, MD      Allergies    Amoxicillin, Tylenol [acetaminophen], and Other    Review of Systems   Review of Systems  Physical Exam Updated Vital Signs BP 134/79   Pulse 79   Temp 97.8 F (36.6 C)   Resp 18   SpO2 99%  Physical Exam Vitals and nursing note reviewed.  Constitutional:      Appearance: He is well-developed.  HENT:     Head: Normocephalic and atraumatic.  Eyes:     Conjunctiva/sclera: Conjunctivae normal.  Pulmonary:     Effort: No respiratory distress.  Musculoskeletal:     Cervical back: Normal range of motion and neck supple.     Comments: Left upper arm is soft and nontender.  No signs of compartment syndrome.  Normal distal pulses.  Skin:    General: Skin is warm and dry.  Neurological:     Mental Status: He is alert.  Psychiatric:        Attention and Perception: Attention normal.  Mood and Affect: Affect is flat.        Speech: Speech normal.        Behavior: Behavior normal.        Thought Content: Thought content does not include homicidal or suicidal ideation.     ED Results / Procedures / Treatments   Labs (all labs ordered are listed, but only abnormal results are displayed) Labs Reviewed  COMPREHENSIVE METABOLIC PANEL - Abnormal; Notable for the following components:      Result Value   Glucose, Bld 134 (*)    Total Protein 6.1 (*)    All other components within normal limits  ETHANOL  CBC  RAPID URINE DRUG SCREEN, HOSP PERFORMED    EKG None  Radiology No results found.  Procedures Procedures     Medications Ordered in ED Medications  ziprasidone (GEODON) injection 20 mg (has no administration in time range)    And  LORazepam (ATIVAN) tablet 1 mg (has no administration in time range)    ED Course/ Medical Decision Making/ A&P    Patient seen and examined. History obtained directly from patient.  I also reviewed IVC paperwork and behavioral health notes.  Labs/EKG: Ordered CBC, CMP, ethanol, UDS.  Imaging: None ordered  Medications/Fluids: Ordered: P.o. Ativan as needed, IM Geodon as needed for agitation.   Most recent vital signs reviewed and are as follows: BP 134/79   Pulse 79   Temp 97.8 F (36.6 C)   Resp 18   SpO2 99%   Initial impression: Schizophrenia, paranoia  He has been evaluated by psych and inpatient hospitalization has been recommended.  11:07 PM   Patient is medically cleared.    CBC unremarkable; ethanol negative; CMP unremarkable.  UDS pending.                                Medical Decision Making  Patient IVC, accepted for inpatient psychiatric treatment.        Final Clinical Impression(s) / ED Diagnoses Final diagnoses:  Schizophrenia, unspecified type [facility])    Rx / DC Orders ED Discharge Orders     None         Renne Crigler, Cordelia Poche 05/30/23 2309    Rondel Baton, MD 05/31/23 1517

## 2023-05-30 NOTE — Progress Notes (Signed)
BH MD/PA/NP OP Progress Note  05/30/2023 7:25 PM Marc Hart  MRN:  [id number]  Chief Complaint:  Chief Complaint  Patient presents with   Follow-up   Other    LAI   Schizophrenia   HPI:  Marc Hart 32 yr. male with psychiatric history of paranoid schizophrenia, major depression, PTSD, anxiety, and insomnia.  He is seen in office today for follow up psychiatric evaluation and administration of long acting injectable.  His mental health is currently managed with Invega Trinza 561 mg every 3 months.   He reports he has continued to do well since last seen in the [facility] clinic.  He reports he feels "great" with the increase in dosage.  It is explained to him that his dosages roughly the same as his monthly injections, but he is now receiving it every 3 months instead.  He denies any issues or concerns as medication has begun to wear off.  However, on assessment, he displays mood lability, becoming easily irritable with and cursing at this writer when asking standard questions. He reports no SI, HI, AVH, nor paranoia.  He does state that there are things going on in the world that concern him, but he makes no paranoid nor unreasonable statements.  He reports sleep and appetite are intact.  He reports no other concerns at present.  He does make sexually inappropriate comments toward this Clinical research associate during physical exam, but is easily redirectable and apologizes for his comments.   After he is seen, patient remained in the lobby nearly 1 hour after his appointment.  Per front desk, he displays continued mood lability, pacing and talking to himself aloud.  He is assessed by this Clinical research associate and attending physician, Dr. Josephina Shih, and he has continued lability, nor is he organized enough to arrange a ride home.  He does say that he does not "feel right" and that he would like lab work.  He goes on to say "if you think I am not right and that I need to go to the hospital, then I will go."  He is agreeable  to walking downstairs to the behavioral health urgent care.  See [facility] note for further details.  Patient is ultimately recommended for inpatient admission after medical clearance from [facility] emergency department.  As well, see note for collateral from mom.   Visit Diagnosis:    ICD-10-CM   1. Schizophrenia, unspecified type (HCC)  F20.9     2. Long term current use of antipsychotic medication  Z79.899        Past Psychiatric History: Paranoid schizophrenia, schizoaffective disorder, Anxiety, Depression, Polysubstance abuse, cannabis use disorder mild.    Past Medical History:  Past Medical History:  Diagnosis Date   Schizophrenia (HCC)    Substance abuse (HCC)     Past Surgical History:  Procedure Laterality Date   OTHER SURGICAL HISTORY     Ear Surgery     Family Psychiatric History: None reported   Family History: No family history on file.  Social History:  Social History   Socioeconomic History   Marital status: Single    Spouse name: Not on file   Number of children: Not on file   Years of education: Not on file   Highest education level: Not on file  Occupational History   Not on file  Tobacco Use   Smoking status: Every Day    Current packs/day: 0.50    Types: Cigarettes   Smokeless tobacco: Never  Vaping Use  Vaping status: Every Day  Substance and Sexual Activity   Alcohol use: Yes    Comment: 1 every 2 weeks   Drug use: Yes    Types: Marijuana    Comment: daily   Sexual activity: Not on file  Other Topics Concern   Not on file  Social History Narrative   ** Merged History Encounter **       Social Determinants of Health   Financial Resource Strain: Not on file  Food Insecurity: Not on file  Transportation Needs: Not on file  Physical Activity: Not on file  Stress: Not on file  Social Connections: Unknown (01/13/2022)   [facility]   Social Network    Social Network: Not on file    Allergies:   Allergies  Allergen Reactions   Amoxicillin Anaphylaxis    Has patient had a PCN reaction causing immediate rash, facial/tongue/throat swelling, SOB or lightheadedness with hypotension: Yes Has patient had a PCN reaction causing severe rash involving mucus membranes or skin necrosis: No Has patient had a PCN reaction that required hospitalization No Has patient had a PCN reaction occurring within the last 10 years: Yes If all of the above answers are "NO", then may proceed with Cephalosporin use.   Tylenol [Acetaminophen] Itching    Knees itched   Other Rash    Allergic reaction to coppertone suntan lotion    Metabolic Disorder Labs: Lab Results  Component Value Date   HGBA1C 5.3 08/23/2022   MPG 99.67 12/08/2020   No results found for: "PROLACTIN" Lab Results  Component Value Date   CHOL 134 08/23/2022   TRIG 103 08/23/2022   HDL 39 (L) 08/23/2022   CHOLHDL 3.4 08/23/2022   VLDL 19 12/08/2020   LDLCALC 76 08/23/2022   LDLCALC 66 12/08/2020   Lab Results  Component Value Date   TSH 1.449 12/08/2020    Therapeutic Level Labs: No results found for: "LITHIUM" No results found for: "VALPROATE" No results found for: "CBMZ"  Current Medications: No current facility-administered medications for this visit.   Current Outpatient Medications  Medication Sig Dispense Refill   alum & mag hydroxide-simeth (MAALOX/MYLANTA) 200-200-20 MG/5ML suspension Take 30 mLs by mouth every 4 (four) hours as needed for indigestion.     hydrOXYzine (ATARAX) 25 MG tablet Take 1 tablet (25 mg total) by mouth 3 (three) times daily as needed for anxiety.     magnesium hydroxide (MILK OF MAGNESIA) 400 MG/5ML suspension Take 30 mLs by mouth daily as needed for mild constipation.     [START ON 05/31/2023] nicotine (NICODERM CQ - DOSED IN MG/24 HOURS) 21 mg/24hr patch Place 1 patch (21 mg total) onto the skin daily at 6 (six) AM.     Paliperidone Palmitate ER (INVEGA TRINZA) 546 MG/1.75ML injection  Inject 1.75 mLs (546 mg total) into the muscle every 3 (three) months. 1.8 mL 1   risperiDONE (RISPERDAL M-TABS) 2 MG disintegrating tablet Take 1 tablet (2 mg total) by mouth every 8 (eight) hours as needed (agitation).     traZODone (DESYREL) 50 MG tablet Take 1 tablet (50 mg total) by mouth at bedtime as needed for sleep.     ziprasidone (GEODON) 20 MG injection Inject 20 mg into the muscle as needed for agitation.     Facility-Administered Medications Ordered in Other Visits  Medication Dose Route Frequency Provider Last Rate Last Admin   alum & mag hydroxide-simeth (MAALOX/MYLANTA) 200-200-20 MG/5ML suspension 30 mL  30 mL Oral Q4H  PRN Lamar Sprinkles, MD       hydrOXYzine (ATARAX) tablet 25 mg  25 mg Oral TID PRN Lamar Sprinkles, MD   25 mg at 05/30/23 1731   magnesium hydroxide (MILK OF MAGNESIA) suspension 30 mL  30 mL Oral Daily PRN Lamar Sprinkles, MD       Melene Muller ON 05/31/2023] nicotine (NICODERM CQ - dosed in mg/24 hours) patch 21 mg  21 mg Transdermal Q0600 Lamar Sprinkles, MD       risperiDONE (RISPERDAL M-TABS) disintegrating tablet 2 mg  2 mg Oral Q8H PRN Lamar Sprinkles, MD   2 mg at 05/30/23 1702   And   ziprasidone (GEODON) injection 20 mg  20 mg Intramuscular PRN Lamar Sprinkles, MD       traZODone (DESYREL) tablet 50 mg  50 mg Oral QHS PRN Lamar Sprinkles, MD         Musculoskeletal: Strength & Muscle Tone: within normal limits Gait & Station: normal Patient leans: N/A  Psychiatric Specialty Exam: Review of Systems  Respiratory:  Negative for cough and shortness of breath.   Cardiovascular:  Negative for chest pain.  Gastrointestinal:  Negative for abdominal pain, constipation, diarrhea, nausea and vomiting.  Neurological:  Negative for weakness and headaches.  Psychiatric/Behavioral:  Negative for dysphoric mood, hallucinations, sleep disturbance and suicidal ideas. The patient is not nervous/anxious.     There were no vitals taken for this visit.There is no height  or weight on file to calculate BMI.  See nursing encounter for LAI visit  General Appearance: Casual and Disheveled  Eye Contact:  Fair  Speech:  Clear and Coherent  Volume:  Increased  Mood:   Labile  Affect:  Congruent and Labile  Thought Process:  Disorganized  Orientation:  Full (Time, Place, and Person)  Thought Content: Delusions and Tangential   Suicidal Thoughts:  No  Homicidal Thoughts:  No  Memory:  Immediate;   Fair Recent;   Fair  Judgement:  Impaired  Insight:  Lacking  Psychomotor Activity:  Normal  Concentration:  Concentration: Fair and Attention Span: Fair  Recall:  [employer] of Knowledge: Fair  Language: Fair  Akathisia:  Negative  Handed:  Right  AIMS (if indicated): Completed; AIMS= 0  Assets:  Communication Skills Desire for Improvement Financial Resources/Insurance Housing Physical Health Resilience Social Support Transportation  ADL's:  Intact  Cognition: Impaired,  Mild  Sleep:  Good   Screenings: AIMS    Flowsheet Row Clinical Support from 02/27/2023 in [facility] Office Visit from 02/22/2022 in [facility] Office Visit from 11/23/2021 in [facility]Dis[facility]) [facility]  AIMS Total Score 0 0 0 0 0      AUDIT    Flowsheet Row [facility]) [facility]  Alcohol Use Disorder Identification Test Final Score (AUDIT) 3      GAD-7    Flowsheet Row Clinical Support from 02/27/2023 in [facility] Office Visit from 11/21/2022 in [facility] Office Visit from 02/22/2022 in [facility] Office Visit from 11/23/2021 in [facility] Office Visit from  08/24/2021 in [facility]  Total GAD-7 Score 0 3 0 0 6      PHQ2-9    Flowsheet Row Clinical Support from  02/27/2023 in [facility] Office Visit from 11/21/2022 in [facility] Office Visit from 05/17/2022 in [facility] Office Visit from 02/22/2022 in [facility] Office Visit from 11/23/2021 in [facility]  PHQ-2 Total Score 0 0 0 0 0  PHQ-9 Total Score 0 0 -- -- --      Flowsheet Row ED from 01/15/2023 in [facility] Emergency Department at [facility] Office Visit from 02/22/2022 in [facility] Office Visit from 11/23/2021 in [facility]  C-SSRS RISK CATEGORY No Risk No Risk No Risk        Assessment and Plan:  Marc Hart 32 yr. male with psychiatric history of paranoid schizophrenia, major depression, PTSD, anxiety, and insomnia.  He is seen in office today for follow up psychiatric evaluation and administration of long acting injectable.  His mental health is currently managed with Invega Trinza 546 mg every 3 months.    Marc Hart reports he has been doing well on his [place].  However, on assessment, he displays mood lability and disorganized thoughts, with concerns for decompensation 2/2 medication wearing off early.  This is confirmed in collateral call with mom.  As well, she suspects substance use.  Due to patient's state, recommended for inpatient psychiatric hospitalization after medical clearance.  Advise for Eyvonne Mechanic to be given at 2-1/2 months (2 weeks early), per manufacturer's recommendations.  Additionally, as patient is a resident of [facility], recommend that he have subsequent follow-up in his area, as he would not be covered by [facility].  Referral to be placed to [facility] in [place].  Advise next provider to  consider switching to [facility], as patient does not appear to be an appropriate candidate for Eyvonne Mechanic.   1. Paranoid schizophrenia (HCC)  -Continue Invega Trinza 546 mg q2.5 months.    2. Polysubstance abuse (HCC)   3. Generalized anxiety disorder   4. PTSD (post-traumatic stress disorder)   5. Insomnia, unspecified type    Collaboration of Care: Dr. Josephina Shih, [facility], [facility]  Patient/Guardian was advised Release of Information must be obtained prior to any record release in order to collaborate their care with an outside provider. Patient/Guardian was advised if they have not already done so to contact the registration department to sign all necessary forms in order for [place] to release information regarding their care.   Consent: Patient/Guardian gives verbal consent for treatment and assignment of benefits for services provided during this visit. Patient/Guardian expressed understanding and agreed to proceed.    Lamar Sprinkles, MD 05/30/2023, 7:25 PM
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
## 2023-05-30 NOTE — Discharge Instructions (Addendum)

## 2023-05-30 NOTE — ED Provider Notes (Addendum)
Behavioral Health Urgent Care Medical Screening Exam  Patient Name: Marc Hart MRN: [id number] Date of Evaluation: 05/30/23 Chief Complaint:  Psychosis Diagnosis:  Final diagnoses:  Schizoaffective disorder, unspecified type (HCC)  Long term current use of antipsychotic medication    History of Present illness: Marc Hart is a 32 y.o. male. with a psychiatric history of schizophrenia who presents from [facility] clinic due to psychosis and mood lability. On assessment during clinic, patient was mostly appropriate in conversation with some instances of quick shift to anger and some instances where he had increased latency of response time.  He denied problems with sleeping and eating, became angry and cursed at this writer when inquired about physical symptoms including bowel habits.  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  A few seconds later, he reports that he got new positions and works 15 different jobs.  He reports smoking 1 ppd and initially denies drug and alcohol use, but later states that he drank 1 shot of vodka on Saturday and took 1 Adderall pill and Suboxone approximately a week and a half ago.  Denies all other substance use. He denies AVH and paranoia, but front desk staff observed him pacing, and speaking to himself nonsensically. As well, as the assessment progressed, he became increasingly disorganized in thought content, stating that he has a 18 year old son who lives with him, "but mom is not in the picture, dad is never around, and his grandmother is a Ambulance person."  After the appointment, he spent an hour in the lobby stating that he was calling an [employer]etto Goad but did not have a cell phone or any way to return home. He stated that he needed to call his mom but stated that his mother was evil and he did not want to talk with her although she was the only means of him returning home.  He then became emotional stating that he should call his dad instead.   When asking about his ride, he becomes irritable, stating "you did not care about my safety when I was homeless, so why the fuck up to you care now?"  He went on to say that if we "do not think he is right and needs to go to the hospital, he is okay with that."  He states that he "does not feel right," and would like to have labs drawn.  He is amenable to going downstairs to the behavioral health urgent care for further assessment.  While at the front desk, he asks about smoking, and states that he smoked marijuana just before coming in today.  While in the urgent care assessment room, patient is out of his room, pacing the hallway, and very loudly speaking.  He ramped up and became agitated, requiring an agitation as needed of Risperdal 2 mg, hydroxyzine 25 mg and Ativan 1 mg.  He did calm after this.  MHT reported that since he was in the assessment area, he had 2 emetic episodes.  We do believe that it is best for him to have medical clearance.   Collateral: Mom, Marc Hart [phone])- Reports that she believes that he began using meth or another synthetic drug again over the past couple of weeks. She states that in the couple of weeks prior to his injection, he becomes paranoid and talks about government conspiracies. His mind has not seemed as clear over the past three weeks, but typically it is about two weeks before injection is  due. She typically brings him to his appointments, but today, she provided an [employer]etto Goad for him. She is raising his 80 year old son. He does not work, but he is able to complete his ADLs. He does receive EBT benefits. He has been labile. She wonders if his symptoms are organic, substance-induced, or a combination of the two.  Her questions are answered, and she is appreciative of call.    Flowsheet Row ED from 01/15/2023 in [facility] Emergency Department at [facility] Office Visit from 02/22/2022 in [facility] Office Visit from  11/23/2021 in [facility]  C-SSRS RISK CATEGORY No Risk No Risk No Risk       Psychiatric Specialty Exam  Presentation  General Appearance:Disheveled; Casual  Eye Contact:Fair  Speech:Clear and Coherent  Speech Volume:Increased  Handedness:Right   Mood and Affect  Mood:Labile  Affect:Congruent; Labile   Thought Process  Thought Processes:Disorganized  Descriptions of Associations:Tangential  Orientation:Full (Time, Place and Person)  Thought Content:Delusions; Paranoid Ideation   Duration of Psychotic Symptoms: Greater than six months  Hallucinations:None (Denies, but observed speaking to himself)  Ideas of Reference:Percusatory; Paranoia  Suicidal Thoughts:No  Homicidal Thoughts:No   Sensorium  Memory:Immediate Fair; Recent Fair  Judgment:Impaired  Insight:Poor   Executive Functions  Concentration:Fair  Attention Span:Fair  Recall:Fair  [employer] of Knowledge:Fair  Language:Fair   Psychomotor Activity  Psychomotor Activity:Normal   Assets  Assets:Desire for Improvement; Housing; Resilience; Social Support   Sleep  Sleep:Fair  Number of hours: No data recorded  Physical Exam: Physical Exam Vitals reviewed.  Constitutional:      General: He is not in acute distress.    Appearance: He is not ill-appearing.  HENT:     Head: Normocephalic and atraumatic.  Pulmonary:     Effort: Pulmonary effort is normal.  Skin:    General: Skin is warm and dry.  Neurological:     Mental Status: He is alert.     Motor: No weakness.     Gait: Gait normal.    Review of Systems  Constitutional:  Negative for malaise/fatigue.  Gastrointestinal: Negative.   Genitourinary: Negative.   Neurological:  Negative for tremors, weakness and headaches.   Blood pressure 129/73, pulse 64, temperature 97.7 F (36.5 C), temperature source Oral, resp. rate 20, SpO2 100%. There is no height or weight on file to calculate  BMI.  Musculoskeletal: Strength & Muscle Tone: within normal limits Gait & Station: normal Patient leans: N/A   [facility] MSE Discharge Disposition for Follow up and Recommendations: Based on my evaluation I certify that psychiatric inpatient services furnished can reasonably be expected to improve the patient's condition which I recommend transfer to an appropriate accepting facility.  Based on my evaluation the patient appears to have an emergency medical condition for which I recommend the patient be transferred to the emergency department for further evaluation.   #Schizophrenia, R/O schizoaffective disorder -Eyvonne Mechanic injection received today -Agitation protocol received at 1730: Risperdal 2 mg, Ativan 1 mg, hydroxyzine 25 mg  -Advised agitation protocol to be limited to Risperdal, as patient taking paliperidone.  -May need an oral bridge of medications  -Labs not yet drawn prior to transfer: Advise UDS, EKG, ethanol, TSH, RPR, prolactin, lipid panel, CMP, CBC, acute hepatitis panel and HIV, as unsure if patient is using again and whether those drugs are IV.  Patient under IVC.  Recommended for inpatient psychiatric hospitalization   Lamar Sprinkles, MD 05/30/2023, 6:50 PM

## 2023-05-30 NOTE — ED Notes (Signed)
Report has been given to [facility] ED Charge nurse, non-emergency police transport has been called to transport patient to the emergency room.

## 2023-05-30 NOTE — ED Triage Notes (Signed)
Patient arrived with 2 [employer] officers IVC  from Behavior Health unit due to mood swings , anger , and disorganized thoughts today . Denies SI or HI . History of schizophrenia and substance abuse.

## 2023-05-30 NOTE — ED Notes (Signed)
IVC paperwork complete and in blue zone, expires 06/06/23, case # [id number]

## 2023-05-30 NOTE — Progress Notes (Signed)
   05/30/23 1650  [facility] Triage Screening (Walk-ins at [facility] only)  How Did You Hear About [place]? Self  What Is the Reason for Your Visit/Call Today? Pt presents to [facility] voluntarily accompanied by outpatient providers. Pt is unable to be triaged at this time due to his disorganized thoughts.  How Long Has This Been Causing You Problems? <Week  Have You Recently Had Any Thoughts About Hurting Yourself?  (UTA)  Are You Planning to Commit Suicide/Harm Yourself At This time?  (UTA)  Have you Recently Had Thoughts About Hurting Someone Else?  (UTA)  Are You Planning To Harm Someone At This Time?  (UTA)  Are you currently experiencing any auditory, visual or other hallucinations?  (UTA)  Have You Used Any Alcohol or Drugs in the Past 24 Hours?  (UTA)  Do you have any current medical co-morbidities that require immediate attention?  (UTA)  Clinician description of patient physical appearance/behavior: angry, unwilling to cooperate  What Do You Feel Would Help You the Most Today? Treatment for Depression or other mood problem  If access to [facility] Urgent Care was not available, would you have sought care in the Emergency Department? Yes  Determination of Need Emergent (2 hours)  Options For Referral Inpatient Hospitalization

## 2023-05-30 NOTE — Progress Notes (Signed)
Pt was accepted to [facility] TOMORROW 05/31/2023; Bed Assignment [facility] Fax Number: [phone] (Adult)   Pt meets inpatient criteria per Lamar Sprinkles, MD     Attending Physician will be Dr. Loni Beckwith      Report can be called to:[phone]-Pager number, please leave a returned phone number to receive a phone call back.      Pt can arrive after 9:00am   Care Team notified:Julia Gordy Levan, MSW, [facility] 05/30/2023 10:42 PM

## 2023-05-30 NOTE — ED Notes (Signed)
IVC paperwork complete and in purple zone, expires 06/06/23, case # [id number]

## 2023-05-31 MED ORDER — LORAZEPAM 1 MG PO TABS
2.0000 mg | ORAL_TABLET | ORAL | Status: AC | PRN
  Administered 2023-05-31: 2 mg via ORAL
  Filled 2023-05-31: qty 2

## 2023-05-31 MED ORDER — ZIPRASIDONE MESYLATE 20 MG IM SOLR: 20.0000 mg | Freq: Two times a day (BID) | INTRAMUSCULAR | Status: DC | PRN

## 2023-05-31 NOTE — ED Notes (Signed)
Report given to Cyndia Bent at [facility]. Please send pt after 9am and call [phone] option #2 to leave message when pt leaving.

## 2023-05-31 NOTE — ED Notes (Signed)
Pt agitated after telling pt he is going to [facility]. Pt reports no one told him prior to this RN. Pt accusing "yall are going to make me lose my house", "yall just keep taking", "it's all about money". Informed pt that this RN will get pt medication.

## 2023-05-31 NOTE — ED Notes (Signed)
Called sheriffs office  ETA at 10am, Talk to [place]

## 2023-05-31 NOTE — ED Notes (Signed)
Pt paranoid. Pt said that he is normally not ok and he is not going to the doctor to get help. Sheriff at bedside trying to speak w/ pt to calm him down.

## 2023-05-31 NOTE — ED Notes (Signed)
Pt calm and cooperative at time of departure. All belongings and paperwork given to [facility]. VSS, ambulatory w/ steady gait.

## 2023-05-31 NOTE — ED Notes (Signed)
Called [facility] to notify of pt departure, per report given to this RN at time of taking over pt care.

## 2023-05-31 NOTE — ED Notes (Addendum)
Received call back from [facility]. Updated staff on pt VS, behavior, prn meds given and time of departure. Updated report accepted by [facility]. E.Orphia Mctigue [phone]

## 2023-05-31 NOTE — ED Provider Notes (Signed)
Emergency Medicine Observation Re-evaluation Note  Jazir Newey Schatz is a 32 y.o. male, seen on rounds today.  Pt initially presented to the ED for complaints of IVC : Behavior Problems Currently, the patient is sitting upright in bed.  Staring at the wall.  I was informed by nursing that he has been accepted for psychiatric care today.  Physical Exam  BP 123/69 (BP Location: Left Arm)   Pulse 72   Temp 97.8 F (36.6 C) (Oral)   Resp 16   Ht 5\' 11"  (1.803 m)   Wt 100 kg   SpO2 99%   BMI 30.75 kg/m  Physical Exam General: Appears to be resting comfortably in bed, no acute distress. Cardiac: Regular rate, normal heart rate, non-emergent blood pressure for this morning's vitals. Lungs: No increased work of breathing.  Equal chest rise appreciated Psych: Calm, asleep in bed.   ED Course / MDM  EKG:   I have reviewed the labs performed to date as well as medications administered while in observation..  Plan  Current plan is for psychiatric placement.  Patient medically cleared by prior shift.Glyn Ade, MD 05/31/23 [phone]

## 2023-06-03 ENCOUNTER — Other Ambulatory Visit (HOSPITAL_COMMUNITY): Payer: No Payment, Other

## 2023-07-05 ENCOUNTER — Ambulatory Visit: Payer: Self-pay | Admitting: Family Medicine

## 2023-07-25 ENCOUNTER — Ambulatory Visit (HOSPITAL_COMMUNITY): Payer: No Payment, Other | Admitting: *Deleted

## 2023-07-25 ENCOUNTER — Encounter (HOSPITAL_COMMUNITY): Payer: Self-pay

## 2023-07-25 ENCOUNTER — Telehealth (HOSPITAL_COMMUNITY): Payer: Self-pay

## 2023-07-25 ENCOUNTER — Ambulatory Visit (HOSPITAL_COMMUNITY): Payer: No Payment, Other | Admitting: Psychiatry

## 2023-07-25 VITALS — BP 126/75 | HR 75 | Resp 16 | Ht 71.0 in | Wt 223.8 lb

## 2023-07-25 DIAGNOSIS — F411 Generalized anxiety disorder: Secondary | ICD-10-CM | POA: Diagnosis not present

## 2023-07-25 DIAGNOSIS — F1910 Other psychoactive substance abuse, uncomplicated: Secondary | ICD-10-CM

## 2023-07-25 DIAGNOSIS — G4700 Insomnia, unspecified: Secondary | ICD-10-CM

## 2023-07-25 DIAGNOSIS — F200 Paranoid schizophrenia: Secondary | ICD-10-CM

## 2023-07-25 DIAGNOSIS — F209 Schizophrenia, unspecified: Secondary | ICD-10-CM

## 2023-07-25 MED ORDER — HYDROXYZINE HCL 25 MG PO TABS: 25.0000 mg | ORAL_TABLET | Freq: Three times a day (TID) | ORAL | 3 refills | Status: DC | PRN

## 2023-07-25 MED ORDER — INVEGA TRINZA 546 MG/1.75ML IM SUSY
546.0000 mg | PREFILLED_SYRINGE | INTRAMUSCULAR | 11 refills | Status: DC
Start: 2023-07-25 — End: 2023-12-02

## 2023-07-25 MED ORDER — TRAZODONE HCL 50 MG PO TABS: 50.0000 mg | ORAL_TABLET | Freq: Every evening | ORAL | 3 refills | Status: DC | PRN

## 2023-07-25 NOTE — Telephone Encounter (Signed)
Allie from the [facility] called in to see about getting pt transferred from [facility] to [place] due to his location. Please review

## 2023-07-25 NOTE — Progress Notes (Signed)
BH MD/PA/NP OP Progress Note  07/25/2023 12:06 PM Marc Hart  MRN:  [id number]  Chief Complaint: "I want my injection"  HPI: 32 year old male seen today for follow-up psychiatric evaluation.  He has a psychiatric history of substance-induced mood disorder, anxiety, schizoaffective disorder, depression, schizophrenia, PTSD, insomnia, polysubstance use, alcohol dependence, and SI.  Patient presented to the clinic today requesting his Invega Trinza 546 mg injection.    Today he was well-groomed, pleasant, cooperative, and engaged in conversation.  Patient informed Clinical research associate that he traveled from [place] to get his injection today.  Provider informed patient that he was a month early for injection and informed him that it cannot be administered today.  Provider also informed patient that this clinic is for [facility] resident and informed him that he would be referred to the [place] office.  Provider called the regional office while patient was in the clinic and was informed that they will call the patient to schedule an appointment for medication administration.  After patient left the office provider was informed that the patient would need to be referred to [facility] in [place] [phone], [facility] in [place] [phone], or [facility] in [place] [phone].  Provider reached out to patient and his mother and informed them of information.  They informed writer that they went through this process before and was told that they could not be seen.  Provider reached out to administrative staff for further instructions.  Patients mother notes that she will call the clinic for further information if she is unable to get him in with another behavioral health provider.  Since his last visit he informed writer that he has been extremely anxious.  He notes that he worries about his living situation, his family, and his children.  Provider conducted a GAD-7 and patient and patient  scored a 21.  Provider also conducted PHQ-9 and patient scored a 14.  Patient notes that his sleep has been poor.  Patient also notes that his appetite has been increased and reports that he has gained several pounds since his last visit.  Today he denies SI/HI/VH.  He does endorse auditory hallucinations.  Patient informed Clinical research associate that he copes with his anxiety and depression by smoking marijuana daily.  He also notes that he drinks alcohol several times a week.  Patient reports that he has not used heroin in a couple years.  He does note in the past he misused Percocets.  Today patient agreeable to restarting hydroxyzine 25 mg 3 times daily to help manage anxiety.  Trazodone 50 mg reordered today to help manage sleep.  Patient's mother will call the above psychiatric clinics to schedule future mental health care.  No other concerns at this time.   Visit Diagnosis:    ICD-10-CM   1. Generalized anxiety disorder  F41.1 hydrOXYzine (ATARAX) 25 MG tablet    2. Paranoid schizophrenia (HCC)  F20.0 Paliperidone Palmitate ER (INVEGA TRINZA) 546 MG/1.75ML injection    3. Polysubstance abuse (HCC)  F19.10     4. Insomnia, unspecified type  G47.00 traZODone (DESYREL) 50 MG tablet      Past Psychiatric History: substance-induced mood disorder, anxiety, schizoaffective disorder, depression, schizophrenia, PTSD, insomnia, polysubstance use, alcohol dependence, and SI.  Past Medical History:  Past Medical History:  Diagnosis Date   Schizophrenia (HCC)    Substance abuse (HCC)     Past Surgical History:  Procedure Laterality Date   OTHER SURGICAL HISTORY     Ear Surgery  Family Psychiatric History: None reported   Family History: No family history on file.  Social History:  Social History   Socioeconomic History   Marital status: Single    Spouse name: Not on file   Number of children: Not on file   Years of education: Not on file   Highest education level: Not on file  Occupational  History   Not on file  Tobacco Use   Smoking status: Every Day    Current packs/day: 0.50    Types: Cigarettes   Smokeless tobacco: Never  Vaping Use   Vaping status: Every Day  Substance and Sexual Activity   Alcohol use: Yes    Comment: 1 every 2 weeks   Drug use: Yes    Types: Marijuana    Comment: daily   Sexual activity: Not on file  Other Topics Concern   Not on file  Social History Narrative   ** Merged History Encounter **       Social Determinants of Health   Financial Resource Strain: Not on file  Food Insecurity: Not on file  Transportation Needs: Not on file  Physical Activity: Not on file  Stress: Not on file  Social Connections: Unknown (01/13/2022)   [facility]   Social Network    Social Network: Not on file    Allergies:  Allergies  Allergen Reactions   Amoxicillin Anaphylaxis    Has patient had a PCN reaction causing immediate rash, facial/tongue/throat swelling, SOB or lightheadedness with hypotension: Yes Has patient had a PCN reaction causing severe rash involving mucus membranes or skin necrosis: No Has patient had a PCN reaction that required hospitalization No Has patient had a PCN reaction occurring within the last 10 years: Yes If all of the above answers are "NO", then may proceed with Cephalosporin use.   Tylenol [Acetaminophen] Itching    Knees itched   Other Rash    Allergic reaction to coppertone suntan lotion    Metabolic Disorder Labs: Lab Results  Component Value Date   HGBA1C 5.3 08/23/2022   MPG 99.67 12/08/2020   No results found for: "PROLACTIN" Lab Results  Component Value Date   CHOL 134 08/23/2022   TRIG 103 08/23/2022   HDL 39 (L) 08/23/2022   CHOLHDL 3.4 08/23/2022   VLDL 19 12/08/2020   LDLCALC 76 08/23/2022   LDLCALC 66 12/08/2020   Lab Results  Component Value Date   TSH 1.449 12/08/2020    Therapeutic Level Labs: No results found for: "LITHIUM" No results found for:  "VALPROATE" No results found for: "CBMZ"  Current Medications: Current Outpatient Medications  Medication Sig Dispense Refill   alum & mag hydroxide-simeth (MAALOX/MYLANTA) 200-200-20 MG/5ML suspension Take 30 mLs by mouth every 4 (four) hours as needed for indigestion.     hydrOXYzine (ATARAX) 25 MG tablet Take 1 tablet (25 mg total) by mouth 3 (three) times daily as needed for anxiety. 90 tablet 3   magnesium hydroxide (MILK OF MAGNESIA) 400 MG/5ML suspension Take 30 mLs by mouth daily as needed for mild constipation.     nicotine (NICODERM CQ - DOSED IN MG/24 HOURS) 21 mg/24hr patch Place 1 patch (21 mg total) onto the skin daily at 6 (six) AM.     Paliperidone Palmitate ER (INVEGA TRINZA) 546 MG/1.75ML injection Inject 1.75 mLs (546 mg total) into the muscle every 3 (three) months. 1.8 mL 11   risperiDONE (RISPERDAL M-TABS) 2 MG disintegrating tablet Take 1 tablet (2 mg total)  by mouth every 8 (eight) hours as needed (agitation).     traZODone (DESYREL) 50 MG tablet Take 1 tablet (50 mg total) by mouth at bedtime as needed for sleep. 30 tablet 3   ziprasidone (GEODON) 20 MG injection Inject 20 mg into the muscle as needed for agitation.     No current facility-administered medications for this visit.     Musculoskeletal: Strength & Muscle Tone: within normal limits Gait & Station: unsteady, uses a cane Patient leans: N/A  Psychiatric Specialty Exam: Review of Systems  There were no vitals taken for this visit.There is no height or weight on file to calculate BMI.  General Appearance: Well Groomed  Eye Contact:  Good  Speech:  Clear and Coherent and Normal Rate  Volume:  Normal  Mood:  Anxious and Depressed  Affect:  Appropriate and Congruent  Thought Process:  Coherent, Goal Directed, and Linear  Orientation:  Full (Time, Place, and Person)  Thought Content: Logical and Hallucinations: Auditory   Suicidal Thoughts:  No  Homicidal Thoughts:  No  Memory:  Immediate;    Good Recent;   Good Remote;   Good  Judgement:  Good  Insight:  Good  Psychomotor Activity:  Normal  Concentration:  Concentration: Good and Attention Span: Good  Recall:  Good  Fund of Knowledge: Good  Language: Good  Akathisia:  No  Handed:  Right  AIMS (if indicated): not done  Assets:  Communication Skills Desire for Improvement Housing Leisure Time Social Support  ADL's:  Intact  Cognition: WNL  Sleep:  Fair   Screenings: AIMS    Flowsheet Row Clinical Support from 02/27/2023 in [facility] Office Visit from 02/22/2022 in [facility] Office Visit from 11/23/2021 in [facility]) [facility]) [facility]  AIMS Total Score 0 0 0 0 0      AUDIT    Flowsheet Row [facility]) [facility]  Alcohol Use Disorder Identification Test Final Score (AUDIT) 3      GAD-7    Flowsheet Row Office Visit from 07/25/2023 in [facility] Clinical Support from 02/27/2023 in [facility] Office Visit from 11/21/2022 in [facility] Office Visit from 02/22/2022 in [facility] Office Visit from 11/23/2021 in [facility]  Total GAD-7 Score 21 0 3 0 0      PHQ2-9    Flowsheet Row Office Visit from 07/25/2023 in [facility] Clinical Support from 02/27/2023 in [facility] Office Visit from 11/21/2022 in [facility] Office Visit from 05/17/2022 in [facility] Office Visit from 02/22/2022 in [facility]  PHQ-2 Total Score 3 0 0 0 0   PHQ-9 Total Score 14 0 0 -- --      Flowsheet Row ED from 05/30/2023 in [facility] Emergency Department at [facility] ED from 01/15/2023 in [facility] Emergency Department at [facility] Office Visit from 02/22/2022 in [facility]  C-SSRS RISK CATEGORY No Risk No Risk No Risk        Assessment and Plan: Patient endorses symptoms of anxiety, depression, and auditory hallucinations.  He does note that he utilizes marijuana and  alcohol frequently.  Patient presents to the clinic requesting to get his Eyvonne Mechanic injection.  Patient is a month early for his injection and at this time provider informed patient that at this time it cannot be administered.  Patient is also not a [facility] resident.  He resides in [facility] [place] [facility].  Provider informed patient that the [place] office was more appropriate.  At this time the [place] office is not accepting new patients and informed writer that she referred patient to [facility] in [place] [phone], [facility] in [place] [phone], or [facility] in [place] [phone].  Provider gave patient's mother the resources that as the patient left the clinic prior to getting this information.  Today he is agreeable to restarting trazodone 50 mg nightly as needed to help manage sleep.  He is also agreeable to restarting hydroxyzine 25 mg 3 times daily to help manage anxiety.  Patient's mother notes that she will follow-up with above behavioral health clinic to schedule his next injection.  1. Paranoid schizophrenia (HCC)  Continue- Paliperidone Palmitate ER (INVEGA TRINZA) 546 MG/1.75ML injection; Inject 1.75 mLs (546 mg total) into the muscle every 3 (three) months.  Dispense: 1.8 mL; Refill: 11  2. Generalized anxiety disorder  Restart- hydrOXYzine (ATARAX) 25 MG tablet; Take 1 tablet (25 mg total) by mouth 3 (three) times daily as needed for anxiety.  Dispense: 90 tablet; Refill: 3  3.  Polysubstance abuse (HCC)   4. Insomnia, unspecified type  Restart- traZODone (DESYREL) 50 MG tablet; Take 1 tablet (50 mg total) by mouth at bedtime as needed for sleep.  Dispense: 30 tablet; Refill: 3    Collaboration of Care: Collaboration of Care: Other provider involved in patient's care AEB PCP  Patient/Guardian was advised Release of Information must be obtained prior to any record release in order to collaborate their care with an outside provider. Patient/Guardian was advised if they have not already done so to contact the registration department to sign all necessary forms in order for [place] to release information regarding their care.   Consent: Patient/Guardian gives verbal consent for treatment and assignment of benefits for services provided during this visit. Patient/Guardian expressed understanding and agreed to proceed.    Shanna Cisco, NP 07/25/2023, 12:06 PM

## 2023-07-25 NOTE — Progress Notes (Cosign Needed)
Patient arrived for his injection of Invega Trinza 546mg . States he is doing well. He is walking with a cane and states he stopped doing drugs and had a lot of health problems and it helps him to keep balance. States he had a stroke and heart attack years ago. Denies SI/HI or AV hallucinations today. Admits he sometimes see's people naked. Upon chart review he is 1 month early for his injection. Notified MD and was told to not give injection. Also discussed his need to follow up in his county since he drives so far for his injections. Called the [place] office and spoke with Lajoyce Corners who states they will reach out to him and sent a message to Dr. Adrian Blackwater. Patient agreeable to switching providers to be closer to his home.

## 2023-07-29 ENCOUNTER — Telehealth (HOSPITAL_COMMUNITY): Payer: Self-pay | Admitting: Psychiatry

## 2023-07-29 NOTE — Telephone Encounter (Signed)
Patient was given refills of invega, trazodone, and hydroxyzine on 11/21. [facility] [place], [place] - [address] [place], [place] [address]

## 2023-08-20 ENCOUNTER — Other Ambulatory Visit (HOSPITAL_COMMUNITY): Payer: Self-pay | Admitting: Psychiatry

## 2023-08-20 DIAGNOSIS — G4700 Insomnia, unspecified: Secondary | ICD-10-CM

## 2023-08-20 DIAGNOSIS — F411 Generalized anxiety disorder: Secondary | ICD-10-CM

## 2023-08-29 ENCOUNTER — Telehealth (HOSPITAL_COMMUNITY): Payer: Self-pay

## 2023-08-29 NOTE — Telephone Encounter (Signed)
Medication problem - Spoke to patient 2 more times this date as I was trying to work out him coming in to get his due Eyvonne Mechanic injection from the [facility] or our [place] office today but patient could not work out any transportation until this coming Monday, 09/02/23.  Patient to come in that date at 2 pm and agreed to then assist with transition plans to the [place] office to [facility], NP that will be starting there in the coming month for later follow up appointments.

## 2023-08-29 NOTE — Telephone Encounter (Signed)
Medication problem - Telephone call with patient, after receiving a call from patient's Mother that they have been unsuccessful in getting patient in with any new provider in the [facility] area and is due his [place] injection.  No patient information shared with patient's Mother, due to no active consent so just took information and did not verify if patient even known at this time.  Patient did admit to the problems of getting in with a new provider in [facility] and stated he has his medication but no one to give it to him.  Agreed to make some calls to help patient get scheduled and will call patient back.

## 2023-09-02 ENCOUNTER — Encounter (HOSPITAL_COMMUNITY): Payer: Self-pay

## 2023-09-02 ENCOUNTER — Ambulatory Visit (INDEPENDENT_AMBULATORY_CARE_PROVIDER_SITE_OTHER): Payer: MEDICAID

## 2023-09-02 VITALS — BP 124/69 | HR 79 | Temp 98.2°F | Ht 70.25 in | Wt 228.0 lb

## 2023-09-02 DIAGNOSIS — F200 Paranoid schizophrenia: Secondary | ICD-10-CM | POA: Diagnosis not present

## 2023-09-02 MED ORDER — PALIPERIDONE PALMITATE ER 546 MG/1.75ML IM SUSY
546.0000 mg | PREFILLED_SYRINGE | Freq: Once | INTRAMUSCULAR | Status: AC
Start: 2023-09-02 — End: 2023-09-02
  Administered 2023-09-02: 546 mg via INTRAMUSCULAR

## 2023-09-02 NOTE — Progress Notes (Cosign Needed)
Patient in today for due Marc Hart IM every 3 month injection, last given 05/30/23.  Patient presented with flat affect, level mood and denied any current auditory or visual hallucinations, no suicidal or homicidal ideations and no plans, intent or means to want to harm self or others at this time.  Patient reported he does hear things at times but reported this was not constant or manageable and none at currently.  Patient admitted he still uses THC most days and does drink approximately a pint of liquor per week.  Patient driven to [facility] by his Mother today but stated he still did not want to sign a consent for her but was okay with this nurse communicating his next arranged appointment with Assunta Found, NP to begin as a new patient at our [place] office on Monday 12/01/23 and to then obtain his next injection that day after, which patient agreed to pick up prior to that appointment.  Patient's Invega Trinza 546 mg/1.75 ml IM every 3 month injection prepared as ordered and given to patient in his requested Right Deltoid area.  Patient tolerated due injection without complaint of pain or discomfort and agreed to call our office back if any issues prior to next appointment at our [place] office on 12/01/23.  Patient left with his Mother and both aware of next appointment  and directions to go to our [facility]patient on 12/01/23.

## 2023-09-02 NOTE — Telephone Encounter (Signed)
Request sent 

## 2023-09-24 ENCOUNTER — Other Ambulatory Visit (HOSPITAL_COMMUNITY): Payer: Self-pay | Admitting: Psychiatry

## 2023-09-24 DIAGNOSIS — F411 Generalized anxiety disorder: Secondary | ICD-10-CM

## 2023-09-24 DIAGNOSIS — G4700 Insomnia, unspecified: Secondary | ICD-10-CM

## 2023-11-28 ENCOUNTER — Telehealth (HOSPITAL_COMMUNITY): Payer: Self-pay

## 2023-11-28 NOTE — Telephone Encounter (Signed)
 Lvm to confirm 12/02/23 appt by 12 pm 11/29/23

## 2023-12-02 ENCOUNTER — Telehealth (HOSPITAL_COMMUNITY): Payer: Self-pay

## 2023-12-02 ENCOUNTER — Ambulatory Visit (HOSPITAL_COMMUNITY): Payer: No Payment, Other | Admitting: Registered Nurse

## 2023-12-02 ENCOUNTER — Encounter (HOSPITAL_COMMUNITY): Payer: Self-pay | Admitting: Registered Nurse

## 2023-12-02 ENCOUNTER — Ambulatory Visit (INDEPENDENT_AMBULATORY_CARE_PROVIDER_SITE_OTHER): Payer: No Payment, Other | Admitting: Registered Nurse

## 2023-12-02 VITALS — BP 109/71 | HR 74 | Ht 70.25 in | Wt 239.4 lb

## 2023-12-02 DIAGNOSIS — G4700 Insomnia, unspecified: Secondary | ICD-10-CM

## 2023-12-02 DIAGNOSIS — F200 Paranoid schizophrenia: Secondary | ICD-10-CM

## 2023-12-02 DIAGNOSIS — F411 Generalized anxiety disorder: Secondary | ICD-10-CM | POA: Diagnosis not present

## 2023-12-02 MED ORDER — HYDROXYZINE HCL 25 MG PO TABS: 25.0000 mg | ORAL_TABLET | Freq: Three times a day (TID) | ORAL | 1 refills | Status: DC | PRN

## 2023-12-02 MED ORDER — TRAZODONE HCL 50 MG PO TABS
50.0000 mg | ORAL_TABLET | Freq: Every evening | ORAL | 1 refills | Status: DC | PRN
Start: 2023-12-02 — End: 2024-03-02

## 2023-12-02 MED ORDER — INVEGA TRINZA 546 MG/1.75ML IM SUSY
546.0000 mg | PREFILLED_SYRINGE | INTRAMUSCULAR | 1 refills | Status: DC
Start: 2023-12-02 — End: 2024-03-02

## 2023-12-02 NOTE — Progress Notes (Signed)
 Psychiatric Initial Adult Assessment   Patient Identification: Marc Hart MRN:  [id number] Date of Evaluation:  12/02/2023 Referral Source: Transfer care from [facility] Chief Complaint:   Chief Complaint  Patient presents with   Establish Care    Medication management   Visit Diagnosis:    ICD-10-CM   1. Paranoid schizophrenia (HCC)  F20.0 Paliperidone Palmitate ER (INVEGA TRINZA) 546 MG/1.75ML injection    CBC with Differential    TSH    Comprehensive metabolic panel with GFR    HgB A1c    Lipid panel    Prolactin    2. Generalized anxiety disorder  F41.1 hydrOXYzine (ATARAX) 25 MG tablet    3. Insomnia, unspecified type  G47.00 traZODone (DESYREL) 50 MG tablet      History of Present Illness:  Marc Hart 33 y.o. male presents to office today for psychiatric evaluation and to establish care for medication management.  He is seen face to face by this provider, and chart reviewed on 12/02/23.  He has a psychiatric history of substance-induced mood disorder, anxiety, schizoaffective disorder, depression, schizophrenia, PTSD, insomnia, polysubstance use, alcohol dependence, and suicidal ideation.   His mental health is currently managed with Invega Trinza 546 mg Q 3 months, Hydroxyzine 25 mg Tid prn, and Trazodone 50 mg Q hs prn.  He reports current medication regimen is managing mental health well without adverse reaction.    He doesn't have his medication Eyvonne Mechanic with him today for injection.  Reports he has been by the pharmacy several times and it hasn't come in.  He states he doesn't have his own transportation, depends on others to take him where he needs to go, so it is difficulty "running up and down the road."  [facility] [place] [facility] would be in today, if not would see if sample available.   Current stressors "Things have been difficult.  I'm paying for a lot of mistakes that I've made but I'm  trying to be a better person.  I don't have my own transportation, no job, I'm having a hard time trying to pay my bills.  I have to walk everywhere I go and it causing my feet to start cracking."   He reports he is currently living alone.  He is brought to visit today by his mother and spoke to his father about bring him back tomorrow for [employer] injection ([facility] Tolbert D Mullen to pick up). Today he denies suicidal/self-harm/homicidal ideation, psychosis, paranoia abnormal movements.  He reports periods of irritability related to his current situation and having to depend on other.  He reports he was denied disability and doesn't understand why "I know other people that have schizophrenia and on disability.  He reports it has been difficulty to hold a job because "there is always something."  No further elaboration.  PHQ 2/9, C-SSRS, GAD &, AUDIT, and AIMS screenings conducted during today's visit, see scores below.  He reports he is eating and sleeping without difficulty.      Recommended the following:  Return 12/03/2023 for Invega Trinza injection.  Continue current medication regimen Invega Trinza 546 mg Q 3 months, Hydroxyzine 25 mg Tid prn, and Trazodone 50 mg Q hs prn.  Follow up for medication management and next Invega Trinza injection in 3 months.   Educational information on [employer], Trazodone, and Vistaril side effect/efficacy, importance of medication compliance discussed.  Also  discussed alcohol misuse, and cannabis use and mental health.  He voices understanding with information being given to him today and is agreeable to recommendations.    Associated Signs/Symptoms: Depression Symptoms:  depressed mood, difficulty concentrating, anxiety, (Hypo) Manic Symptoms:  Irritable Mood, Anxiety Symptoms:  Excessive Worry, Psychotic Symptoms:   Denies at this time PTSD Symptoms: NA  Past Psychiatric History: substance-induced mood disorder, anxiety,  schizoaffective disorder, depression, schizophrenia, PTSD, insomnia, polysubstance use, alcohol dependence, and suicidal ideation  Previous Psychotropic Medications: Yes   Substance Abuse History in the last 12 months:  Yes.    Consequences of Substance Abuse: Denies  Past Medical History:  Past Medical History:  Diagnosis Date   Anxiety    Depression    Schizophrenia (HCC)    Substance abuse (HCC)     Past Surgical History:  Procedure Laterality Date   OTHER SURGICAL HISTORY     Ear Surgery     Family Psychiatric History: Father bipolar disorder  Family History:  Family History  Problem Relation Age of Onset   Bipolar disorder Father     Social History:   Social History   Socioeconomic History   Marital status: Single    Spouse name: Not on file   Number of children: 1   Years of education: 9 th grade, on to get GED   Highest education level: GED or equivalent  Occupational History   Not on file  Tobacco Use   Smoking status: Every Day    Current packs/day: 1.00    Average packs/day: 1 pack/day for 10.2 years (10.2 ttl pk-yrs)    Types: Cigarettes    Start date: 09/01/2013   Smokeless tobacco: Never  Vaping Use   Vaping status: Every Day  Substance and Sexual Activity   Alcohol use: Not Currently    Comment: Reports hasn't drank any alcohol  in last 3 months   Drug use: Yes    Types: Marijuana    Comment: Has a history of methamphetamine and cocaine use, current only marijuana   Sexual activity: Not Currently  Other Topics Concern   Not on file  Social History Narrative   Not on file   Social Drivers of Health   Financial Resource Strain: High Risk (12/02/2023)   Overall Financial Resource Strain (CARDIA)    Difficulty of Paying Living Expenses: Hard  Food Insecurity: Food Insecurity Present (12/02/2023)   Hunger Vital Sign    Worried About Running Out of Food in the Last Year: Sometimes true    Ran Out of Food in the Last Year: Sometimes true   Transportation Needs: Unmet Transportation Needs (12/02/2023)   PRAPARE - Administrator, Civil Service (Medical): Yes    Lack of Transportation (Non-Medical): Yes  Physical Activity: Not on file  Stress: Not on file  Social Connections: Unknown (01/13/2022)   [facility] from [facility]   Social Network    Social Network: Not on file     Allergies:   Allergies  Allergen Reactions   Amoxicillin Anaphylaxis    Has patient had a PCN reaction causing immediate rash, facial/tongue/throat swelling, SOB or lightheadedness with hypotension: Yes Has patient had a PCN reaction causing severe rash involving mucus membranes or skin necrosis: No Has patient had a PCN reaction that required hospitalization No Has patient had a PCN reaction occurring within the last 10 years: Yes If all of the above answers are "NO", then may proceed with Cephalosporin use.   Tylenol [Acetaminophen]  Itching    Knees itched   Other Rash    Allergic reaction to coppertone suntan lotion    Metabolic Disorder Labs:  Labs ordered Lab Results  Component Value Date   HGBA1C 5.3 08/23/2022   MPG 99.67 12/08/2020   No results found for: "PROLACTIN" Lab Results  Component Value Date   CHOL 134 08/23/2022   TRIG 103 08/23/2022   HDL 39 (L) 08/23/2022   CHOLHDL 3.4 08/23/2022   VLDL 19 12/08/2020   LDLCALC 76 08/23/2022   LDLCALC 66 12/08/2020   Lab Results  Component Value Date   TSH 1.449 12/08/2020      Current Medications: Current Outpatient Medications  Medication Sig Dispense Refill   hydrOXYzine (ATARAX) 25 MG tablet Take 1 tablet (25 mg total) by mouth 3 (three) times daily as needed for anxiety. 270 tablet 1   Paliperidone Palmitate ER (INVEGA TRINZA) 546 MG/1.75ML injection Inject 1.75 mLs (546 mg total) into the muscle every 3 (three) months. Please bring medication with you for next scheduled visit 03/02/2024 1.8 mL 1   traZODone (DESYREL) 50 MG tablet Take 1 tablet  (50 mg total) by mouth at bedtime as needed. for sleep 90 tablet 1   No current facility-administered medications for this visit.    Musculoskeletal: Strength & Muscle Tone: within normal limits Gait & Station: normal Patient leans: N/A  Psychiatric Specialty Exam: Review of Systems  Constitutional:        No other complaints voiced  Psychiatric/Behavioral:  Positive for dysphoric mood. Negative for agitation, hallucinations, self-injury, sleep disturbance and suicidal ideas. The patient is nervous/anxious.   All other systems reviewed and are negative.   Blood pressure 109/71, pulse 74, height 5' 10.25" (1.784 m), weight 239 lb 6.4 oz (108.6 kg), SpO2 97%.Body mass index is 34.11 kg/m.  General Appearance: Casual  Eye Contact:  Good  Speech:  Clear and Coherent and Normal Rate  Volume:  Normal  Mood:  Dysphoric  Affect:  Congruent  Thought Process:  Coherent, Goal Directed, and Descriptions of Associations: Intact  Orientation:  Full (Time, Place, and Person)  Thought Content:  WDL and Logical  Suicidal Thoughts:  No  Homicidal Thoughts:  No  Memory:  Immediate;   Good Recent;   Good Remote;   Good  Judgement:  Intact  Insight:  Present  Psychomotor Activity:  Normal  Concentration:  Concentration: Good and Attention Span: Good  Recall:  Good  Fund of Knowledge:Good  Language: Good  Akathisia:  No  Handed:  Right  AIMS (if indicated):  done  Assets:  Communication Skills Desire for Improvement Housing Leisure Time Physical Health Resilience Social Support  ADL's:  Intact  Cognition: WNL  Sleep:  Good   Screenings: AIMS    Flowsheet Row Office Visit from 12/02/2023 in [facility] [place] Clinical Support from 02/27/2023 in [facility] Office Visit from 02/22/2022 in [facility] Office Visit from 11/23/2021 in [facility] Admission (Discharged)  [facility]  AIMS Total Score 0 0 0 0 0      AUDIT    Flowsheet Row Office Visit from 12/02/2023 in [facility] [place] Admission (Discharged) from [facility]  Alcohol Use Disorder Identification Test Final Score (AUDIT) 3 3      GAD-7    Flowsheet Row Office Visit from 12/02/2023 in  [facility] [place] Most recent reading at 12/02/2023 10:38 AM Clinical Support from 12/02/2023 in [facility] [place] Most recent reading at 12/02/2023 10:07 AM Office Visit from 07/25/2023 in [facility] Most recent reading at 07/25/2023 11:03 AM Clinical Support from 02/27/2023 in [facility] Most recent reading at 02/27/2023  3:04 PM Office Visit from 11/21/2022 in [facility] Most recent reading at 11/21/2022  3:27 PM  Total GAD-7 Score 13 2 21  0 3      PHQ2-9    Flowsheet Row Office Visit from 12/02/2023 in [facility] [place] Most recent reading at 12/02/2023 10:38 AM Clinical Support from 12/02/2023 in [facility] [place] Most recent reading at 12/02/2023 10:07 AM Office Visit from 07/25/2023 in [facility] Most recent reading at 07/25/2023 11:04 AM Clinical Support from 02/27/2023 in [facility] Most recent reading at 02/27/2023  3:04 PM Office Visit from 11/21/2022 in [name] Most recent reading at 11/21/2022  3:26 PM  PHQ-2 Total Score 6 0 3 0 0  PHQ-9 Total Score 18 0 14 0 0      Flowsheet Row ED from 05/30/2023 in [facility] at [facility] ED from 01/15/2023 in [facility] at [facility] Office Visit from  02/22/2022 in [facility]  C-SSRS RISK CATEGORY No Risk No Risk No Risk       Assessment and Plan: Assessment: Patient seen and examined as noted above. Summary: Today Brynn D Lacross appears to be doing well.  He reports medications are effective and managing his mental health without any adverse reaction.  How ever he is reporting issues with transportation, and running out of food stamps early.  He reports he is eating and sleeping without difficulty.  He denies suicidal/self-harm/homicidal ideation, psychosis, paranoia, and abnormal movements.      During visit he is dressed appropriate for age and weather.  He is sitting upright in chair with no noted distress.  He is alert/oriented x 4, calm/cooperative and mood is congruent with affect.  He spoke in a clear tone at moderate volume, and normal pace, with good eye contact.  His thought process is coherent, relevant, and there is no indication that he is currently responding to internal/external stimuli or experiencing delusional thought content.    1. Generalized anxiety disorder Worries about transportation to places he needs to go, irritability  Continue- hydrOXYzine (ATARAX) 25 MG tablet; Take 1 tablet (25 mg total) by mouth 3 (three) times daily as needed for anxiety.  Dispense: 270 tablet; Refill: 1  2. Insomnia, unspecified type Sleeping without difficulty with medication Continue- traZODone (DESYREL) 50 MG tablet; Take 1 tablet (50 mg total) by mouth at bedtime as needed. for sleep  Dispense: 90 tablet; Refill: 1  3. Paranoid schizophrenia (HCC) (Primary) Denies symptoms at this time.   Continue- Paliperidone Palmitate ER (INVEGA TRINZA) 546 MG/1.75ML injection; Inject 1.75 mLs (546 mg total) into the muscle every 3 (three) months. Please bring medication with you for next scheduled visit 03/02/2024  Dispense: 1.8 mL; Refill: 1 Return to office tomorrow 12/03/23 to receive injection - CBC with  Differential - TSH - Comprehensive metabolic panel with GFR - HgB Z6X - Lipid panel - Prolactin   Plan: Medications: Meds ordered this encounter  Medications  hydrOXYzine (ATARAX) 25 MG tablet    Sig: Take 1 tablet (25 mg total) by mouth 3 (three) times daily as needed for anxiety.    Dispense:  270 tablet    Refill:  1    Supervising Provider:   Kathryne Sharper T [[id number]]   traZODone (DESYREL) 50 MG tablet    Sig: Take 1 tablet (50 mg total) by mouth at bedtime as needed. for sleep    Dispense:  90 tablet    Refill:  1    Supervising Provider:   Kathryne Sharper T [[id number]]   Paliperidone Palmitate ER (INVEGA TRINZA) 546 MG/1.75ML injection    Sig: Inject 1.75 mLs (546 mg total) into the muscle every 3 (three) months. Please bring medication with you for next scheduled visit 03/02/2024    Dispense:  1.8 mL    Refill:  1    Supervising Provider:   Kathryne Sharper T [[id number]]    Lab Orders         CBC with Differential         TSH         Comprehensive metabolic panel with GFR         HgB A1c         Lipid panel         Prolactin     Other:  Follow up on community resources and [facility]. Dept. Social Services.     Hisham Provence Sky is instructed to call 911, [phone], mobile crisis, or present to the nearest emergency room should he experience any suicidal/homicidal ideation, auditory/visual/hallucinations, or detrimental worsening of his mental health condition.   Joeanthony D Manka advised to reduce cigarette/marijuana/alcohol use and to consider quitting Davinder D Strycharz has participated in the development of this treatment plan and verbalized his agreement with plan as listed.  Follow Up: Return tomorrow for Eyvonne Mechanic injection since did not have it today; Then return in 3 months for psychiatric evaluation/medication management and Invega Trinza injection  Call in the interim for any side-effects, decompensation, questions, or problems  Collaboration of Care: Medication Management  [facility] medication refills and [facility] to see if Eyvonne Mechanic available, community resources  Patient/Guardian was advised Release of Information must be obtained prior to any record release in order to collaborate their care with an outside provider. Patient/Guardian was advised if they have not already done so to contact the registration department to sign all necessary forms in order for [place] to release information regarding their care.   Consent: Patient/Guardian gives verbal consent for treatment and assignment of benefits for services provided during this visit. Patient/Guardian expressed understanding and agreed to proceed.   Roselinda Bahena, NP 3/31/202512:29 PM

## 2023-12-02 NOTE — Patient Instructions (Addendum)
 You can return to office 12/06/2023 to get your Invega injection.  You will need to come at times listed below: After 8:40 am Before 11:45 am or Anytime after 1:00 PM  Please bring your Invega to scheduled injection appointments.                         Call 911, [phone], mobile crisis, or present to the nearest emergency room should you experience any suicidal/homicidal ideation, auditory/visual/hallucinations, or detrimental worsening of your mental health.  Mobile Crisis Response Teams Listed by counties in vicinity of [facility] providers [facility] [phone] [facility] [phone] [facility] [phone] [facility] [phone] [place]                * [facility] [phone]                * [employer] [phone]  [facility] [employer] [phone] [facility] [employer]  [phone] * [employer] [phone]

## 2023-12-02 NOTE — Telephone Encounter (Signed)
 Medication management - Call [facility] [place] [place] to question when patient's Trinza medication would be in. Collateral reported this should be arriving at their store later today and patient should be able to pick it up for administration on tomorrow.  Message sent to [facility], NP so they could reschedule patient for later this week for the injection once [facility] and returns.

## 2023-12-03 ENCOUNTER — Ambulatory Visit (INDEPENDENT_AMBULATORY_CARE_PROVIDER_SITE_OTHER): Admitting: *Deleted

## 2023-12-03 ENCOUNTER — Other Ambulatory Visit (HOSPITAL_COMMUNITY): Payer: Self-pay | Admitting: *Deleted

## 2023-12-03 VITALS — BP 116/78 | HR 83 | Ht 70.25 in | Wt 234.4 lb

## 2023-12-03 DIAGNOSIS — F200 Paranoid schizophrenia: Secondary | ICD-10-CM | POA: Diagnosis not present

## 2023-12-03 MED ORDER — PALIPERIDONE PALMITATE ER 546 MG/1.75ML IM SUSY
546.0000 mg | PREFILLED_SYRINGE | Freq: Once | INTRAMUSCULAR | Status: AC
  Administered 2023-12-03: 546 mg via INTRAMUSCULAR

## 2023-12-03 NOTE — Patient Instructions (Signed)
 Follow up in 3 months from today

## 2023-12-03 NOTE — Progress Notes (Cosign Needed)
 Patient came into office with no complaints. No SI/HI was expressed. Paliperidone Palmitate ER [name] Trinza) injection was given in the Right Upper Outer Quadrant. Patient sat for observation for 15 mins before leaving and patient did not express any complaints.    NDC number is [id number] LOT number is [id number] Expiration is April 2026

## 2024-02-17 ENCOUNTER — Telehealth (HOSPITAL_COMMUNITY): Payer: Self-pay | Admitting: *Deleted

## 2024-02-17 NOTE — Telephone Encounter (Signed)
 Patient called stating he would like a letter for his food stamps to stay on. Per pt the doctor that he had before this provider informed him that he could only work 25 hours a week. Per pt but that was before and now he's on different medications and it's saying that he can not work outside in the heat and he's still working outside in the heat. Per pt this letter is due before the 30th of June because his food stamps gets turned off on the 30th. Per patient chart, he was last seen 12/02/2023 by provider in this office. Patient next appt is not until the 30th of this month Per pt this provider was his provider when she was in [facility]. Patient would like for provider to please give him a call to discuss what should go in the letter [phone].

## 2024-02-20 NOTE — Telephone Encounter (Signed)
 Spoke with patient and he stated he did not want to schedule sooner appt and will just leave his appointment for the 30th and speak with provider about this although the letter is due on the 30th of June.

## 2024-02-20 NOTE — Telephone Encounter (Signed)
 LM

## 2024-03-02 ENCOUNTER — Ambulatory Visit (INDEPENDENT_AMBULATORY_CARE_PROVIDER_SITE_OTHER): Admitting: Registered Nurse

## 2024-03-02 ENCOUNTER — Encounter (HOSPITAL_COMMUNITY): Payer: Self-pay | Admitting: Registered Nurse

## 2024-03-02 ENCOUNTER — Telehealth (HOSPITAL_COMMUNITY): Payer: Self-pay | Admitting: *Deleted

## 2024-03-02 ENCOUNTER — Ambulatory Visit (HOSPITAL_COMMUNITY): Admitting: Registered Nurse

## 2024-03-02 DIAGNOSIS — G4700 Insomnia, unspecified: Secondary | ICD-10-CM | POA: Diagnosis not present

## 2024-03-02 DIAGNOSIS — F200 Paranoid schizophrenia: Secondary | ICD-10-CM | POA: Diagnosis not present

## 2024-03-02 DIAGNOSIS — F411 Generalized anxiety disorder: Secondary | ICD-10-CM | POA: Diagnosis not present

## 2024-03-02 MED ORDER — PALIPERIDONE PALMITATE ER 546 MG/1.75ML IM SUSY
546.0000 mg | PREFILLED_SYRINGE | Freq: Once | INTRAMUSCULAR | Status: AC
  Administered 2024-06-08: 546 mg via INTRAMUSCULAR

## 2024-03-02 MED ORDER — HYDROXYZINE HCL 25 MG PO TABS: 25.0000 mg | ORAL_TABLET | Freq: Three times a day (TID) | ORAL | 1 refills | Status: DC | PRN

## 2024-03-02 MED ORDER — INVEGA TRINZA 546 MG/1.75ML IM SUSY: 546.0000 mg | PREFILLED_SYRINGE | INTRAMUSCULAR | 1 refills | Status: DC

## 2024-03-02 MED ORDER — TRAZODONE HCL 50 MG PO TABS: 50.0000 mg | ORAL_TABLET | Freq: Every evening | ORAL | 1 refills | Status: DC | PRN

## 2024-03-02 NOTE — Patient Instructions (Signed)

## 2024-03-02 NOTE — Progress Notes (Signed)
 Patient arrived for Paliperidone  Palmitate ER (Invega  Trinza) injection was given in the LEFT Upper Outer Quadrant.  Patient was very pleasant & remember me from the [facility]. He was openly honest about how he felt about begin questioned about begin suicidal  & said I don't like these questions & I feel uncomfortable. I thanked him for his honesty  & moved on. PATIENT BROUGHT IN OWN MEDICATION

## 2024-03-02 NOTE — Progress Notes (Signed)
 [facility] MD/PA/NP OP Progress Note  03/02/2024 11:15 AM [name]  MRN:  [id number]  Chief Complaint:  Chief Complaint  Patient presents with   Follow-up    Medication management   HPI: Marc Hart 33 y.o. male presents to office today for medication management follow up.  He is seen face to face by this provider, and chart reviewed on 03/02/24.  His psychiatric history is significant for  substance-induced mood disorder, anxiety, schizoaffective disorder, depression, schizophrenia, PTSD, insomnia, polysubstance use, alcohol dependence, and suicidal ideation.  His mental health is currently managed with Vistaril  25 mg 3 times daily as needed, trazodone  50 mg nightly as needed, and Invega  Trinza 546 mg every 3 months.  He reports he does not know if his current medication regimen is managing his mental health effectively I do not even know how I felt before I started taking the medication.  I just want to be my own person, and I should feel comfortable and feel that there is good communication between me and the doctor.  But, I feel like I am on this medicine because somebody feels like it is what I need.  Everybody wants their way, they are right, and nobody listens to me.  Initially when patient presented he was agitated related to having to fill out PHQ-9 and then asked if he was having any suicidal thoughts.  He felt like questions were repetitive.  He eventually calmed down when he started talking about his visits but this time and plans that he had made. He denies suicidal/self-harm/homicidal ideation, psychosis, paranoia, and abnormal movements. PHQ 2/9, C-SSRS, GAD 7, AIMS, nutrition, and pain screenings conducted during today's visit, see scores below. He reports eating and sleeping without difficulty.  Recommended the following: Continue current medication regimen without any changes (Vistaril  25 mg 3 times daily as needed, trazodone  50 mg nightly as needed, and Invega  Trinza 546 mg  every 3 months).  He voices understanding/agreement with information/recommendations being given to him today.    Visit Diagnosis:    ICD-10-CM   1. Paranoid schizophrenia (HCC)  F20.0 Paliperidone  Palmitate ER (INVEGA  TRINZA) 546 MG/1.75ML injection    2. Insomnia, unspecified type  G47.00 traZODone  (DESYREL ) 50 MG tablet    3. Generalized anxiety disorder  F41.1 hydrOXYzine  (ATARAX ) 25 MG tablet      Past Psychiatric History: substance-induced mood disorder, anxiety, schizoaffective disorder, depression, schizophrenia, PTSD, insomnia, polysubstance use, alcohol dependence, and suicidal ideation   Past Medical History:  Past Medical History:  Diagnosis Date   Anxiety    Depression    Schizophrenia (HCC)    Substance abuse (HCC)     Past Surgical History:  Procedure Laterality Date   OTHER SURGICAL HISTORY     Ear Surgery     Family Psychiatric History: See below and family history  Family History:  Family History  Problem Relation Age of Onset   Bipolar disorder Father     Social History:  Social History   Socioeconomic History   Marital status: Single    Spouse name: Not on file   Number of children: 1   Years of education: 9 th grade, on to get GED   Highest education level: GED or equivalent  Occupational History   Not on file  Tobacco Use   Smoking status: Every Day    Current packs/day: 1.00    Average packs/day: 1 pack/day for 10.5 years (10.5 ttl pk-yrs)    Types: Cigarettes    Start date:  09/01/2013   Smokeless tobacco: Never  Vaping Use   Vaping status: Every Day  Substance and Sexual Activity   Alcohol use: Not Currently    Comment: Reports hasn't drank any alcohol  in last 3 months   Drug use: Yes    Types: Marijuana    Comment: Has a history of methamphetamine and cocaine use, current only marijuana   Sexual activity: Not Currently  Other Topics Concern   Not on file  Social History Narrative   Not on file   Social Drivers of Health    Financial Resource Strain: High Risk (12/02/2023)   Overall Financial Resource Strain (CARDIA)    Difficulty of Paying Living Expenses: Hard  Food Insecurity: Food Insecurity Present (12/02/2023)   Hunger Vital Sign    Worried About Running Out of Food in the Last Year: Sometimes true    Ran Out of Food in the Last Year: Sometimes true  Transportation Needs: Unmet Transportation Needs (12/02/2023)   PRAPARE - Administrator, Civil Service (Medical): Yes    Lack of Transportation (Non-Medical): Yes  Physical Activity: Not on file  Stress: Not on file  Social Connections: Unknown (01/13/2022)   Received from [facility]   Social Network    Social Network: Not on file    Allergies:  Allergies  Allergen Reactions   Amoxicillin  Anaphylaxis    Has patient had a PCN reaction causing immediate rash, facial/tongue/throat swelling, SOB or lightheadedness with hypotension: Yes Has patient had a PCN reaction causing severe rash involving mucus membranes or skin necrosis: No Has patient had a PCN reaction that required hospitalization No Has patient had a PCN reaction occurring within the last 10 years: Yes If all of the above answers are NO, then may proceed with Cephalosporin use.   Tylenol  [Acetaminophen ] Itching    Knees itched   Other Rash    Allergic reaction to coppertone suntan lotion    Metabolic Disorder Labs: Did not have blood work done that was ordered during prior visit.  Most recent labs were reviewed Lab Results  Component Value Date   HGBA1C 5.3 08/23/2022   MPG 99.67 12/08/2020   No results found for: PROLACTIN Lab Results  Component Value Date   CHOL 134 08/23/2022   TRIG 103 08/23/2022   HDL 39 (L) 08/23/2022   CHOLHDL 3.4 08/23/2022   VLDL 19 12/08/2020   LDLCALC 76 08/23/2022   LDLCALC 66 12/08/2020   Lab Results  Component Value Date   TSH 1.449 12/08/2020   Current Medications: Current Outpatient Medications  Medication Sig Dispense  Refill   hydrOXYzine  (ATARAX ) 25 MG tablet Take 1 tablet (25 mg total) by mouth 3 (three) times daily as needed for anxiety. 270 tablet 1   Paliperidone  Palmitate ER (INVEGA  TRINZA) 546 MG/1.75ML injection Inject 1.75 mLs (546 mg total) into the muscle every 3 (three) months. Please bring medication with you for next scheduled visit 03/02/2024 1.8 mL 1   traZODone  (DESYREL ) 50 MG tablet Take 1 tablet (50 mg total) by mouth at bedtime as needed. for sleep 90 tablet 1   Current Facility-Administered Medications  Medication Dose Route Frequency Provider Last Rate Last Admin   Paliperidone  Palmitate ER (INVEGA  TRINZA) injection 546 mg  546 mg Intramuscular Once          Musculoskeletal: Strength & Muscle Tone: within normal limits Gait & Station: normal Patient leans: N/A  Psychiatric Specialty Exam: Review of Systems  Constitutional:  No other complaints voiced at this time  Psychiatric/Behavioral:  Negative for hallucinations, self-injury, sleep disturbance and suicidal ideas. The patient is not nervous/anxious.   All other systems reviewed and are negative.   Blood pressure 129/84, pulse 76, height 5' 10.25 (1.784 m), weight 242 lb (109.8 kg).Body mass index is 34.48 kg/m.  General Appearance: Casual  Eye Contact:  Good  Speech:  Clear and Coherent and Normal Rate  Volume:  Normal  Mood:  Irritable  Affect:  Congruent  Thought Process:  Coherent, Goal Directed, and Descriptions of Associations: Intact  Orientation:  Full (Time, Place, and Person)  Thought Content: Logical   Suicidal Thoughts:  No  Homicidal Thoughts:  No  Memory:  Immediate;   Good Recent;   Good Remote;   Good  Judgement:  Intact  Insight:  Present  Psychomotor Activity:  Normal  Concentration:  Concentration: Good and Attention Span: Good  Recall:  Good  Fund of Knowledge: Good  Language: Good  Akathisia:  No  Handed:  Right  AIMS (if indicated): done  Assets:  Communication Skills Desire for  Improvement Financial Resources/Insurance Housing Physical Health Social Support  ADL's:  Intact  Cognition: WNL  Sleep:  Good   Screenings: AIMS    Flowsheet Row Office Visit from 03/02/2024 in [facility] [place] Office Visit from 12/02/2023 in [facility] [place] Clinical Support from 02/27/2023 in [facility] Office Visit from 02/22/2022 in [facility] Office Visit from 11/23/2021 in [facility]  AIMS Total Score 0 0 0 0 0   AUDIT    Flowsheet Row Office Visit from 12/02/2023 in [facility] [place] Admission (Discharged[facility]  Alcohol Use Disorder Identification Test Final Score (AUDIT) 3 3   GAD-7    Flowsheet Row Office Visit from 03/02/2024 in [facility] [place] Most recent reading at 03/02/2024  9:28 AM Office Visit from 12/02/2023 in [facility] at [place] Most recent reading at 12/02/2023 10:38 AM Clinical Support from 12/02/2023 in [facility] at [place] Most recent reading at 12/02/2023 10:07 AM Office Visit from 07/25/2023 in [facility] Most recent reading at 07/25/2023 11:03 AM Clinical Support from 02/27/2023 in [facility] Most recent reading at 02/27/2023  3:04 PM  Total GAD-7 Score 3 13 2 21  0   PHQ2-9    Flowsheet Row Office Visit from 03/02/2024 in [facility] at [place] Most recent reading at 03/02/2024  9:28 AM Office Visit from 12/02/2023 in [facility] at [place] Most recent reading at 12/02/2023 10:38 AM Clinical Support from 12/02/2023 in [facility] at [place] Most recent  reading at 12/02/2023 10:07 AM Office Visit from 07/25/2023 in [facility] Most recent reading at 07/25/2023 11:04 AM Clinical Support from 02/27/2023 in [facility] Most recent reading at 02/27/2023  3:04 PM  PHQ-2 Total Score 0 6 0 3 0  PHQ-9 Total Score 4 18 0 14 0   Flowsheet Row Office Visit from 03/02/2024 in [facility] at [place] ED from 05/30/2023 in [facility] at [facility] ED from 01/15/2023 in [facility] Department at [facility]  C-SSRS RISK CATEGORY Error: Question 6  not populated No Risk No Risk     Assessment and Plan:  Assessment: Patient seen and examined as noted above. Summary: Today Marc Hart appears to be doing well.  However, he reports he does not know if current medications is effectively managing his mental health because he does not remember how he felt what was happening prior to starting his psychotropic medications.  He does deny suicidal/self-harm/homicidal ideation, psychosis, paranoia, and abnormal movements.  He reports he is eating and sleeping without any difficulty. During visit he is dressed appropriate for age and weather.  He is seated comfortably in chair with no noted distress.  He is alert/oriented x 4, calm/cooperative and mood is congruent with affect.  He spoke in a clear tone at moderate volume, and normal pace, with good eye contact.  His thought process is coherent, relevant, and there is no indication that he is currently responding to internal/external stimuli or experiencing delusional thought content.    1. Paranoid schizophrenia (HCC) - Paliperidone  Palmitate ER (INVEGA  TRINZA) 546 MG/1.75ML injection; Inject 1.75 mLs (546 mg total) into the muscle every 3 (three) months. Please bring medication with you for next scheduled visit 03/02/2024  Dispense: 1.8 mL; Refill: 1  2. Insomnia, unspecified type -  traZODone  (DESYREL ) 50 MG tablet; Take 1 tablet (50 mg total) by mouth at bedtime as needed. for sleep  Dispense: 90 tablet; Refill: 1  3. Generalized anxiety disorder - hydrOXYzine  (ATARAX ) 25 MG tablet; Take 1 tablet (25 mg total) by mouth 3 (three) times daily as needed for anxiety.  Dispense: 270 tablet; Refill: 1   Plan: Medications: Meds ordered this encounter  Medications   Paliperidone  Palmitate ER (INVEGA  TRINZA) 546 MG/1.75ML injection    Sig: Inject 1.75 mLs (546 mg total) into the muscle every 3 (three) months. Please bring medication with you for next scheduled visit 03/02/2024    Dispense:  1.8 mL    Refill:  1    Supervising Provider:   CURRY PATERSON T [[id number]]   traZODone  (DESYREL ) 50 MG tablet    Sig: Take 1 tablet (50 mg total) by mouth at bedtime as needed. for sleep    Dispense:  90 tablet    Refill:  1    Supervising Provider:   CURRY, SYED T [[id number]]   hydrOXYzine  (ATARAX ) 25 MG tablet    Sig: Take 1 tablet (25 mg total) by mouth 3 (three) times daily as needed for anxiety.    Dispense:  270 tablet    Refill:  1    Supervising Provider:   ARFEEN, SYED T [[id number]]   Paliperidone  Palmitate ER (INVEGA  TRINZA) injection 546 mg    Labs: Previous orders for labs not done, encouraged to have blood work done.  Other:  Declined counseling/therapy at this time Artur Winningham Reffner is instructed to call 911, [phone], mobile crisis, or present to the nearest emergency room should he experience any suicidal/homicidal ideation, auditory/visual/hallucinations, or detrimental worsening of his mental health condition.   Amani D Ramires participated in the development of this treatment plan and verbalized his understanding/agreement with plan as listed.  Follow Up: Return in 3 months for medication management Call in the interim for any side-effects, decompensation, questions, or problems  Collaboration of Care: Collaboration of Care: Medication Management AEB medication assessment and  refills  Patient/Guardian was advised Release of Information must be obtained prior to any record release in order to collaborate their care with an outside provider. Patient/Guardian was advised if they have  not already done so to contact the registration department to sign all necessary forms in order for us  to release information regarding their care.   Consent: Patient/Guardian gives verbal consent for treatment and assignment of benefits for services provided during this visit. Patient/Guardian expressed understanding and agreed to proceed.    Kimila Papaleo, NP 03/02/2024, 11:15 AM

## 2024-03-02 NOTE — Patient Instructions (Addendum)
 Patient arrived for Paliperidone  Palmitate ER (Invega  Trinza) injection was given in the LEFT Upper Outer Quadrant.  Patient was very pleasant & remember me from the [facility]. He was openly honest about how he felt about begin questioned about begin suicidal  & said I don't like these questions & I feel uncomfortable. I thanked him for his honesty  & moved on. PATIENT BROUGHT IN OWN MEDICATION

## 2024-03-02 NOTE — Telephone Encounter (Signed)
 INVEGA  Trinza 546 MH/1.75 ML NDC: 49541-391-98 Lot: [id number] EXP: 2026-JUL

## 2024-06-01 ENCOUNTER — Ambulatory Visit (HOSPITAL_COMMUNITY): Admitting: Registered Nurse

## 2024-06-08 ENCOUNTER — Ambulatory Visit (INDEPENDENT_AMBULATORY_CARE_PROVIDER_SITE_OTHER): Payer: MEDICAID | Admitting: Registered Nurse

## 2024-06-08 ENCOUNTER — Ambulatory Visit (HOSPITAL_COMMUNITY): Admitting: Registered Nurse

## 2024-06-08 ENCOUNTER — Other Ambulatory Visit (HOSPITAL_COMMUNITY): Payer: Self-pay | Admitting: *Deleted

## 2024-06-08 VITALS — BP 115/77 | HR 74 | Ht 70.25 in | Wt 239.6 lb

## 2024-06-08 DIAGNOSIS — F200 Paranoid schizophrenia: Secondary | ICD-10-CM

## 2024-06-08 NOTE — Progress Notes (Signed)
 Opened in Error.

## 2024-06-08 NOTE — Progress Notes (Signed)
 Patient last injection was given April 2025. Patient missed his July 2025 injection. Patient came in today 06/08/2024 to get his Invega  Trinza 546mg /1.18mL injection. Injection was given in patient Left Deltoid. Patient did not have any c/o. Staff asked patient what happened in July for him to miss his appt for his injection and he stated that he don't know but he was able to stop drinking for a little bit and when he does drink while on the injection, he feels like per pt crap.    NDC number: 49541-391-98 LOT number: [id number] EXPIRATION number: 09-02-2025

## 2024-06-08 NOTE — Patient Instructions (Addendum)
 Follow up in 3 months

## 2024-06-15 ENCOUNTER — Ambulatory Visit (INDEPENDENT_AMBULATORY_CARE_PROVIDER_SITE_OTHER): Admitting: Registered Nurse

## 2024-06-15 ENCOUNTER — Encounter (HOSPITAL_COMMUNITY): Payer: Self-pay | Admitting: Registered Nurse

## 2024-06-15 VITALS — BP 127/76 | HR 70 | Ht 70.25 in | Wt 237.4 lb

## 2024-06-15 DIAGNOSIS — F200 Paranoid schizophrenia: Secondary | ICD-10-CM | POA: Diagnosis not present

## 2024-06-15 DIAGNOSIS — G4700 Insomnia, unspecified: Secondary | ICD-10-CM | POA: Diagnosis not present

## 2024-06-15 DIAGNOSIS — F411 Generalized anxiety disorder: Secondary | ICD-10-CM | POA: Diagnosis not present

## 2024-06-15 MED ORDER — TRAZODONE HCL 50 MG PO TABS: 50.0000 mg | ORAL_TABLET | Freq: Every evening | ORAL | 1 refills | Status: DC | PRN

## 2024-06-15 MED ORDER — HYDROXYZINE HCL 25 MG PO TABS: 25.0000 mg | ORAL_TABLET | Freq: Three times a day (TID) | ORAL | 1 refills | Status: DC | PRN

## 2024-06-15 MED ORDER — PALIPERIDONE PALMITATE ER 819 MG/2.63ML IM SUSY
819.0000 mg | PREFILLED_SYRINGE | INTRAMUSCULAR | Status: AC
  Administered 2024-08-17: 12:00:00 819 mg via INTRAMUSCULAR

## 2024-06-15 MED ORDER — PALIPERIDONE PALMITATE ER 819 MG/2.63ML IM SUSY: 819.0000 mg | PREFILLED_SYRINGE | INTRAMUSCULAR | Status: AC

## 2024-06-15 NOTE — Progress Notes (Signed)
 [facility] MD/PA/NP OP Progress Note  06/15/2024 12:11 PM Marc Hart  MRN:  [id number]  Chief Complaint:  No chief complaint on file.  HPI: Marc Hart 33 y.o. male presents to office today for medication management follow up.  He is seen face to face by this provider, and chart reviewed on 06/15/24.  His psychiatric history is significant for  substance-induced mood disorder, anxiety, schizoaffective disorder, depression, schizophrenia, PTSD, insomnia, polysubstance use, alcohol dependence, and suicidal ideation.  His mental health is currently managed with Vistaril  25 mg 3 times daily as needed, trazodone  50 mg nightly as needed, and Invega  Trinza 546 mg every 3 months.  He reports Invega  Marc Hart is running out a month early at which time he starts to have some paranoia and racing thoughts.  He reports the trazodone  helps with sleep but sometimes it takes a while to work.  He denies adverse reaction to medications.  He also denies suicidal/self-harm/homicidal ideation, psychosis, paranoia, and abnormal movement.  He reports he feels much better since he was given his injection last week.  He reports he is eating without difficulty.  Screenings completed during today's visit C-SSRS, AIMS, Nutrition, and Pain, see scores below.  Treatment discussed: Increasing Invega  Trinza to 819 mg every 90 days related to him not being able to get in for a monthly injection also discussed possibly changing to Abilify  65-month injection, or adding Invega  by mouth to make up the difference.  Address changing the Seroquel  to where he may repeat the dose x 1 if needed  Recommendations: Continue Vistaril  25 mg 3 times daily as needed, Trazodone  changed 50 mg daily at bedtime as needed, may repeat dose once as needed, increased Invega  Trinza 819 mg every 90 days. He voices understanding/agreement with information/recommendations being given to him today.    Visit Diagnosis:    ICD-10-CM   1. Paranoid schizophrenia  (HCC)  F20.0 paliperidone  Palmitate ER (INVEGA  TRINZA) injection 819 mg    paliperidone  Palmitate ER (INVEGA  TRINZA) injection 819 mg    2. Insomnia, unspecified type  G47.00 traZODone  (DESYREL ) 50 MG tablet    3. Generalized anxiety disorder  F41.1 hydrOXYzine  (ATARAX ) 25 MG tablet      Past Psychiatric History: substance-induced mood disorder, anxiety, schizoaffective disorder, depression, schizophrenia, PTSD, insomnia, polysubstance use, alcohol dependence, and suicidal ideation   Past Medical History:  Past Medical History:  Diagnosis Date   Anxiety    Depression    Schizophrenia (HCC)    Substance abuse (HCC)     Past Surgical History:  Procedure Laterality Date   OTHER SURGICAL HISTORY     Ear Surgery     Family Psychiatric History: See below and family history  Family History:  Family History  Problem Relation Age of Onset   Bipolar disorder Father     Social History:  Social History   Socioeconomic History   Marital status: Single    Spouse name: Not on file   Number of children: 1   Years of education: 9 th grade, on to get GED   Highest education level: GED or equivalent  Occupational History   Not on file  Tobacco Use   Smoking status: Every Day    Current packs/day: 1.00    Average packs/day: 1 pack/day for 10.8 years (10.8 ttl pk-yrs)    Types: Cigarettes    Start date: 09/01/2013   Smokeless tobacco: Never  Vaping Use   Vaping status: Every Day  Substance and Sexual Activity  Alcohol use: Not Currently    Comment: Reports hasn't drank any alcohol  in last 3 months   Drug use: Yes    Types: Marijuana    Comment: Has a history of methamphetamine and cocaine use, current only marijuana   Sexual activity: Not Currently  Other Topics Concern   Not on file  Social History Narrative   Not on file   Social Drivers of Health   Financial Resource Strain: High Risk (12/02/2023)   Overall Financial Resource Strain (CARDIA)    Difficulty of Paying  Living Expenses: Hard  Food Insecurity: Food Insecurity Present (12/02/2023)   Hunger Vital Sign    Worried About Running Out of Food in the Last Year: Sometimes true    Ran Out of Food in the Last Year: Sometimes true  Transportation Needs: Unmet Transportation Needs (12/02/2023)   PRAPARE - Administrator, Civil Service (Medical): Yes    Lack of Transportation (Non-Medical): Yes  Physical Activity: Not on file  Stress: Not on file  Social Connections: Unknown (01/13/2022)   [facility]   Social Network    Social Network: Not on file    Allergies:  Allergies  Allergen Reactions   Amoxicillin  Anaphylaxis    Has patient had a PCN reaction causing immediate rash, facial/tongue/throat swelling, SOB or lightheadedness with hypotension: Yes Has patient had a PCN reaction causing severe rash involving mucus membranes or skin necrosis: No Has patient had a PCN reaction that required hospitalization No Has patient had a PCN reaction occurring within the last 10 years: Yes If all of the above answers are NO, then may proceed with Cephalosporin use.   Tylenol  [Acetaminophen ] Itching    Knees itched   Other Rash    Allergic reaction to coppertone suntan lotion    Metabolic Disorder Labs: Did not have blood work done that was ordered during prior visit.  Most recent labs were reviewed Lab Results  Component Value Date   HGBA1C 5.3 08/23/2022   MPG 99.67 12/08/2020   No results found for: PROLACTIN Lab Results  Component Value Date   CHOL 134 08/23/2022   TRIG 103 08/23/2022   HDL 39 (L) 08/23/2022   CHOLHDL 3.4 08/23/2022   VLDL 19 12/08/2020   LDLCALC 76 08/23/2022   LDLCALC 66 12/08/2020   Lab Results  Component Value Date   TSH 1.449 12/08/2020   Current Medications: Current Outpatient Medications  Medication Sig Dispense Refill   hydrOXYzine  (ATARAX ) 25 MG tablet Take 1 tablet (25 mg total) by mouth 3 (three) times daily as needed for  anxiety. 270 tablet 1   traZODone  (DESYREL ) 50 MG tablet Take 1 tablet (50 mg total) by mouth at bedtime as needed and may repeat dose one time if needed. for sleep 180 tablet 1   Current Facility-Administered Medications  Medication Dose Route Frequency Provider Last Rate Last Admin   paliperidone  Palmitate ER (INVEGA  TRINZA) injection 819 mg  819 mg Intramuscular Q90 days        [START ON 08/10/2024] paliperidone  Palmitate ER (INVEGA  TRINZA) injection 819 mg  819 mg Intramuscular Q90 days          Musculoskeletal: Strength & Muscle Tone: within normal limits Gait & Station: normal Patient leans: N/A  Psychiatric Specialty Exam: Review of Systems  Constitutional:        No other complaints voiced at this time  Psychiatric/Behavioral:  Negative for agitation, hallucinations, self-injury, sleep disturbance and suicidal ideas. The patient is  not nervous/anxious.        Reporting Invega  transit wears off a month early in doing that time racing thoughts, paranoia, and irritability  All other systems reviewed and are negative.   Blood pressure 127/76, pulse 70, height 5' 10.25 (1.784 m), weight 237 lb 6.4 oz (107.7 kg), SpO2 97%.Body mass index is 33.82 kg/m.  General Appearance: Casual  Eye Contact:  Good  Speech:  Clear and Coherent and Normal Rate  Volume:  Normal  Mood:  Good  Better since I got my shot last week.  Affect:  Congruent  Thought Process:  Coherent, Goal Directed, and Descriptions of Associations: Intact  Orientation:  Full (Time, Place, and Person)  Thought Content: Logical   Suicidal Thoughts:  No  Homicidal Thoughts:  No  Memory:  Immediate;   Good Recent;   Good Remote;   Good  Judgement:  Intact  Insight:  Present  Psychomotor Activity:  Normal  Concentration:  Concentration: Good and Attention Span: Good  Recall:  Good  Fund of Knowledge: Good  Language: Good  Akathisia:  No  Handed:  Right  AIMS (if indicated): done  Assets:  Communication  Skills Desire for Improvement Financial Resources/Insurance Housing Physical Health Social Support  ADL's:  Intact  Cognition: WNL  Sleep:  Good   Screenings: AIMS    Flowsheet Row Office Visit from 03/02/2024 in [facility] [place] Office Visit from 12/02/2023 in [facility] [place] Clinical Support from 02/27/2023 in [facility] Office Visit from 02/22/2022 in [facility] Office Visit from 11/23/2021 in [facility]  AIMS Total Score 0 0 0 0 0   AUDIT    Flowsheet Row Office Visit from 12/02/2023 in [facility] [place] Admission (Discharged[facility]  Alcohol Use Disorder Identification Test Final Score (AUDIT) 3 3   GAD-7    Flowsheet Row Office Visit from 03/02/2024 in [facility] [place] Most recent reading at 03/02/2024  9:28 AM Office Visit from 12/02/2023 in [facility] [place] Most recent reading at 12/02/2023 10:38 AM Clinical Support from 12/02/2023 in [facility] at [place] Most recent reading at 12/02/2023 10:07 AM Office Visit from 07/25/2023 in [facility] Most recent reading at 07/25/2023 11:03 AM Clinical Support from 02/27/2023 in [facility] Most recent reading at 02/27/2023  3:04 PM  Total GAD-7 Score 3 13 2 21  0   PHQ2-9    Flowsheet Row Office Visit from 03/02/2024 in [facility] at [place] Most recent reading at 03/02/2024  9:28 AM Office Visit from 12/02/2023 in [facility] at [place] Most recent reading at 12/02/2023 10:38 AM Clinical Support from 12/02/2023 in [facility] at  [place] Most recent reading at 12/02/2023 10:07 AM Office Visit from 07/25/2023 in [facility] Most recent reading at 07/25/2023 11:04 AM Clinical Support from 02/27/2023 in [facility] Most recent reading at 02/27/2023  3:04 PM  PHQ-2 Total Score 0 6 0 3 0  PHQ-9 Total Score 4 18 0 14 0   Flowsheet Row Office Visit from 03/02/2024 in [facility] at [place] ED from 05/30/2023 in [facility] Emergency Department at [facility] ED from 01/15/2023 in [facility] at [facility]  C-SSRS RISK CATEGORY Error: Question 6 not populated No Risk No Risk     Assessment and Plan:  Assessment: Patient seen and examined as noted above. Summary: Today Marc Hart appears to be doing well.  He reports Invega  Marc Hart is wearing off a month prior to next scheduled dose.  Reports sleep is okay trazodone  usually works for him but finds himself waking up a couple of times are harder to fall asleep couple times a week he reports overall he is doing well except for the month where the Invega  is not working.  He denies adverse reactions to medications he denies suicidal/self-harm/homicidal ideation, psychosis, paranoia, abnormal movements.  He reports he is eating without difficulty During visit he is dressed appropriate for age and weather.  He is seated comfortably in chair with no noted distress.  He is alert/oriented x 4, calm/cooperative and mood is congruent with affect.  He spoke in a clear tone at moderate volume, and normal pace, with good eye contact.  His thought process is coherent, relevant, and there is no indication that he is currently responding to internal/external stimuli or experiencing delusional thought content.    1. Paranoid schizophrenia (HCC) (Primary) - paliperidone  Palmitate ER (INVEGA  TRINZA) injection 819 mg - paliperidone  Palmitate ER (INVEGA  TRINZA) injection 819  mg  2. Insomnia, unspecified type - traZODone  (DESYREL ) 50 MG tablet; Take 1 tablet (50 mg total) by mouth at bedtime as needed and may repeat dose one time if needed. for sleep  Dispense: 180 tablet; Refill: 1  3. Generalized anxiety disorder - hydrOXYzine  (ATARAX ) 25 MG tablet; Take 1 tablet (25 mg total) by mouth 3 (three) times daily as needed for anxiety.  Dispense: 270 tablet; Refill: 1   Plan: Medications: Meds ordered this encounter  Medications   paliperidone  Palmitate ER (INVEGA  TRINZA) injection 819 mg   traZODone  (DESYREL ) 50 MG tablet    Sig: Take 1 tablet (50 mg total) by mouth at bedtime as needed and may repeat dose one time if needed. for sleep    Dispense:  180 tablet    Refill:  1    Supervising Provider:   ARFEEN, SYED T [[id number]]   hydrOXYzine  (ATARAX ) 25 MG tablet    Sig: Take 1 tablet (25 mg total) by mouth 3 (three) times daily as needed for anxiety.    Dispense:  270 tablet    Refill:  1    Supervising Provider:   CURRY PATERSON T [[id number]]   paliperidone  Palmitate ER (INVEGA  TRINZA) injection 819 mg    Labs: Previous orders for labs not done, encouraged to have blood work done.  Other:  Declined counseling/therapy at this time Marc Hart is instructed to call 911, [phone], mobile crisis, or present to the nearest emergency room should he experience any suicidal/homicidal ideation, auditory/visual/hallucinations, or detrimental worsening of his mental health condition.   Marc Hart participated in the development of this treatment plan and verbalized his understanding/agreement with plan as listed.  Follow Up: Return in 2 months for medication management Call in the interim for any side-effects, decompensation, questions, or problems  Collaboration of Care: Collaboration of Care: Medication Management AEB medication assessment, adjustment, refills  Patient/Guardian was advised Release of Information must be obtained prior to any record release in  order to collaborate their care with an outside provider. Patient/Guardian was advised if they have not already done so to contact the registration department to sign all necessary  forms in order for us  to release information regarding their care.   Consent: Patient/Guardian gives verbal consent for treatment and assignment of benefits for services provided during this visit. Patient/Guardian expressed understanding and agreed to proceed.    Willis Kuipers, NP 06/15/2024, 12:11 PM

## 2024-06-15 NOTE — Patient Instructions (Signed)

## 2024-07-02 ENCOUNTER — Telehealth (HOSPITAL_COMMUNITY): Payer: Self-pay

## 2024-07-02 ENCOUNTER — Other Ambulatory Visit (HOSPITAL_COMMUNITY): Payer: Self-pay | Admitting: Registered Nurse

## 2024-07-02 DIAGNOSIS — F411 Generalized anxiety disorder: Secondary | ICD-10-CM

## 2024-07-02 MED ORDER — HYDROXYZINE HCL 25 MG PO TABS: 60.0000 mg | ORAL_TABLET | Freq: Three times a day (TID) | ORAL | 1 refills | Status: DC | PRN

## 2024-07-02 NOTE — Telephone Encounter (Signed)
 Pt called in requesting a refill on his hydroxyzine  it was sent to [facility] but [facility] [place]. [facility] [place]

## 2024-07-02 NOTE — Telephone Encounter (Signed)
Called pt no answer left vm to check pharmacy 

## 2024-08-10 ENCOUNTER — Ambulatory Visit (HOSPITAL_COMMUNITY): Admitting: Registered Nurse

## 2024-08-10 ENCOUNTER — Other Ambulatory Visit (HOSPITAL_COMMUNITY): Payer: Self-pay | Admitting: Registered Nurse

## 2024-08-10 MED ORDER — PALIPERIDONE PALMITATE ER 819 MG/2.63ML IM SUSY: 819.0000 mg | PREFILLED_SYRINGE | INTRAMUSCULAR | 1 refills | Status: AC

## 2024-08-10 MED ORDER — PALIPERIDONE PALMITATE ER 819 MG/2.63ML IM SUSY: 819.0000 mg | PREFILLED_SYRINGE | INTRAMUSCULAR | 1 refills | Status: DC

## 2024-08-12 ENCOUNTER — Ambulatory Visit (HOSPITAL_COMMUNITY): Payer: MEDICAID | Admitting: Psychiatry

## 2024-08-17 ENCOUNTER — Ambulatory Visit (HOSPITAL_COMMUNITY): Payer: Self-pay | Admitting: *Deleted

## 2024-08-17 VITALS — BP 116/69 | HR 81 | Ht 70.25 in | Wt 245.2 lb

## 2024-08-17 DIAGNOSIS — F200 Paranoid schizophrenia: Secondary | ICD-10-CM

## 2024-08-17 NOTE — Patient Instructions (Addendum)
Follow up in 4 week

## 2024-08-17 NOTE — Progress Notes (Cosign Needed Addendum)
 Patient came into office to get his injection for Invega  Trinza 819mg . Patient came in on Thursday December 11th to get his injection but the pharmacy had given him the wrong dose. Staff called insurance to get a Prior Auth approved and called Pharmacy for them to order the correct dose for patient. Per pharmacy it takes approx 2 days for them to get medication. Patient came in on Monday 08/17/2024 to get his injection. Injection was given in patient Right Deltoid. Patient did not have any previous complaints or present complaints. Patient was observed after his injection to make sure he was fine before leaving.   NDC # [id number]  LOT #  [id number]  EXPIRATION DATE 10/2025

## 2024-08-25 NOTE — Progress Notes (Cosign Needed)
 Can you please co sign this encounter

## 2024-09-04 ENCOUNTER — Telehealth (HOSPITAL_COMMUNITY): Payer: Self-pay | Admitting: *Deleted

## 2024-09-04 NOTE — Telephone Encounter (Signed)
 Patient called stating he is needing refills for his Trazodone  due to almost being out. Patient did not say on voicemail which pharmacy he wanted script to go to. Staff called patient on the number he provided on voicemail. Number patient left on voicemail was [phone]. Staff lmom

## 2024-09-07 DIAGNOSIS — G4700 Insomnia, unspecified: Secondary | ICD-10-CM

## 2024-09-07 MED ORDER — TRAZODONE HCL 50 MG PO TABS: 50.0000 mg | ORAL_TABLET | Freq: Every evening | ORAL | 1 refills | Status: DC | PRN

## 2024-09-24 ENCOUNTER — Telehealth (HOSPITAL_COMMUNITY): Payer: Self-pay

## 2024-09-24 NOTE — Telephone Encounter (Signed)
 [facility] in [place] sent over a 90 day refill request for pt's Hydroxyzine  HCL 25 MG Tablet. Pt scheduled 11/09/24. Please advise,

## 2024-09-28 ENCOUNTER — Other Ambulatory Visit (HOSPITAL_COMMUNITY): Payer: Self-pay | Admitting: Registered Nurse

## 2024-09-28 DIAGNOSIS — F411 Generalized anxiety disorder: Secondary | ICD-10-CM

## 2024-09-28 MED ORDER — HYDROXYZINE HCL 25 MG PO TABS: 25.0000 mg | ORAL_TABLET | Freq: Three times a day (TID) | ORAL | 1 refills | Status: AC | PRN

## 2024-10-01 ENCOUNTER — Other Ambulatory Visit (HOSPITAL_COMMUNITY): Payer: Self-pay | Admitting: Registered Nurse

## 2024-10-01 DIAGNOSIS — G4700 Insomnia, unspecified: Secondary | ICD-10-CM

## 2024-10-01 DIAGNOSIS — F200 Paranoid schizophrenia: Secondary | ICD-10-CM

## 2024-10-01 MED ORDER — PALIPERIDONE ER 6 MG PO TB24: 6.0000 mg | ORAL_TABLET | Freq: Every day | ORAL | 1 refills | Status: AC

## 2024-10-01 MED ORDER — TRAZODONE HCL 50 MG PO TABS: 50.0000 mg | ORAL_TABLET | Freq: Every evening | ORAL | 1 refills | Status: AC | PRN

## 2024-10-01 NOTE — Telephone Encounter (Signed)
 You told him to follow up the same time he comes in for his injection during his last visit.

## 2024-10-01 NOTE — Telephone Encounter (Signed)
 Patient trying to get his [facility]one  [facility]. Per pt he did not sleep good last night. Per pt he's been having some vivid dreams like dead dogs and the characters from lord of the rings. Per pt he's also been talking really fast lately. Per pt he's had some friends pass away.   But patient stated he needs his T[facility]odone  [facility] [place].

## 2024-10-05 NOTE — Telephone Encounter (Signed)
 Patient did not decline in scheduling appt. Called patient to inform him with what provider stated and to verify he received his medication but was not able to reach him. No voicemail.

## 2024-10-08 ENCOUNTER — Telehealth (HOSPITAL_COMMUNITY): Payer: Self-pay | Admitting: *Deleted

## 2024-10-08 NOTE — Telephone Encounter (Signed)
 Patient called and left message on voicemail. Per pt his medication is not working. Per pt he is not sleeping well and he feels like someone is trying to kill him. Per pt he's having vivid dreams that are not good. Per pt he would like for staff to call him back.    Staff tried calling him back several times and was not able to reach patient. There was no voicemail on both numbers on file. After a while of ringing, Pre recording keeps saying call can not be completed as dial.

## 2024-11-09 ENCOUNTER — Ambulatory Visit (HOSPITAL_COMMUNITY): Payer: Self-pay | Admitting: Registered Nurse

## 2024-11-09 ENCOUNTER — Ambulatory Visit (HOSPITAL_COMMUNITY): Admitting: Registered Nurse
